# Patient Record
Sex: Female | Born: 1945 | Race: White | Marital: Single | Smoking: Never smoker
Health system: Southern US, Community
[De-identification: ages and names within clinical notes are randomized; demographics above are authoritative.]

## PROBLEM LIST (undated history)

## (undated) DIAGNOSIS — Z808 Family history of malignant neoplasm of other organs or systems: Secondary | ICD-10-CM

## (undated) DIAGNOSIS — Z801 Family history of malignant neoplasm of trachea, bronchus and lung: Secondary | ICD-10-CM

## (undated) DIAGNOSIS — E079 Disorder of thyroid, unspecified: Secondary | ICD-10-CM

## (undated) DIAGNOSIS — C20 Malignant neoplasm of rectum: Secondary | ICD-10-CM

## (undated) DIAGNOSIS — Z800 Family history of malignant neoplasm of digestive organs: Secondary | ICD-10-CM

## (undated) HISTORY — DX: Family history of malignant neoplasm of other organs or systems: Z80.8

## (undated) HISTORY — DX: Family history of malignant neoplasm of digestive organs: Z80.0

## (undated) HISTORY — DX: Disorder of thyroid, unspecified: E07.9

## (undated) HISTORY — PX: TONSILLECTOMY: SUR1361

## (undated) HISTORY — DX: Family history of malignant neoplasm of trachea, bronchus and lung: Z80.1

---

## 2015-09-23 DIAGNOSIS — E039 Hypothyroidism, unspecified: Secondary | ICD-10-CM | POA: Diagnosis not present

## 2015-09-23 DIAGNOSIS — Z79899 Other long term (current) drug therapy: Secondary | ICD-10-CM | POA: Diagnosis not present

## 2015-09-28 DIAGNOSIS — M545 Low back pain: Secondary | ICD-10-CM | POA: Diagnosis not present

## 2015-09-28 DIAGNOSIS — M9902 Segmental and somatic dysfunction of thoracic region: Secondary | ICD-10-CM | POA: Diagnosis not present

## 2015-09-28 DIAGNOSIS — M542 Cervicalgia: Secondary | ICD-10-CM | POA: Diagnosis not present

## 2015-09-28 DIAGNOSIS — M9903 Segmental and somatic dysfunction of lumbar region: Secondary | ICD-10-CM | POA: Diagnosis not present

## 2015-09-28 DIAGNOSIS — M9901 Segmental and somatic dysfunction of cervical region: Secondary | ICD-10-CM | POA: Diagnosis not present

## 2015-09-28 DIAGNOSIS — M546 Pain in thoracic spine: Secondary | ICD-10-CM | POA: Diagnosis not present

## 2015-09-30 DIAGNOSIS — M9902 Segmental and somatic dysfunction of thoracic region: Secondary | ICD-10-CM | POA: Diagnosis not present

## 2015-09-30 DIAGNOSIS — M545 Low back pain: Secondary | ICD-10-CM | POA: Diagnosis not present

## 2015-09-30 DIAGNOSIS — M9903 Segmental and somatic dysfunction of lumbar region: Secondary | ICD-10-CM | POA: Diagnosis not present

## 2015-09-30 DIAGNOSIS — M546 Pain in thoracic spine: Secondary | ICD-10-CM | POA: Diagnosis not present

## 2015-09-30 DIAGNOSIS — E039 Hypothyroidism, unspecified: Secondary | ICD-10-CM | POA: Diagnosis not present

## 2015-09-30 DIAGNOSIS — M542 Cervicalgia: Secondary | ICD-10-CM | POA: Diagnosis not present

## 2015-09-30 DIAGNOSIS — M9901 Segmental and somatic dysfunction of cervical region: Secondary | ICD-10-CM | POA: Diagnosis not present

## 2015-10-05 DIAGNOSIS — M9901 Segmental and somatic dysfunction of cervical region: Secondary | ICD-10-CM | POA: Diagnosis not present

## 2015-10-05 DIAGNOSIS — M542 Cervicalgia: Secondary | ICD-10-CM | POA: Diagnosis not present

## 2015-10-05 DIAGNOSIS — M545 Low back pain: Secondary | ICD-10-CM | POA: Diagnosis not present

## 2015-10-05 DIAGNOSIS — M9903 Segmental and somatic dysfunction of lumbar region: Secondary | ICD-10-CM | POA: Diagnosis not present

## 2015-10-05 DIAGNOSIS — M546 Pain in thoracic spine: Secondary | ICD-10-CM | POA: Diagnosis not present

## 2015-10-05 DIAGNOSIS — M9902 Segmental and somatic dysfunction of thoracic region: Secondary | ICD-10-CM | POA: Diagnosis not present

## 2015-10-07 DIAGNOSIS — M9903 Segmental and somatic dysfunction of lumbar region: Secondary | ICD-10-CM | POA: Diagnosis not present

## 2015-10-07 DIAGNOSIS — M9901 Segmental and somatic dysfunction of cervical region: Secondary | ICD-10-CM | POA: Diagnosis not present

## 2015-10-07 DIAGNOSIS — M546 Pain in thoracic spine: Secondary | ICD-10-CM | POA: Diagnosis not present

## 2015-10-07 DIAGNOSIS — M542 Cervicalgia: Secondary | ICD-10-CM | POA: Diagnosis not present

## 2015-10-07 DIAGNOSIS — M9902 Segmental and somatic dysfunction of thoracic region: Secondary | ICD-10-CM | POA: Diagnosis not present

## 2015-10-07 DIAGNOSIS — M545 Low back pain: Secondary | ICD-10-CM | POA: Diagnosis not present

## 2015-10-12 DIAGNOSIS — M9901 Segmental and somatic dysfunction of cervical region: Secondary | ICD-10-CM | POA: Diagnosis not present

## 2015-10-12 DIAGNOSIS — M9902 Segmental and somatic dysfunction of thoracic region: Secondary | ICD-10-CM | POA: Diagnosis not present

## 2015-10-12 DIAGNOSIS — M545 Low back pain: Secondary | ICD-10-CM | POA: Diagnosis not present

## 2015-10-12 DIAGNOSIS — M546 Pain in thoracic spine: Secondary | ICD-10-CM | POA: Diagnosis not present

## 2015-10-12 DIAGNOSIS — M542 Cervicalgia: Secondary | ICD-10-CM | POA: Diagnosis not present

## 2015-10-12 DIAGNOSIS — M9903 Segmental and somatic dysfunction of lumbar region: Secondary | ICD-10-CM | POA: Diagnosis not present

## 2015-10-14 DIAGNOSIS — M546 Pain in thoracic spine: Secondary | ICD-10-CM | POA: Diagnosis not present

## 2015-10-14 DIAGNOSIS — M9902 Segmental and somatic dysfunction of thoracic region: Secondary | ICD-10-CM | POA: Diagnosis not present

## 2015-10-14 DIAGNOSIS — M542 Cervicalgia: Secondary | ICD-10-CM | POA: Diagnosis not present

## 2015-10-14 DIAGNOSIS — M9903 Segmental and somatic dysfunction of lumbar region: Secondary | ICD-10-CM | POA: Diagnosis not present

## 2015-10-14 DIAGNOSIS — M545 Low back pain: Secondary | ICD-10-CM | POA: Diagnosis not present

## 2015-10-14 DIAGNOSIS — M9901 Segmental and somatic dysfunction of cervical region: Secondary | ICD-10-CM | POA: Diagnosis not present

## 2015-10-19 DIAGNOSIS — M542 Cervicalgia: Secondary | ICD-10-CM | POA: Diagnosis not present

## 2015-10-19 DIAGNOSIS — M546 Pain in thoracic spine: Secondary | ICD-10-CM | POA: Diagnosis not present

## 2015-10-19 DIAGNOSIS — M9901 Segmental and somatic dysfunction of cervical region: Secondary | ICD-10-CM | POA: Diagnosis not present

## 2015-10-19 DIAGNOSIS — M545 Low back pain: Secondary | ICD-10-CM | POA: Diagnosis not present

## 2015-10-19 DIAGNOSIS — M9902 Segmental and somatic dysfunction of thoracic region: Secondary | ICD-10-CM | POA: Diagnosis not present

## 2015-10-19 DIAGNOSIS — M9903 Segmental and somatic dysfunction of lumbar region: Secondary | ICD-10-CM | POA: Diagnosis not present

## 2015-10-21 DIAGNOSIS — M9903 Segmental and somatic dysfunction of lumbar region: Secondary | ICD-10-CM | POA: Diagnosis not present

## 2015-10-21 DIAGNOSIS — M9902 Segmental and somatic dysfunction of thoracic region: Secondary | ICD-10-CM | POA: Diagnosis not present

## 2015-10-21 DIAGNOSIS — M545 Low back pain: Secondary | ICD-10-CM | POA: Diagnosis not present

## 2015-10-21 DIAGNOSIS — M542 Cervicalgia: Secondary | ICD-10-CM | POA: Diagnosis not present

## 2015-10-21 DIAGNOSIS — M9901 Segmental and somatic dysfunction of cervical region: Secondary | ICD-10-CM | POA: Diagnosis not present

## 2015-10-21 DIAGNOSIS — M546 Pain in thoracic spine: Secondary | ICD-10-CM | POA: Diagnosis not present

## 2015-10-25 DIAGNOSIS — M9903 Segmental and somatic dysfunction of lumbar region: Secondary | ICD-10-CM | POA: Diagnosis not present

## 2015-10-25 DIAGNOSIS — M545 Low back pain: Secondary | ICD-10-CM | POA: Diagnosis not present

## 2015-10-25 DIAGNOSIS — M9902 Segmental and somatic dysfunction of thoracic region: Secondary | ICD-10-CM | POA: Diagnosis not present

## 2015-10-25 DIAGNOSIS — M542 Cervicalgia: Secondary | ICD-10-CM | POA: Diagnosis not present

## 2015-10-25 DIAGNOSIS — M546 Pain in thoracic spine: Secondary | ICD-10-CM | POA: Diagnosis not present

## 2015-10-25 DIAGNOSIS — M9901 Segmental and somatic dysfunction of cervical region: Secondary | ICD-10-CM | POA: Diagnosis not present

## 2015-11-02 DIAGNOSIS — M545 Low back pain: Secondary | ICD-10-CM | POA: Diagnosis not present

## 2015-11-02 DIAGNOSIS — M9903 Segmental and somatic dysfunction of lumbar region: Secondary | ICD-10-CM | POA: Diagnosis not present

## 2015-11-02 DIAGNOSIS — M9901 Segmental and somatic dysfunction of cervical region: Secondary | ICD-10-CM | POA: Diagnosis not present

## 2015-11-02 DIAGNOSIS — M546 Pain in thoracic spine: Secondary | ICD-10-CM | POA: Diagnosis not present

## 2015-11-02 DIAGNOSIS — M542 Cervicalgia: Secondary | ICD-10-CM | POA: Diagnosis not present

## 2015-11-02 DIAGNOSIS — M9902 Segmental and somatic dysfunction of thoracic region: Secondary | ICD-10-CM | POA: Diagnosis not present

## 2015-11-04 DIAGNOSIS — M542 Cervicalgia: Secondary | ICD-10-CM | POA: Diagnosis not present

## 2015-11-04 DIAGNOSIS — M546 Pain in thoracic spine: Secondary | ICD-10-CM | POA: Diagnosis not present

## 2015-11-04 DIAGNOSIS — M9901 Segmental and somatic dysfunction of cervical region: Secondary | ICD-10-CM | POA: Diagnosis not present

## 2015-11-04 DIAGNOSIS — M9903 Segmental and somatic dysfunction of lumbar region: Secondary | ICD-10-CM | POA: Diagnosis not present

## 2015-11-04 DIAGNOSIS — M545 Low back pain: Secondary | ICD-10-CM | POA: Diagnosis not present

## 2015-11-04 DIAGNOSIS — M9902 Segmental and somatic dysfunction of thoracic region: Secondary | ICD-10-CM | POA: Diagnosis not present

## 2015-11-09 DIAGNOSIS — M546 Pain in thoracic spine: Secondary | ICD-10-CM | POA: Diagnosis not present

## 2015-11-09 DIAGNOSIS — M9902 Segmental and somatic dysfunction of thoracic region: Secondary | ICD-10-CM | POA: Diagnosis not present

## 2015-11-09 DIAGNOSIS — M542 Cervicalgia: Secondary | ICD-10-CM | POA: Diagnosis not present

## 2015-11-09 DIAGNOSIS — M545 Low back pain: Secondary | ICD-10-CM | POA: Diagnosis not present

## 2015-11-09 DIAGNOSIS — M9901 Segmental and somatic dysfunction of cervical region: Secondary | ICD-10-CM | POA: Diagnosis not present

## 2015-11-09 DIAGNOSIS — M9903 Segmental and somatic dysfunction of lumbar region: Secondary | ICD-10-CM | POA: Diagnosis not present

## 2015-11-16 DIAGNOSIS — M542 Cervicalgia: Secondary | ICD-10-CM | POA: Diagnosis not present

## 2015-11-16 DIAGNOSIS — M9902 Segmental and somatic dysfunction of thoracic region: Secondary | ICD-10-CM | POA: Diagnosis not present

## 2015-11-16 DIAGNOSIS — M546 Pain in thoracic spine: Secondary | ICD-10-CM | POA: Diagnosis not present

## 2015-11-16 DIAGNOSIS — M9903 Segmental and somatic dysfunction of lumbar region: Secondary | ICD-10-CM | POA: Diagnosis not present

## 2015-11-16 DIAGNOSIS — M9901 Segmental and somatic dysfunction of cervical region: Secondary | ICD-10-CM | POA: Diagnosis not present

## 2015-11-16 DIAGNOSIS — M545 Low back pain: Secondary | ICD-10-CM | POA: Diagnosis not present

## 2015-11-23 DIAGNOSIS — M542 Cervicalgia: Secondary | ICD-10-CM | POA: Diagnosis not present

## 2015-11-23 DIAGNOSIS — M9903 Segmental and somatic dysfunction of lumbar region: Secondary | ICD-10-CM | POA: Diagnosis not present

## 2015-11-23 DIAGNOSIS — M9901 Segmental and somatic dysfunction of cervical region: Secondary | ICD-10-CM | POA: Diagnosis not present

## 2015-11-23 DIAGNOSIS — M545 Low back pain: Secondary | ICD-10-CM | POA: Diagnosis not present

## 2015-11-23 DIAGNOSIS — M546 Pain in thoracic spine: Secondary | ICD-10-CM | POA: Diagnosis not present

## 2015-11-23 DIAGNOSIS — M9902 Segmental and somatic dysfunction of thoracic region: Secondary | ICD-10-CM | POA: Diagnosis not present

## 2015-12-02 DIAGNOSIS — M545 Low back pain: Secondary | ICD-10-CM | POA: Diagnosis not present

## 2015-12-02 DIAGNOSIS — M9903 Segmental and somatic dysfunction of lumbar region: Secondary | ICD-10-CM | POA: Diagnosis not present

## 2015-12-02 DIAGNOSIS — M546 Pain in thoracic spine: Secondary | ICD-10-CM | POA: Diagnosis not present

## 2015-12-02 DIAGNOSIS — M9901 Segmental and somatic dysfunction of cervical region: Secondary | ICD-10-CM | POA: Diagnosis not present

## 2015-12-02 DIAGNOSIS — M542 Cervicalgia: Secondary | ICD-10-CM | POA: Diagnosis not present

## 2015-12-02 DIAGNOSIS — M9902 Segmental and somatic dysfunction of thoracic region: Secondary | ICD-10-CM | POA: Diagnosis not present

## 2015-12-09 DIAGNOSIS — M9901 Segmental and somatic dysfunction of cervical region: Secondary | ICD-10-CM | POA: Diagnosis not present

## 2015-12-09 DIAGNOSIS — M542 Cervicalgia: Secondary | ICD-10-CM | POA: Diagnosis not present

## 2015-12-16 DIAGNOSIS — M9902 Segmental and somatic dysfunction of thoracic region: Secondary | ICD-10-CM | POA: Diagnosis not present

## 2015-12-16 DIAGNOSIS — M542 Cervicalgia: Secondary | ICD-10-CM | POA: Diagnosis not present

## 2015-12-16 DIAGNOSIS — M9901 Segmental and somatic dysfunction of cervical region: Secondary | ICD-10-CM | POA: Diagnosis not present

## 2015-12-16 DIAGNOSIS — M546 Pain in thoracic spine: Secondary | ICD-10-CM | POA: Diagnosis not present

## 2015-12-30 DIAGNOSIS — M546 Pain in thoracic spine: Secondary | ICD-10-CM | POA: Diagnosis not present

## 2015-12-30 DIAGNOSIS — M542 Cervicalgia: Secondary | ICD-10-CM | POA: Diagnosis not present

## 2015-12-30 DIAGNOSIS — M9901 Segmental and somatic dysfunction of cervical region: Secondary | ICD-10-CM | POA: Diagnosis not present

## 2015-12-30 DIAGNOSIS — M9902 Segmental and somatic dysfunction of thoracic region: Secondary | ICD-10-CM | POA: Diagnosis not present

## 2016-01-04 DIAGNOSIS — E039 Hypothyroidism, unspecified: Secondary | ICD-10-CM | POA: Diagnosis not present

## 2016-01-04 DIAGNOSIS — L309 Dermatitis, unspecified: Secondary | ICD-10-CM | POA: Diagnosis not present

## 2016-01-04 DIAGNOSIS — Z23 Encounter for immunization: Secondary | ICD-10-CM | POA: Diagnosis not present

## 2016-01-04 DIAGNOSIS — E559 Vitamin D deficiency, unspecified: Secondary | ICD-10-CM | POA: Diagnosis not present

## 2016-01-10 DIAGNOSIS — M546 Pain in thoracic spine: Secondary | ICD-10-CM | POA: Diagnosis not present

## 2016-01-10 DIAGNOSIS — M9902 Segmental and somatic dysfunction of thoracic region: Secondary | ICD-10-CM | POA: Diagnosis not present

## 2016-01-10 DIAGNOSIS — M9901 Segmental and somatic dysfunction of cervical region: Secondary | ICD-10-CM | POA: Diagnosis not present

## 2016-01-10 DIAGNOSIS — M542 Cervicalgia: Secondary | ICD-10-CM | POA: Diagnosis not present

## 2016-01-25 DIAGNOSIS — M542 Cervicalgia: Secondary | ICD-10-CM | POA: Diagnosis not present

## 2016-01-25 DIAGNOSIS — M546 Pain in thoracic spine: Secondary | ICD-10-CM | POA: Diagnosis not present

## 2016-01-25 DIAGNOSIS — M9901 Segmental and somatic dysfunction of cervical region: Secondary | ICD-10-CM | POA: Diagnosis not present

## 2016-01-25 DIAGNOSIS — M9902 Segmental and somatic dysfunction of thoracic region: Secondary | ICD-10-CM | POA: Diagnosis not present

## 2016-02-08 DIAGNOSIS — M9901 Segmental and somatic dysfunction of cervical region: Secondary | ICD-10-CM | POA: Diagnosis not present

## 2016-02-08 DIAGNOSIS — M542 Cervicalgia: Secondary | ICD-10-CM | POA: Diagnosis not present

## 2016-02-08 DIAGNOSIS — M9902 Segmental and somatic dysfunction of thoracic region: Secondary | ICD-10-CM | POA: Diagnosis not present

## 2016-02-08 DIAGNOSIS — M546 Pain in thoracic spine: Secondary | ICD-10-CM | POA: Diagnosis not present

## 2016-02-24 DIAGNOSIS — M9901 Segmental and somatic dysfunction of cervical region: Secondary | ICD-10-CM | POA: Diagnosis not present

## 2016-02-24 DIAGNOSIS — M542 Cervicalgia: Secondary | ICD-10-CM | POA: Diagnosis not present

## 2016-02-24 DIAGNOSIS — M546 Pain in thoracic spine: Secondary | ICD-10-CM | POA: Diagnosis not present

## 2016-02-24 DIAGNOSIS — M9902 Segmental and somatic dysfunction of thoracic region: Secondary | ICD-10-CM | POA: Diagnosis not present

## 2016-06-27 DIAGNOSIS — E890 Postprocedural hypothyroidism: Secondary | ICD-10-CM | POA: Diagnosis not present

## 2016-06-27 DIAGNOSIS — Z0000 Encounter for general adult medical examination without abnormal findings: Secondary | ICD-10-CM | POA: Diagnosis not present

## 2016-06-28 DIAGNOSIS — M9901 Segmental and somatic dysfunction of cervical region: Secondary | ICD-10-CM | POA: Diagnosis not present

## 2016-06-28 DIAGNOSIS — M542 Cervicalgia: Secondary | ICD-10-CM | POA: Diagnosis not present

## 2016-06-28 DIAGNOSIS — M546 Pain in thoracic spine: Secondary | ICD-10-CM | POA: Diagnosis not present

## 2016-06-28 DIAGNOSIS — M9902 Segmental and somatic dysfunction of thoracic region: Secondary | ICD-10-CM | POA: Diagnosis not present

## 2016-11-20 DIAGNOSIS — Z23 Encounter for immunization: Secondary | ICD-10-CM | POA: Diagnosis not present

## 2016-11-20 DIAGNOSIS — E890 Postprocedural hypothyroidism: Secondary | ICD-10-CM | POA: Diagnosis not present

## 2016-11-30 DIAGNOSIS — Z0100 Encounter for examination of eyes and vision without abnormal findings: Secondary | ICD-10-CM | POA: Diagnosis not present

## 2016-11-30 DIAGNOSIS — H5213 Myopia, bilateral: Secondary | ICD-10-CM | POA: Diagnosis not present

## 2018-08-08 DIAGNOSIS — R5383 Other fatigue: Secondary | ICD-10-CM | POA: Diagnosis not present

## 2018-08-08 DIAGNOSIS — E559 Vitamin D deficiency, unspecified: Secondary | ICD-10-CM | POA: Diagnosis not present

## 2018-08-08 DIAGNOSIS — E039 Hypothyroidism, unspecified: Secondary | ICD-10-CM | POA: Diagnosis not present

## 2018-08-08 DIAGNOSIS — K529 Noninfective gastroenteritis and colitis, unspecified: Secondary | ICD-10-CM | POA: Diagnosis not present

## 2018-08-12 DIAGNOSIS — L821 Other seborrheic keratosis: Secondary | ICD-10-CM | POA: Diagnosis not present

## 2018-08-14 DIAGNOSIS — R197 Diarrhea, unspecified: Secondary | ICD-10-CM | POA: Diagnosis not present

## 2018-09-03 DIAGNOSIS — E039 Hypothyroidism, unspecified: Secondary | ICD-10-CM | POA: Diagnosis not present

## 2018-09-03 DIAGNOSIS — K529 Noninfective gastroenteritis and colitis, unspecified: Secondary | ICD-10-CM | POA: Diagnosis not present

## 2018-09-03 DIAGNOSIS — L821 Other seborrheic keratosis: Secondary | ICD-10-CM | POA: Diagnosis not present

## 2018-09-03 DIAGNOSIS — E559 Vitamin D deficiency, unspecified: Secondary | ICD-10-CM | POA: Diagnosis not present

## 2018-11-15 DIAGNOSIS — E559 Vitamin D deficiency, unspecified: Secondary | ICD-10-CM | POA: Diagnosis not present

## 2018-11-15 DIAGNOSIS — I10 Essential (primary) hypertension: Secondary | ICD-10-CM | POA: Diagnosis not present

## 2018-11-15 DIAGNOSIS — K529 Noninfective gastroenteritis and colitis, unspecified: Secondary | ICD-10-CM | POA: Diagnosis not present

## 2018-11-15 DIAGNOSIS — H269 Unspecified cataract: Secondary | ICD-10-CM | POA: Diagnosis not present

## 2018-11-15 DIAGNOSIS — E039 Hypothyroidism, unspecified: Secondary | ICD-10-CM | POA: Diagnosis not present

## 2018-11-15 DIAGNOSIS — K625 Hemorrhage of anus and rectum: Secondary | ICD-10-CM | POA: Diagnosis not present

## 2019-02-04 DIAGNOSIS — R634 Abnormal weight loss: Secondary | ICD-10-CM | POA: Diagnosis not present

## 2019-02-04 DIAGNOSIS — R109 Unspecified abdominal pain: Secondary | ICD-10-CM | POA: Diagnosis not present

## 2019-02-04 DIAGNOSIS — E039 Hypothyroidism, unspecified: Secondary | ICD-10-CM | POA: Diagnosis not present

## 2019-02-04 DIAGNOSIS — R7301 Impaired fasting glucose: Secondary | ICD-10-CM | POA: Diagnosis not present

## 2019-02-04 DIAGNOSIS — R1030 Lower abdominal pain, unspecified: Secondary | ICD-10-CM | POA: Diagnosis not present

## 2019-02-04 DIAGNOSIS — E785 Hyperlipidemia, unspecified: Secondary | ICD-10-CM | POA: Diagnosis not present

## 2019-02-06 ENCOUNTER — Other Ambulatory Visit: Payer: Self-pay | Admitting: Family Medicine

## 2019-02-06 DIAGNOSIS — R109 Unspecified abdominal pain: Secondary | ICD-10-CM

## 2019-02-07 ENCOUNTER — Ambulatory Visit
Admission: RE | Admit: 2019-02-07 | Discharge: 2019-02-07 | Disposition: A | Discharge status: Home / Self Care | Payer: PPO | Source: Ambulatory Visit | Attending: Family Medicine | Admitting: Family Medicine

## 2019-02-07 DIAGNOSIS — R109 Unspecified abdominal pain: Secondary | ICD-10-CM

## 2019-02-07 DIAGNOSIS — K6389 Other specified diseases of intestine: Secondary | ICD-10-CM | POA: Diagnosis not present

## 2019-02-07 MED ORDER — IOHEXOL 300 MG/ML  SOLN
100.0000 mL | Freq: Once | INTRAMUSCULAR | Status: AC | PRN
  Administered 2019-02-07: 100 mL via INTRAVENOUS

## 2019-02-10 DIAGNOSIS — R195 Other fecal abnormalities: Secondary | ICD-10-CM | POA: Diagnosis not present

## 2019-02-10 DIAGNOSIS — R634 Abnormal weight loss: Secondary | ICD-10-CM | POA: Diagnosis not present

## 2019-02-10 DIAGNOSIS — D509 Iron deficiency anemia, unspecified: Secondary | ICD-10-CM | POA: Diagnosis not present

## 2019-02-10 DIAGNOSIS — R1030 Lower abdominal pain, unspecified: Secondary | ICD-10-CM | POA: Diagnosis not present

## 2019-02-10 DIAGNOSIS — R778 Other specified abnormalities of plasma proteins: Secondary | ICD-10-CM | POA: Diagnosis not present

## 2019-02-10 DIAGNOSIS — K660 Peritoneal adhesions (postprocedural) (postinfection): Secondary | ICD-10-CM | POA: Diagnosis not present

## 2019-02-10 DIAGNOSIS — R197 Diarrhea, unspecified: Secondary | ICD-10-CM | POA: Diagnosis not present

## 2019-02-10 DIAGNOSIS — R109 Unspecified abdominal pain: Secondary | ICD-10-CM | POA: Diagnosis not present

## 2019-02-10 DIAGNOSIS — R933 Abnormal findings on diagnostic imaging of other parts of digestive tract: Secondary | ICD-10-CM | POA: Diagnosis not present

## 2019-02-11 DIAGNOSIS — C187 Malignant neoplasm of sigmoid colon: Secondary | ICD-10-CM | POA: Diagnosis not present

## 2019-02-11 DIAGNOSIS — R195 Other fecal abnormalities: Secondary | ICD-10-CM | POA: Diagnosis not present

## 2019-02-11 DIAGNOSIS — R933 Abnormal findings on diagnostic imaging of other parts of digestive tract: Secondary | ICD-10-CM | POA: Diagnosis not present

## 2019-02-11 DIAGNOSIS — R197 Diarrhea, unspecified: Secondary | ICD-10-CM | POA: Diagnosis not present

## 2019-02-11 DIAGNOSIS — K922 Gastrointestinal hemorrhage, unspecified: Secondary | ICD-10-CM | POA: Diagnosis not present

## 2019-02-11 DIAGNOSIS — C19 Malignant neoplasm of rectosigmoid junction: Secondary | ICD-10-CM | POA: Diagnosis not present

## 2019-02-14 ENCOUNTER — Other Ambulatory Visit: Payer: Self-pay | Admitting: Surgery

## 2019-02-14 DIAGNOSIS — C189 Malignant neoplasm of colon, unspecified: Secondary | ICD-10-CM

## 2019-02-17 ENCOUNTER — Ambulatory Visit
Admission: RE | Admit: 2019-02-17 | Discharge: 2019-02-17 | Disposition: A | Discharge status: Home / Self Care | Payer: PPO | Source: Ambulatory Visit | Attending: Surgery | Admitting: Surgery

## 2019-02-17 ENCOUNTER — Other Ambulatory Visit: Payer: Self-pay

## 2019-02-17 DIAGNOSIS — C189 Malignant neoplasm of colon, unspecified: Secondary | ICD-10-CM | POA: Diagnosis not present

## 2019-02-17 MED ORDER — IOPAMIDOL (ISOVUE-300) INJECTION 61%
75.0000 mL | Freq: Once | INTRAVENOUS | Status: AC | PRN
  Administered 2019-02-17: 75 mL via INTRAVENOUS

## 2019-02-18 DIAGNOSIS — C20 Malignant neoplasm of rectum: Secondary | ICD-10-CM | POA: Diagnosis not present

## 2019-02-20 ENCOUNTER — Inpatient Hospital Stay (HOSPITAL_COMMUNITY)
Admission: EM | Admit: 2019-02-20 | Discharge: 2019-03-05 | DRG: 329 | Disposition: A | Discharge status: Home Health | Payer: PPO | Attending: Surgery | Admitting: Surgery

## 2019-02-20 ENCOUNTER — Encounter (HOSPITAL_COMMUNITY): Admission: EM | Disposition: A | Discharge status: Home Health | Payer: Self-pay | Source: Home / Self Care

## 2019-02-20 ENCOUNTER — Encounter (HOSPITAL_COMMUNITY): Payer: Self-pay | Admitting: *Deleted

## 2019-02-20 ENCOUNTER — Other Ambulatory Visit: Payer: Self-pay | Admitting: Surgery

## 2019-02-20 ENCOUNTER — Emergency Department (HOSPITAL_COMMUNITY): Payer: PPO

## 2019-02-20 ENCOUNTER — Emergency Department (HOSPITAL_COMMUNITY): Payer: PPO | Admitting: Registered Nurse

## 2019-02-20 ENCOUNTER — Other Ambulatory Visit: Payer: Self-pay

## 2019-02-20 ENCOUNTER — Telehealth: Payer: Self-pay | Admitting: Hematology

## 2019-02-20 DIAGNOSIS — E039 Hypothyroidism, unspecified: Secondary | ICD-10-CM | POA: Diagnosis not present

## 2019-02-20 DIAGNOSIS — R52 Pain, unspecified: Secondary | ICD-10-CM | POA: Diagnosis not present

## 2019-02-20 DIAGNOSIS — C20 Malignant neoplasm of rectum: Principal | ICD-10-CM | POA: Diagnosis present

## 2019-02-20 DIAGNOSIS — Z6820 Body mass index (BMI) 20.0-20.9, adult: Secondary | ICD-10-CM

## 2019-02-20 DIAGNOSIS — Z800 Family history of malignant neoplasm of digestive organs: Secondary | ICD-10-CM | POA: Diagnosis not present

## 2019-02-20 DIAGNOSIS — I959 Hypotension, unspecified: Secondary | ICD-10-CM | POA: Diagnosis not present

## 2019-02-20 DIAGNOSIS — K659 Peritonitis, unspecified: Secondary | ICD-10-CM | POA: Diagnosis not present

## 2019-02-20 DIAGNOSIS — E43 Unspecified severe protein-calorie malnutrition: Secondary | ICD-10-CM | POA: Diagnosis not present

## 2019-02-20 DIAGNOSIS — E860 Dehydration: Secondary | ICD-10-CM | POA: Diagnosis present

## 2019-02-20 DIAGNOSIS — R1310 Dysphagia, unspecified: Secondary | ICD-10-CM | POA: Diagnosis present

## 2019-02-20 DIAGNOSIS — E871 Hypo-osmolality and hyponatremia: Secondary | ICD-10-CM | POA: Diagnosis not present

## 2019-02-20 DIAGNOSIS — Z433 Encounter for attention to colostomy: Secondary | ICD-10-CM | POA: Diagnosis not present

## 2019-02-20 DIAGNOSIS — K631 Perforation of intestine (nontraumatic): Secondary | ICD-10-CM | POA: Diagnosis not present

## 2019-02-20 DIAGNOSIS — C188 Malignant neoplasm of overlapping sites of colon: Secondary | ICD-10-CM | POA: Diagnosis not present

## 2019-02-20 DIAGNOSIS — K567 Ileus, unspecified: Secondary | ICD-10-CM | POA: Diagnosis not present

## 2019-02-20 DIAGNOSIS — R198 Other specified symptoms and signs involving the digestive system and abdomen: Secondary | ICD-10-CM | POA: Diagnosis present

## 2019-02-20 DIAGNOSIS — K668 Other specified disorders of peritoneum: Secondary | ICD-10-CM | POA: Diagnosis not present

## 2019-02-20 DIAGNOSIS — R1084 Generalized abdominal pain: Secondary | ICD-10-CM | POA: Diagnosis not present

## 2019-02-20 DIAGNOSIS — R000 Tachycardia, unspecified: Secondary | ICD-10-CM | POA: Diagnosis not present

## 2019-02-20 HISTORY — PX: LAPAROTOMY: SHX154

## 2019-02-20 HISTORY — PX: COLECTOMY WITH COLOSTOMY CREATION/HARTMANN PROCEDURE: SHX6598

## 2019-02-20 LAB — CBC WITH DIFFERENTIAL/PLATELET
Abs Immature Granulocytes: 0.16 10*3/uL — ABNORMAL HIGH (ref 0.00–0.07)
Basophils Absolute: 0.1 10*3/uL (ref 0.0–0.1)
Basophils Relative: 1 %
EOS PCT: 17 %
Eosinophils Absolute: 2.1 10*3/uL — ABNORMAL HIGH (ref 0.0–0.5)
HCT: 35.5 % — ABNORMAL LOW (ref 36.0–46.0)
Hemoglobin: 10.7 g/dL — ABNORMAL LOW (ref 12.0–15.0)
Immature Granulocytes: 1 %
Lymphocytes Relative: 3 %
Lymphs Abs: 0.4 10*3/uL — ABNORMAL LOW (ref 0.7–4.0)
MCH: 24.1 pg — ABNORMAL LOW (ref 26.0–34.0)
MCHC: 30.1 g/dL (ref 30.0–36.0)
MCV: 80 fL (ref 80.0–100.0)
Monocytes Absolute: 0.5 10*3/uL (ref 0.1–1.0)
Monocytes Relative: 4 %
Neutro Abs: 9.1 10*3/uL — ABNORMAL HIGH (ref 1.7–7.7)
Neutrophils Relative %: 74 %
Platelets: 422 10*3/uL — ABNORMAL HIGH (ref 150–400)
RBC: 4.44 MIL/uL (ref 3.87–5.11)
RDW: 14.7 % (ref 11.5–15.5)
WBC: 12.4 10*3/uL — ABNORMAL HIGH (ref 4.0–10.5)
nRBC: 0 % (ref 0.0–0.2)

## 2019-02-20 LAB — COMPREHENSIVE METABOLIC PANEL
ALT: 13 U/L (ref 0–44)
AST: 21 U/L (ref 15–41)
Albumin: 2.6 g/dL — ABNORMAL LOW (ref 3.5–5.0)
Alkaline Phosphatase: 84 U/L (ref 38–126)
Anion gap: 9 (ref 5–15)
BUN: 13 mg/dL (ref 8–23)
CO2: 24 mmol/L (ref 22–32)
Calcium: 7.9 mg/dL — ABNORMAL LOW (ref 8.9–10.3)
Chloride: 101 mmol/L (ref 98–111)
Creatinine, Ser: 0.64 mg/dL (ref 0.44–1.00)
GFR calc Af Amer: >60 mL/min (ref >60)
GFR calc non Af Amer: >60 mL/min (ref >60)
Glucose, Bld: 101 mg/dL — ABNORMAL HIGH (ref 70–99)
Potassium: 3.5 mmol/L (ref 3.5–5.1)
Sodium: 134 mmol/L — ABNORMAL LOW (ref 135–145)
Total Bilirubin: 1.4 mg/dL — ABNORMAL HIGH (ref 0.3–1.2)
Total Protein: 6.1 g/dL — ABNORMAL LOW (ref 6.5–8.1)

## 2019-02-20 LAB — TYPE AND SCREEN
ABO/RH(D): A POS
ANTIBODY SCREEN: NEGATIVE

## 2019-02-20 LAB — LACTIC ACID, PLASMA: Lactic Acid, Venous: 1.5 mmol/L (ref 0.5–1.9)

## 2019-02-20 LAB — LIPASE, BLOOD: Lipase: 24 U/L (ref 11–51)

## 2019-02-20 SURGERY — LAPAROTOMY, EXPLORATORY
Anesthesia: General | Site: Abdomen

## 2019-02-20 MED ORDER — SODIUM CHLORIDE 0.9 % IV SOLN
INTRAVENOUS | Status: DC | PRN
  Administered 2019-02-20: 25 ug/min via INTRAVENOUS

## 2019-02-20 MED ORDER — BUPIVACAINE-EPINEPHRINE (PF) 0.25% -1:200000 IJ SOLN
INTRAMUSCULAR | Status: AC
  Filled 2019-02-20: qty 30

## 2019-02-20 MED ORDER — ALBUMIN HUMAN 5 % IV SOLN
INTRAVENOUS | Status: DC | PRN
  Administered 2019-02-20: 23:00:00 via INTRAVENOUS

## 2019-02-20 MED ORDER — DEXAMETHASONE SODIUM PHOSPHATE 10 MG/ML IJ SOLN
INTRAMUSCULAR | Status: DC | PRN
  Administered 2019-02-20: 5 mg via INTRAVENOUS

## 2019-02-20 MED ORDER — FENTANYL CITRATE (PF) 250 MCG/5ML IJ SOLN
INTRAMUSCULAR | Status: AC
  Filled 2019-02-20: qty 5

## 2019-02-20 MED ORDER — SODIUM CHLORIDE (PF) 0.9 % IJ SOLN
INTRAMUSCULAR | Status: AC
  Filled 2019-02-20: qty 50

## 2019-02-20 MED ORDER — FENTANYL CITRATE (PF) 100 MCG/2ML IJ SOLN
50.0000 ug | Freq: Once | INTRAMUSCULAR | Status: AC
  Administered 2019-02-20: 50 ug via INTRAVENOUS
  Filled 2019-02-20: qty 2

## 2019-02-20 MED ORDER — IOPAMIDOL (ISOVUE-300) INJECTION 61%
INTRAVENOUS | Status: AC
  Filled 2019-02-20: qty 100

## 2019-02-20 MED ORDER — DEXAMETHASONE SODIUM PHOSPHATE 10 MG/ML IJ SOLN
INTRAMUSCULAR | Status: AC
  Filled 2019-02-20: qty 1

## 2019-02-20 MED ORDER — LACTATED RINGERS IV SOLN
INTRAVENOUS | Status: DC | PRN
  Administered 2019-02-20: 21:00:00 via INTRAVENOUS

## 2019-02-20 MED ORDER — SODIUM CHLORIDE 0.9% IV SOLUTION: Freq: Once | INTRAVENOUS | Status: DC

## 2019-02-20 MED ORDER — ACETAMINOPHEN 10 MG/ML IV SOLN
INTRAVENOUS | Status: AC
  Filled 2019-02-20: qty 100

## 2019-02-20 MED ORDER — SUCCINYLCHOLINE CHLORIDE 200 MG/10ML IV SOSY
PREFILLED_SYRINGE | INTRAVENOUS | Status: DC | PRN
  Administered 2019-02-20: 80 mg via INTRAVENOUS

## 2019-02-20 MED ORDER — FENTANYL CITRATE (PF) 100 MCG/2ML IJ SOLN: 25.0000 ug | INTRAMUSCULAR | Status: DC | PRN

## 2019-02-20 MED ORDER — MIDAZOLAM HCL 2 MG/2ML IJ SOLN
INTRAMUSCULAR | Status: AC
  Filled 2019-02-20: qty 2

## 2019-02-20 MED ORDER — ONDANSETRON HCL 4 MG/2ML IJ SOLN
INTRAMUSCULAR | Status: DC | PRN
  Administered 2019-02-20: 4 mg via INTRAVENOUS

## 2019-02-20 MED ORDER — LACTATED RINGERS IV SOLN
INTRAVENOUS | Status: DC | PRN
  Administered 2019-02-20 (×4): via INTRAVENOUS

## 2019-02-20 MED ORDER — KETAMINE HCL 10 MG/ML IJ SOLN
INTRAMUSCULAR | Status: DC | PRN
  Administered 2019-02-20: 10 mg via INTRAVENOUS
  Administered 2019-02-20: 20 mg via INTRAVENOUS

## 2019-02-20 MED ORDER — ARTIFICIAL TEARS OPHTHALMIC OINT
TOPICAL_OINTMENT | OPHTHALMIC | Status: AC
  Filled 2019-02-20: qty 3.5

## 2019-02-20 MED ORDER — ROCURONIUM BROMIDE 10 MG/ML (PF) SYRINGE
PREFILLED_SYRINGE | INTRAVENOUS | Status: DC | PRN
  Administered 2019-02-20: 10 mg via INTRAVENOUS
  Administered 2019-02-20: 50 mg via INTRAVENOUS

## 2019-02-20 MED ORDER — PHENYLEPHRINE HCL 10 MG/ML IJ SOLN
INTRAMUSCULAR | Status: DC | PRN
  Administered 2019-02-20: 80 ug via INTRAVENOUS

## 2019-02-20 MED ORDER — ONDANSETRON HCL 4 MG/2ML IJ SOLN
INTRAMUSCULAR | Status: AC
  Filled 2019-02-20: qty 2

## 2019-02-20 MED ORDER — LIDOCAINE 2% (20 MG/ML) 5 ML SYRINGE
INTRAMUSCULAR | Status: DC | PRN
  Administered 2019-02-20: 1 mg/kg/h via INTRAVENOUS

## 2019-02-20 MED ORDER — ONDANSETRON HCL 4 MG/2ML IJ SOLN
4.0000 mg | INTRAMUSCULAR | Status: AC
  Administered 2019-02-20: 4 mg via INTRAVENOUS
  Filled 2019-02-20: qty 2

## 2019-02-20 MED ORDER — PIPERACILLIN-TAZOBACTAM 3.375 G IVPB 30 MIN
3.3750 g | Freq: Once | INTRAVENOUS | Status: AC
  Administered 2019-02-20: 3.375 g via INTRAVENOUS
  Filled 2019-02-20: qty 50

## 2019-02-20 MED ORDER — FENTANYL CITRATE (PF) 100 MCG/2ML IJ SOLN
50.0000 ug | Freq: Once | INTRAMUSCULAR | Status: DC
  Filled 2019-02-20: qty 2

## 2019-02-20 MED ORDER — ROCURONIUM BROMIDE 10 MG/ML (PF) SYRINGE
PREFILLED_SYRINGE | INTRAVENOUS | Status: AC
  Filled 2019-02-20: qty 10

## 2019-02-20 MED ORDER — PIPERACILLIN-TAZOBACTAM IN DEX 2-0.25 GM/50ML IV SOLN: 2.2500 g | Freq: Four times a day (QID) | INTRAVENOUS | Status: DC

## 2019-02-20 MED ORDER — SODIUM CHLORIDE 0.9 % IV BOLUS
1000.0000 mL | Freq: Once | INTRAVENOUS | Status: AC
  Administered 2019-02-20: 1000 mL via INTRAVENOUS

## 2019-02-20 MED ORDER — MIDAZOLAM HCL 5 MG/5ML IJ SOLN
INTRAMUSCULAR | Status: DC | PRN
  Administered 2019-02-20: 1 mg via INTRAVENOUS

## 2019-02-20 MED ORDER — LIDOCAINE 2% (20 MG/ML) 5 ML SYRINGE
INTRAMUSCULAR | Status: AC
  Filled 2019-02-20: qty 5

## 2019-02-20 MED ORDER — FENTANYL CITRATE (PF) 100 MCG/2ML IJ SOLN
INTRAMUSCULAR | Status: DC | PRN
  Administered 2019-02-20 (×6): 50 ug via INTRAVENOUS
  Administered 2019-02-20 – 2019-02-21 (×2): 100 ug via INTRAVENOUS

## 2019-02-20 MED ORDER — PROPOFOL 10 MG/ML IV BOLUS
INTRAVENOUS | Status: AC
  Filled 2019-02-20: qty 20

## 2019-02-20 MED ORDER — IOPAMIDOL (ISOVUE-300) INJECTION 61%
100.0000 mL | Freq: Once | INTRAVENOUS | Status: AC | PRN
  Administered 2019-02-20: 80 mL via INTRAVENOUS

## 2019-02-20 MED ORDER — LIDOCAINE HCL 2 % IJ SOLN
INTRAMUSCULAR | Status: AC
  Filled 2019-02-20: qty 20

## 2019-02-20 MED ORDER — PHENYLEPHRINE HCL 10 MG/ML IJ SOLN
INTRAMUSCULAR | Status: AC
  Filled 2019-02-20: qty 1

## 2019-02-20 MED ORDER — KETAMINE HCL 10 MG/ML IJ SOLN
INTRAMUSCULAR | Status: AC
  Filled 2019-02-20: qty 1

## 2019-02-20 MED ORDER — ACETAMINOPHEN 10 MG/ML IV SOLN
INTRAVENOUS | Status: DC | PRN
  Administered 2019-02-20: 1000 mg via INTRAVENOUS

## 2019-02-20 MED ORDER — PROPOFOL 10 MG/ML IV BOLUS
INTRAVENOUS | Status: DC | PRN
  Administered 2019-02-20: 80 mg via INTRAVENOUS
  Administered 2019-02-20: 50 mg via INTRAVENOUS

## 2019-02-20 MED ORDER — ONDANSETRON HCL 4 MG/2ML IJ SOLN: 4.0000 mg | Freq: Once | INTRAMUSCULAR | Status: DC | PRN

## 2019-02-20 MED ORDER — 0.9 % SODIUM CHLORIDE (POUR BTL) OPTIME
TOPICAL | Status: DC | PRN
  Administered 2019-02-20: 3000 mL

## 2019-02-20 MED ORDER — SUGAMMADEX SODIUM 200 MG/2ML IV SOLN
INTRAVENOUS | Status: DC | PRN
  Administered 2019-02-20: 100 mg via INTRAVENOUS

## 2019-02-20 MED ORDER — SUCCINYLCHOLINE CHLORIDE 200 MG/10ML IV SOSY
PREFILLED_SYRINGE | INTRAVENOUS | Status: AC
  Filled 2019-02-20: qty 10

## 2019-02-20 MED ORDER — LIDOCAINE 2% (20 MG/ML) 5 ML SYRINGE
INTRAMUSCULAR | Status: DC | PRN
  Administered 2019-02-20: 60 mg via INTRAVENOUS

## 2019-02-20 MED ORDER — BUPIVACAINE-EPINEPHRINE (PF) 0.25% -1:200000 IJ SOLN
INTRAMUSCULAR | Status: DC | PRN
  Administered 2019-02-20: 30 mL

## 2019-02-20 SURGICAL SUPPLY — 50 items
BLADE EXTENDED COATED 6.5IN (ELECTRODE) ×3 IMPLANT
BNDG GAUZE ELAST 4 BULKY (GAUZE/BANDAGES/DRESSINGS) ×3 IMPLANT
CHLORAPREP W/TINT 26ML (MISCELLANEOUS) ×3 IMPLANT
COVER MAYO STAND STRL (DRAPES) ×3 IMPLANT
COVER WAND RF STERILE (DRAPES) IMPLANT
DRAIN CHANNEL 19F RND (DRAIN) ×3 IMPLANT
DRAPE LAPAROSCOPIC ABDOMINAL (DRAPES) ×3 IMPLANT
DRAPE SHEET LG 3/4 BI-LAMINATE (DRAPES) IMPLANT
DRAPE UTILITY XL STRL (DRAPES) ×3 IMPLANT
DRAPE WARM FLUID 44X44 (DRAPE) ×3 IMPLANT
DRSG PAD ABDOMINAL 8X10 ST (GAUZE/BANDAGES/DRESSINGS) ×3 IMPLANT
ELECT REM PT RETURN 15FT ADLT (MISCELLANEOUS) ×3 IMPLANT
EVACUATOR SILICONE 100CC (DRAIN) ×3 IMPLANT
GAUZE SPONGE 4X4 12PLY STRL (GAUZE/BANDAGES/DRESSINGS) ×3 IMPLANT
GLOVE ECLIPSE 8.0 STRL XLNG CF (GLOVE) ×27 IMPLANT
GLOVE INDICATOR 8.0 STRL GRN (GLOVE) ×3 IMPLANT
GOWN STRL REUS W/TWL XL LVL3 (GOWN DISPOSABLE) ×9 IMPLANT
HANDLE SUCTION POOLE (INSTRUMENTS) ×1 IMPLANT
KIT BASIN OR (CUSTOM PROCEDURE TRAY) ×3 IMPLANT
KIT TURNOVER KIT A (KITS) ×3 IMPLANT
LEGGING LITHOTOMY PAIR STRL (DRAPES) ×3 IMPLANT
LIGASURE IMPACT 36 18CM CVD LR (INSTRUMENTS) ×3 IMPLANT
PACK GENERAL/GYN (CUSTOM PROCEDURE TRAY) ×3 IMPLANT
PAD POSITIONING PINK XL (MISCELLANEOUS) ×3 IMPLANT
SPONGE LAP 18X18 RF (DISPOSABLE) IMPLANT
STAPLER CUT CVD 40MM GREEN (STAPLE) ×3 IMPLANT
STAPLER PROXIMATE 75MM BLUE (STAPLE) ×3 IMPLANT
STAPLER VISISTAT 35W (STAPLE) IMPLANT
SUCTION POOLE HANDLE (INSTRUMENTS) ×3
SUT ETHILON 2 0 PS N (SUTURE) ×3 IMPLANT
SUT ETHILON 3 0 PS 1 (SUTURE) IMPLANT
SUT NOVA 1 T20/GS 25DT (SUTURE) IMPLANT
SUT PDS AB 1 CTX 36 (SUTURE) IMPLANT
SUT PDS AB 1 TP1 96 (SUTURE) ×6 IMPLANT
SUT SILK 2 0 (SUTURE)
SUT SILK 2 0 SH CR/8 (SUTURE) IMPLANT
SUT SILK 2 0SH CR/8 30 (SUTURE) ×3 IMPLANT
SUT SILK 2-0 18XBRD TIE 12 (SUTURE) IMPLANT
SUT SILK 3 0 (SUTURE)
SUT SILK 3 0 SH CR/8 (SUTURE) IMPLANT
SUT SILK 3-0 18XBRD TIE 12 (SUTURE) IMPLANT
SUT VIC AB 0 CT1 27 (SUTURE) ×2
SUT VIC AB 0 CT1 27XBRD ANTBC (SUTURE) ×1 IMPLANT
SUT VIC AB 2-0 SH 18 (SUTURE) IMPLANT
SUT VIC AB 3-0 SH 18 (SUTURE) IMPLANT
SUT VIC AB 3-0 SH 8-18 (SUTURE) ×6 IMPLANT
TAPE CLOTH SURG 6X10 WHT LF (GAUZE/BANDAGES/DRESSINGS) ×3 IMPLANT
TOWEL OR 17X26 10 PK STRL BLUE (TOWEL DISPOSABLE) ×3 IMPLANT
TOWEL OR NON WOVEN STRL DISP B (DISPOSABLE) IMPLANT
TRAY FOLEY MTR SLVR 14FR STAT (SET/KITS/TRAYS/PACK) ×3 IMPLANT

## 2019-02-20 NOTE — ED Notes (Signed)
Bed: MB86 Expected date:  Expected time:  Means of arrival:  Comments: EMS 73 yo rectal pain/mass

## 2019-02-20 NOTE — ED Notes (Signed)
Patient return from CT

## 2019-02-20 NOTE — ED Notes (Signed)
Placed pt on Purewick. Will collect urine sample when pt voids.

## 2019-02-20 NOTE — H&P (Signed)
CC: Called by Margarita Mail, PA-C for near obstructing rectal cancer with perforation  HPI: Marie Levy is an 73 y.o. female with hx of hypothyroidism whom presented to the ED today with complaints of abdominal pain - worsened over last 24hrs. She was recently referred to our office as a referral by Dr. Benson Norway for newly diagnosed rectal cancer - near obstructing. Over the past few months, she has been following with her primary care physician who has been treating her for GI related issues including possible "colitis" and SIBO. Her symptoms persisted and she was ultimately referred for evaluation of GI. Dr. Benson Norway did a flexible sigmoidoscopy on her on 02/11/2019. He found a fungating near obstructing mass in the rectum that he was unable to traverse with the sigmoidoscope. This was circumferential. This was biopsied. On his measurement he believes masses 10 cm from anal verge. The adult endoscope was not able to traverse the lesion. Biopsies returned invasive adenocarcinoma.  CEA 2.5 CT Chest- no evidence of metastatic dz CT A/P thickening of the gastric antrum; rectal mass. Liver cysts but no evidence of metastatic disease in the abdomen or pelvis.  MRI pelvis pending  She reports having had a colonoscopy approximately 20 years ago and was told this was normal.  Over the last 24hrs her pain has changed and worsened. She felt weak. She therefore presented to the ED. Her pain is sharp and diffuse. She denies n/v. She has continued to pass gas and have liquid stool.   PMH: Hypothyoidism (takes thyroid supplements similar to Armour thyroid); denies any other known medical problems  PSH: Tonsillectomy; denies any other operations or abdominal surgeries.  FHx: She reports her mother had colon cancer and died at age of 11 for metastatic disease to her liver  Social: Denies use of tobacco/EtOH/drugs. 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  ROS: A comprehensive 10 system review of systems was completed with the patient and pertinent findings as noted above.   History reviewed. No pertinent past medical history.  Past Surgical History:  Procedure Laterality Date   TONSILLECTOMY      No family history on file.  Social:  reports that she has never smoked. She has never used smokeless tobacco. She reports previous alcohol use. She reports that she does not use drugs.  Allergies: No Known Allergies  Medications: I have reviewed the patient's current medications.  Results for orders placed or performed during the hospital encounter of 02/20/19 (from the past 48 hour(s))  CBC with Differential     Status: Abnormal   Collection Time: 02/20/19  6:20 PM  Result Value Ref Range   WBC 12.4 (H) 4.0 - 10.5 K/uL   RBC 4.44 3.87 - 5.11 MIL/uL   Hemoglobin 10.7 (L) 12.0 - 15.0 g/dL   HCT 35.5 (L) 36.0 - 46.0 %   MCV 80.0 80.0 - 100.0 fL   MCH 24.1 (L) 26.0 - 34.0 pg   MCHC 30.1 30.0 - 36.0 g/dL   RDW 14.7 11.5 - 15.5 %   Platelets 422 (H) 150 - 400 K/uL   nRBC 0.0 0.0 - 0.2 %   Neutrophils Relative % 74 %   Neutro Abs 9.1 (H) 1.7 - 7.7 K/uL   Lymphocytes Relative 3 %   Lymphs Abs 0.4 (L) 0.7 - 4.0 K/uL   Monocytes Relative 4 %   Monocytes Absolute 0.5 0.1 - 1.0 K/uL   Eosinophils Relative 17 %  Eosinophils Absolute 2.1 (H) 0.0 - 0.5 K/uL   Basophils Relative 1 %   Basophils Absolute 0.1 0.0 - 0.1 K/uL   WBC Morphology MILD LEFT SHIFT (1-5% METAS, OCC MYELO, OCC BANDS)    Immature Granulocytes 1 %   Abs Immature Granulocytes 0.16 (H) 0.00 - 0.07 K/uL    Comment: Performed at [facility], [place] [address], [place], [place] [address]  Comprehensive metabolic panel     Status: Abnormal   Collection Time: 02/20/19  6:20 PM  Result Value Ref Range   Sodium 134 (L) 135 - 145 mmol/L   Potassium 3.5 3.5 - 5.1 mmol/L   Chloride 101 98 - 111 mmol/L   CO2 24 22 - 32  mmol/L   Glucose, Bld 101 (H) 70 - 99 mg/dL   BUN 13 8 - 23 mg/dL   Creatinine, Ser 0.64 0.44 - 1.00 mg/dL   Calcium 7.9 (L) 8.9 - 10.3 mg/dL   Total Protein 6.1 (L) 6.5 - 8.1 g/dL   Albumin 2.6 (L) 3.5 - 5.0 g/dL   AST 21 15 - 41 U/L   ALT 13 0 - 44 U/L   Alkaline Phosphatase 84 38 - 126 U/L   Total Bilirubin 1.4 (H) 0.3 - 1.2 mg/dL   GFR calc non Af Amer >60 >60 mL/min   GFR calc Af Amer >60 >60 mL/min   Anion gap 9 5 - 15    Comment: Performed at [facility], [place] [address], [place], [place] [address]  Lipase, blood     Status: None   Collection Time: 02/20/19  6:20 PM  Result Value Ref Range   Lipase 24 11 - 51 U/L    Comment: Performed at [facility], [place] [address], [place], [place] [address]  Lactic acid, plasma     Status: None   Collection Time: 02/20/19  6:48 PM  Result Value Ref Range   Lactic Acid, Venous 1.5 0.5 - 1.9 mmol/L    Comment: Performed at [facility], [place] [address] [place], [place] [address]    Ct Abdomen Pelvis W Contrast  Result Date: 02/20/2019 CLINICAL DATA:  73 year old female with increasing abdominal pain. By report, mass was found on recent sigmoidoscopy. EXAM: CT ABDOMEN AND PELVIS WITH CONTRAST TECHNIQUE: Multidetector CT imaging of the abdomen and pelvis was performed using the standard protocol following bolus administration of intravenous contrast. CONTRAST:  84m ISOVUE-300 IOPAMIDOL (ISOVUE-300) INJECTION 61% COMPARISON:  CT Abdomen and Pelvis 02/07/2019. FINDINGS: Lower chest: Pneumoperitoneum is now visible under the diaphragm on series 4, image 42. dependent atelectasis. No pericardial or pleural effusion. Hepatobiliary: Small volume of free air around all margins of the liver. Round low-density areas in the liver dome appear stable and are probably benign cysts. Negative gallbladder aside from small volume of adjacent free fluid. No bile duct enlargement. Pancreas: Negative. Spleen:  Negative aside from trace free fluid. Adrenals/Urinary Tract: Normal adrenal glands. Bilateral renal enhancement is symmetric. Early bilateral renal pyramid contrast excretion. Decompressed urinary bladder. Stomach/Bowel: The severe distal large bowel stenosis in the area of masslike soft tissue thickening is redemonstrated on series 2, image 71. Just anterior to that area located in the deep pelvis to the right of midline there is a 24 x 48 by 30 millimeter extraluminal appearing air and fluid collection. See series 2 image 68 and coronal image 62. Generalized inflammatory stranding in the small and large bowel mesentery throughout the abdomen. Intermittent small volume free  fluid. Fluid-filled but mostly nondilated small bowel loops. Retained stool in the large bowel upstream of the tumor related stricture. The right colon is decompressed. Decompressed stomach. Vascular/Lymphatic: Aortoiliac calcified atherosclerosis. Major arterial structures are patent. Portal venous system is patent. Reproductive: Negative. Other: Small volume of free fluid in the anterior pelvis on series 2, image 71 with simple fluid density. Small volume of pneumoperitoneum under the lower abdominal wall at the level of the pelvic inlet on image 62. Musculoskeletal: No acute osseous abnormality identified. IMPRESSION: 1. Pneumoperitoneum indicating a perforated loop of bowel. There appears to be a 4 cm extraluminal collection of gas and fluid central in the pelvis anterior to the sigmoid colon (series 2, image 68). This is in the region of distal large bowel tumor as described on 02/07/2019. Critical Value/emergent results were called by telephone at the time of interpretation on 02/20/2019 at 1933 hours to PA-C [name] , who verbally acknowledged these results. 2. Small volume of free fluid and free air scattered in the bilateral abdomen. Generalized mesenteric inflammation. 3. No metastatic disease identified. Electronically Signed    By: Genevie Ann M.D.   On: 02/20/2019 19:43    ROS - all of the below systems have been reviewed with the patient and positives are indicated with bold text General: chills, fever or night sweats Eyes: blurry vision or double vision ENT: epistaxis or sore throat Allergy/Immunology: itchy/watery eyes or nasal congestion Hematologic/Lymphatic: bleeding problems, blood clots or swollen lymph nodes Endocrine: temperature intolerance or unexpected weight changes Breast: new or changing breast lumps or nipple discharge Resp: cough, shortness of breath, or wheezing CV: chest pain or dyspnea on exertion GI: as per HPI GU: dysuria, trouble voiding, or hematuria MSK: joint pain or joint stiffness Neuro: TIA or stroke symptoms Derm: pruritus and skin lesion changes Psych: anxiety and depression  PE Blood pressure 105/72, pulse (!) 110, temperature 98.5 F (36.9 C), temperature source Oral, resp. rate (!) 25, height 5' 6" (1.676 m), weight 56.2 kg, SpO2 97 %. Constitutional: Appears uncomfortable; conversant; no deformities Eyes: Moist conjunctiva; no lid lag; anicteric; PERRL Neck: Trachea midline; no thyromegaly Lungs: Normal respiratory effort; no tactile fremitus CV: Tachycardic rate, regular rhythm; no palpable thrills; no pitting edema GI: Abd diffusely ttp; +rebound; no palpable hepatosplenomegaly MSK: Normal extremities; no clubbing/cyanosis Psychiatric: Appropriate affect; alert and oriented x3 Lymphatic: No palpable cervical or axillary lymphadenopathy  Results for orders placed or performed during the hospital encounter of 02/20/19 (from the past 48 hour(s))  CBC with Differential     Status: Abnormal   Collection Time: 02/20/19  6:20 PM  Result Value Ref Range   WBC 12.4 (H) 4.0 - 10.5 K/uL   RBC 4.44 3.87 - 5.11 MIL/uL   Hemoglobin 10.7 (L) 12.0 - 15.0 g/dL   HCT 35.5 (L) 36.0 - 46.0 %   MCV 80.0 80.0 - 100.0 fL   MCH 24.1 (L) 26.0 - 34.0 pg   MCHC 30.1 30.0 - 36.0 g/dL   RDW  14.7 11.5 - 15.5 %   Platelets 422 (H) 150 - 400 K/uL   nRBC 0.0 0.0 - 0.2 %   Neutrophils Relative % 74 %   Neutro Abs 9.1 (H) 1.7 - 7.7 K/uL   Lymphocytes Relative 3 %   Lymphs Abs 0.4 (L) 0.7 - 4.0 K/uL   Monocytes Relative 4 %   Monocytes Absolute 0.5 0.1 - 1.0 K/uL   Eosinophils Relative 17 %   Eosinophils Absolute 2.1 (H) 0.0 -  0.5 K/uL   Basophils Relative 1 %   Basophils Absolute 0.1 0.0 - 0.1 K/uL   WBC Morphology MILD LEFT SHIFT (1-5% METAS, OCC MYELO, OCC BANDS)    Immature Granulocytes 1 %   Abs Immature Granulocytes 0.16 (H) 0.00 - 0.07 K/uL    Comment: Performed at [facility], [place] [address] [place], [place] [address]  Comprehensive metabolic panel     Status: Abnormal   Collection Time: 02/20/19  6:20 PM  Result Value Ref Range   Sodium 134 (L) 135 - 145 mmol/L   Potassium 3.5 3.5 - 5.1 mmol/L   Chloride 101 98 - 111 mmol/L   CO2 24 22 - 32 mmol/L   Glucose, Bld 101 (H) 70 - 99 mg/dL   BUN 13 8 - 23 mg/dL   Creatinine, Ser 0.64 0.44 - 1.00 mg/dL   Calcium 7.9 (L) 8.9 - 10.3 mg/dL   Total Protein 6.1 (L) 6.5 - 8.1 g/dL   Albumin 2.6 (L) 3.5 - 5.0 g/dL   AST 21 15 - 41 U/L   ALT 13 0 - 44 U/L   Alkaline Phosphatase 84 38 - 126 U/L   Total Bilirubin 1.4 (H) 0.3 - 1.2 mg/dL   GFR calc non Af Amer >60 >60 mL/min   GFR calc Af Amer >60 >60 mL/min   Anion gap 9 5 - 15    Comment: Performed at [facility], [place] [address], [place], [place] [address]  Lipase, blood     Status: None   Collection Time: 02/20/19  6:20 PM  Result Value Ref Range   Lipase 24 11 - 51 U/L    Comment: Performed at [facility], [place] [address], [place], [place] [address]  Lactic acid, plasma     Status: None   Collection Time: 02/20/19  6:48 PM  Result Value Ref Range   Lactic Acid, Venous 1.5 0.5 - 1.9 mmol/L    Comment: Performed at [facility], [place] [address] [place], [place] [address]    Ct Abdomen  Pelvis W Contrast  Result Date: 02/20/2019 CLINICAL DATA:  73 year old female with increasing abdominal pain. By report, mass was found on recent sigmoidoscopy. EXAM: CT ABDOMEN AND PELVIS WITH CONTRAST TECHNIQUE: Multidetector CT imaging of the abdomen and pelvis was performed using the standard protocol following bolus administration of intravenous contrast. CONTRAST:  25m ISOVUE-300 IOPAMIDOL (ISOVUE-300) INJECTION 61% COMPARISON:  CT Abdomen and Pelvis 02/07/2019. FINDINGS: Lower chest: Pneumoperitoneum is now visible under the diaphragm on series 4, image 42. dependent atelectasis. No pericardial or pleural effusion. Hepatobiliary: Small volume of free air around all margins of the liver. Round low-density areas in the liver dome appear stable and are probably benign cysts. Negative gallbladder aside from small volume of adjacent free fluid. No bile duct enlargement. Pancreas: Negative. Spleen: Negative aside from trace free fluid. Adrenals/Urinary Tract: Normal adrenal glands. Bilateral renal enhancement is symmetric. Early bilateral renal pyramid contrast excretion. Decompressed urinary bladder. Stomach/Bowel: The severe distal large bowel stenosis in the area of masslike soft tissue thickening is redemonstrated on series 2, image 71. Just anterior to that area located in the deep pelvis to the right of midline there is a 24 x 48 by 30 millimeter extraluminal appearing air and fluid collection. See series 2 image 68 and coronal image 62. Generalized inflammatory stranding in the small and large bowel mesentery throughout the abdomen. Intermittent small volume free fluid. Fluid-filled but mostly nondilated small bowel  loops. Retained stool in the large bowel upstream of the tumor related stricture. The right colon is decompressed. Decompressed stomach. Vascular/Lymphatic: Aortoiliac calcified atherosclerosis. Major arterial structures are patent. Portal venous system is patent. Reproductive: Negative.  Other: Small volume of free fluid in the anterior pelvis on series 2, image 71 with simple fluid density. Small volume of pneumoperitoneum under the lower abdominal wall at the level of the pelvic inlet on image 62. Musculoskeletal: No acute osseous abnormality identified. IMPRESSION: 1. Pneumoperitoneum indicating a perforated loop of bowel. There appears to be a 4 cm extraluminal collection of gas and fluid central in the pelvis anterior to the sigmoid colon (series 2, image 68). This is in the region of distal large bowel tumor as described on 02/07/2019. Critical Value/emergent results were called by telephone at the time of interpretation on 02/20/2019 at 1933 hours to PA-C [name] , who verbally acknowledged these results. 2. Small volume of free fluid and free air scattered in the bilateral abdomen. Generalized mesenteric inflammation. 3. No metastatic disease identified. Electronically Signed   By: Genevie Ann M.D.   On: 02/20/2019 19:43   A/P: SWAYZE KOZUCH is an 73 y.o. female with perforated viscus in setting of near obstructing rectal cancer - most likely has perforation at the tumor based on CT  -I met with the patient and her son [name] at bedside. We discussed all of the above. We discussed at this point, with perforation which is partially uncontained given her exam and CT findings, proceeding to OR for exploration to address the perforation. We discussed that without surgery her condition will worsen and this would almost certainly be life ending. -The anatomy and physiology of the GI tract was discussed at length with the patient. The pathophysiology of perforated viscus and cancer was discussed at length as well -We discussed exploratory laparotomy, possible Hartmann's procedure with end colostomy; possible bowel resection; possible diverting colostomy alone if not resectable primary. We also discussed possible ileostomy. We discussed the ostomy may be permanent.  -The planned  procedure, material risks (including, but not limited to, pain, bleeding, infection, scarring, need for blood transfusion, damage to surrounding structures- blood vessels/nerves/viscus/organs, damage to ureter, urine leak, leak from anastomosis, need for additional procedures, need for stoma which may be permanent, worsening of pre-existing medical conditions, DVT/PE, hernia, recurrence/persistence of her cancer, pneumonia, heart attack, stroke, death) benefits and alternatives to surgery were discussed at length. I noted a good probability that the procedure would help improve their symptoms  - 50-90%. The patient's questions and her son's were answered to their satisfaction, they voiced understanding and elected to proceed with surgery. Additionally, we discussed typical postoperative expectations and the recovery process.  Sharon Mt. [name], M.D. [facility]

## 2019-02-20 NOTE — ED Triage Notes (Signed)
Pt bib EMS and coming from home.  Pt recently had  Sigmoidoscopy and a mass was found.  Pt was told to come to the ED if she had pain.  Pt a/o x 4. Pt also reports abd pain.  EMS reports her lower abd felt hard and tenderness upon palpation.   EMS VS: BP: 92/50 (after 237ml of NaCl), HR: 105, RR: 16

## 2019-02-20 NOTE — Anesthesia Procedure Notes (Signed)
Arterial Line Insertion Start/End3/19/2020 9:18 PM, 02/20/2019 9:21 PM Performed by: Catalina Gravel, MD, anesthesiologist  Patient location: Pre-op. Preanesthetic checklist: patient identified, IV checked, site marked, risks and benefits discussed, surgical consent, monitors and equipment checked, pre-op evaluation, timeout performed and anesthesia consent Lidocaine 1% used for infiltration Left, radial was placed Catheter size: 20 Fr Hand hygiene performed  and maximum sterile barriers used   Attempts: 1 Procedure performed without using ultrasound guided technique. Following insertion, dressing applied and Biopatch. Post procedure assessment: normal and unchanged  Patient tolerated the procedure well with no immediate complications.

## 2019-02-20 NOTE — ED Notes (Signed)
Patient transported to CT 

## 2019-02-20 NOTE — ED Provider Notes (Signed)
[facility] DEPT Provider Note   CSN: [id number] Arrival date & time: 02/20/19  1700    History   Chief Complaint No chief complaint on file.   HPI BETHENE HANKINSON is a 73 y.o. female who presents emergency department chief complaint of abdominal pain.  The patient states that 1 week ago she was diagnosed with a rectal mass.  She states she had a sigmoidoscopy with Dr. Almyra Free and states that he was unable to advance the scope past the mass.  She states that since that time she has had progressively worsening general abdominal pain which she describes as constant, aching and sometimes has associated sharp cramps throughout her abdomen.  She states that Dr. Benson Norway tried to admit her last week but she declined admission and states "I really wish I had taken him up on that offer."  Patient states that she has had severe abdominal pain over the past 48 hours.  Over the past week she has had very poor oral intake and states that she has mostly been drinking water and bone broth.  She does not have an appetite.  She has had intermittent nausea.  She feels extremely weak and dehydrated.  She denies fevers or chills.  She has been able to have bowel movements.  She states that her double is mostly liquid and she is also taking MiraLAX once daily.  She was able to make a small bowel movement today.  He denies melena or hematochezia.  She denies urinary symptoms.     HPI  History reviewed. No pertinent past medical history.  There are no active problems to display for this patient.   Past Surgical History:  Procedure Laterality Date  . TONSILLECTOMY       OB History   No obstetric history on file.      Home Medications    Prior to Admission medications   Not on File    Family History No family history on file.  Social History Social History   Tobacco Use  . Smoking status: Never Smoker  . Smokeless tobacco: Never Used  Substance Use Topics  . Alcohol  use: Not Currently  . Drug use: Never     Allergies   Patient has no known allergies.   Review of Systems Review of Systems  Ten systems reviewed and are negative for acute change, except as noted in the HPI.   Physical Exam Updated Vital Signs BP 91/62 (BP Location: Right Arm)   Pulse (!) 108   Temp 98.5 F (36.9 C) (Oral)   Resp (!) 21   Ht 5\' 6"  (1.676 m)   Wt 56.2 kg   SpO2 95%   BMI 20.01 kg/m   Physical Exam Vitals signs and nursing note reviewed.  Constitutional:      General: She is not in acute distress.    Appearance: She is well-developed and underweight. She is ill-appearing. She is not diaphoretic.  HENT:     Head: Normocephalic and atraumatic.     Mouth/Throat:     Mouth: Mucous membranes are dry.  Eyes:     General: No scleral icterus.    Conjunctiva/sclera: Conjunctivae normal.  Neck:     Musculoskeletal: Normal range of motion.  Cardiovascular:     Rate and Rhythm: Normal rate and regular rhythm.     Heart sounds: Normal heart sounds. No murmur. No friction rub. No gallop.   Pulmonary:     Effort: Pulmonary effort is normal. No  respiratory distress.     Breath sounds: Normal breath sounds.  Abdominal:     General: Bowel sounds are normal. There is no distension.     Palpations: Abdomen is soft. There is no mass.     Tenderness: There is generalized abdominal tenderness. There is no guarding.  Skin:    General: Skin is warm and dry.  Neurological:     Mental Status: She is alert and oriented to person, place, and time.  Psychiatric:        Behavior: Behavior normal.      ED Treatments / Results  Labs (all labs ordered are listed, but only abnormal results are displayed) Labs Reviewed - No data to display  EKG None  Radiology No results found.  Procedures .Critical Care Performed by: Margarita Mail, PA-C Authorized by: Margarita Mail, PA-C   Critical care provider statement:    Critical care time (minutes):  45   Critical  care was necessary to treat or prevent imminent or life-threatening deterioration of the following conditions: Acute bowel perforation, surgical abdomen.   Critical care was time spent personally by me on the following activities:  Discussions with consultants, evaluation of patient's response to treatment, examination of patient, ordering and performing treatments and interventions, ordering and review of laboratory studies, ordering and review of radiographic studies, pulse oximetry, re-evaluation of patient's condition, obtaining history from patient or surrogate and review of old charts   (including critical care time)  Medications Ordered in ED Medications - No data to display   Initial Impression / Assessment and Plan / ED Course  I have reviewed the triage vital signs and the nursing notes.  Pertinent labs & imaging results that were available during my care of the patient were reviewed by me and considered in my medical decision making (see chart for details).        CC: Abdominal pain VS: BP 102/65   Pulse (!) 104   Temp 98.5 F (36.9 C) (Oral)   Resp (!) 23   Ht 5\' 6"  (1.676 m)   Wt 56.2 kg   SpO2 100%   BMI 20.01 kg/m  YS:AYTKZSW is gathered by the patient and review of EMR and her son who is at bedside. DDX: The causes for generalized abdominal pain include but are not limited to gastritis, gastroenteritis, IBS, pancreatitis, peritonitis, intestinal ischemia, constipation, UTI, intestinal obstruction, perforated viscus, eg, peptic ulcer, appendix, gallbladder, diverticulitis, physical or sexual abuse, abdominal abscess,  ruptured spleen, AAA, diabetic ketoacidosis, hypercalcemia, uremia, parasitic or other infection, eg: tapeworms, flukes, Giargia, Cryto, Yersinia, adrenal insufficiency,lead poisoning, iron toxicity, polyarteritis nodosa, Henoch-Schnlein purpura, porphyria, eg, acute intermittent porphyria, familial Mediterranean fever. Labs: I reviewed the labs which show  leukocytosis with left shift., normocytic anemia, elevated platelet count, mild eosinophilia and lymphopenia.  Patient does not have an elevated lactic acid I do not think she is septic.  Lipase is normal.  She has mild hyponatremia.  Calcium and albumin levels are also mildly low.  Urine has not resulted. Imaging: I personally reviewed the images (CT abdomen and pelvis with contrast.) which show(s) Pneumoperitoneum and perforated loop of bowel.  I was contacted emergently by Dr. Nevada Crane about these results and we discussed findings of the CT scan. EKG: n/a MDM: Patient with acute bowel and per perforation and pneumoperitoneum.  I discussed the findings with Dr. [name] from surgery who will admit the patient for emergent surgery.  Patient given IV Zosyn.  Patient disposition: Admission Patient condition: Pt stable  in ED with no significant deterioration in condition.      Final Clinical Impressions(s) / ED Diagnoses   Final diagnoses:  Bowel perforation [facility])    ED Discharge Orders    None       Margarita Mail, PA-C 02/20/19 2349    Quintella Reichert, MD 02/22/19 1520

## 2019-02-20 NOTE — Op Note (Addendum)
02/20/2019  11:49 PM  PATIENT:  Marie Levy  73 y.o. female  Patient Care Team: [name], MD as PCP - General (Family Medicine)  PRE-OPERATIVE DIAGNOSIS: Perforated rectal cancer  POST-OPERATIVE DIAGNOSIS:  Same  PROCEDURE:   1. Exploratory laparotomy 2. Low anterior resection 3. End colostomy  SURGEON:  Nadeen Landau, MD  ASSISTANT: Coralie Keens, MD  ANESTHESIA:   general  COUNTS:  Sponge, needle and instrument counts were reported correct x2 at the conclusion of the operation.  EBL: 150 mL  DRAINS: 19 Fr round blake drain left draining pelvis  SPECIMEN: Rectosigmoid colon; open end distal  COMPLICATIONS: None apparent  FINDINGS: Purulent peritoneal fluid upon entry.  Mid rectal cancer with perforation on the right anterior lateral wall involving over half the circumference.  There was a large associated abscess cavity containing pus.  The distal ileum was adherent to this abscess cavity.  The mesentery along the distal ileum had significant fibrinous exudate coating it and a small rent within it.  The ileum at this level appeared clearly viable without any evidence of perforation.  The cecum was not significantly dilated and appeared normal.  The appendix appeared normal.  The ascending, transverse, descending colon appeared normal.  The liver surface felt smooth.  The uterus and both adnexa appeared normal.  Given these findings, the decision was made to proceed with low anterior resection as the perforation was quite large.  All tissues around this perforation were thickened with woody-like edema.  It was quite difficult to distinguish tumor from inflamed tissue.  With just manipulation of the rectum, the remaining tumor fractured - this was therefore completed sharply. The stump was then oversewn and drained. An end colostomy was fashioned using well perfused proximal sigmoid colon.  DISPOSITION: PACU in satisfactory condition  INDICATION: Marie Levy is an 73 y.o. female with hx of hypothyroidism who was undergoing work-up for a near obstructing locally advanced mid rectal cancer.  She just had a colonoscopy done 02/11/2019 with Dr. Benson Norway which demonstrated this mass.  He was unable to traverse it.  She was seen in follow-up in underwent staging evaluation.  She was still passing gas and having bowel movements. She presented to the emergency room earlier today with a rather acute onset of worsening abdominal pain and on work-up was found to have a likely perforated rectal cancer with an associated abscess cavity, pneumoperitoneum, and peritonitis.  Options were discussed and she opted to pursue surgery.  Please refer to notes dictated elsewhere for details regarding this discussion  DESCRIPTION: The patient was identified in preop holding and taken to the OR where she was placed on the operating room table. SCDs were placed. General endotracheal anesthesia was induced without difficulty. A foley catheter was placed under sterile conditions. She was then positioned in lithotomy using Allen stirrups.  Pressure points were then padded. She was then prepped and draped in the usual sterile fashion. A surgical timeout was performed indicating the correct patient, procedure, positioning and need for preoperative antibiotics.   A low midline incision was created with a scalpel.  Subcutaneous tissue was then divided using electrocautery.  The midline fascia was identified and incised.  The peritoneum was grasped between clamps and entered sharply.  Upon entry, purulent material was exuding from the incision.  This was evacuated.  A Balfour retractor was placed.  Small bowel was swept out of the pelvis and a loop of distal ileum was identified and adherent to the abscess  cavity which was present in her pelvis.  There was a rent in the mesentery associated with the segment of bowel but the distal ileum itself appeared viable and without injury.  The sigmoid colon  was mobilized off of the intersigmoid fossa.  Laterally the left ureter was identified and swept away from the field of dissection.  Working of the pelvis, along the right anterior lateral wall of the rectum at the peritoneal reflection there was a clear perforation.  At least half the circumference of the rectum was involved with this perforation.  Given these findings, the decision was made to proceed with a low anterior resection.  I went below and completed another digital rectal exam as well as a vaginal exam to confirm anatomy.  There was significant inflammatory rind around the level of the perforation making distinguishing between what is tumor and inflamed tissue quite challenging.  The space posterior to the rectum appeared to be the least involved area.  The sigmoid mesentery was again elevated anteriorly and the IMA pedicle was identified.  The left ureter was confirmed to be down and well away from our field of dissection.  This was then ligated using the LigaSure.  Hemostasis was verified.  The space tween the fascia propria the rectum and the presacral fascia was developed in the avascular TME plane.  At the level of the tumor this became less distinct related to the perforation.  Posteriorly, dissection was carried below the tumor.  With just manipulation of the specimen, fracturing was occurring.  At a level it appeared to be below the tumor, the remaining wall of the rectum was divided.  Anteriorly there was not any clear tumor remaining behind. There was significant burden of inflamed/thickened tissue however. The rectal stump was oversewn with interrupted 3-0 silk suture.  There is no visible lumen after oversewing this.  The pelvis was reinspected and hemostasis was noted to be achieved.  The location of the planned colostomy was identified on a pink and well-perfused segment of proximal sigmoid colon.  The colon at this level was divided using a GIA blue load stapler.  The specimen was passed  off.   The stomach was palpated and felt smooth.  The NG tube was confirmed to be in appropriate position.  The small bowel was run from the ligament of Treitz to the ileocecal valve, twice.  No other concerning areas were identified.  The segment of distal ileum again appeared viable without any evidence of injury to the ileum itself.  The colon was reinspected and normal in appearance.  No other masses were palpated in the lumen of the colon.  The abdomen and pelvis were copiously irrigated with warm saline until the effluent ran clear.  A stab incision was created in the right lower quadrant and a 19 French round Blake drain was placed draining the rectal stump.  Attention was turned to creating the colostomy.  The fascia was grasped with Kocher clamps and the location determined to be just inferolateral to the umbilicus given her habitus.  A skin disc was incised and subcutaneous tissue removed.  The fascia was incised and the rectus muscle spread.  The anterior sheath was incised.  The colostomy was then passed through the stoma site.  The pelvis was reinspected and hemostasis verified.  The colostomy was reinspected and orientation confirmed such that there is no twisting.  Sponge, needle, and instrument counts were reported correct x2.  Attention was turned to closing the abdomen.  The  midline fascia was approximated using 2 running #1 looped PDS sutures.  Local anesthetic was infiltrated in the midline wound.  Given the contamination in the case, the decision was made to leave the skin open.  The wound was protected.  Attention was turned to maturing the colostomy.  The staple line was removed with electrocautery.  The colostomy was then matured in a partial Brooke manner using 3-0 Vicryl sutures.  The colostomy was pink and well perfused.  There was no tension on it.  This was gently digitized and noted to be widely patent.  A colostomy appliance was cut and applied.  A moist Kerlix was placed over  the midline subcutaneous tissue, covered by 4 x 4 gauze and an ABD pad.

## 2019-02-20 NOTE — Anesthesia Preprocedure Evaluation (Addendum)
Anesthesia Evaluation  Patient identified by MRN, date of birth, ID band Patient awake    Reviewed: Allergy & Precautions, NPO status , Patient's Chart, lab work & pertinent test results  Airway Mallampati: II  TM Distance: <3 FB Neck ROM: Full    Dental  (+) Dental Advisory Given, Missing   Pulmonary neg pulmonary ROS,    Pulmonary exam normal breath sounds clear to auscultation       Cardiovascular negative cardio ROS Normal cardiovascular exam Rhythm:Regular Rate:Normal     Neuro/Psych negative neurological ROS     GI/Hepatic Neg liver ROS, free air, perforated rectal cancer   Endo/Other  Hypothyroidism   Renal/GU negative Renal ROS     Musculoskeletal negative musculoskeletal ROS (+)   Abdominal   Peds  Hematology  (+) Blood dyscrasia, anemia ,   Anesthesia Other Findings Day of surgery medications reviewed with the patient.  Reproductive/Obstetrics                            Anesthesia Physical Anesthesia Plan  ASA: V and emergent  Anesthesia Plan: General   Post-op Pain Management:    Induction: Intravenous, Cricoid pressure planned and Rapid sequence  PONV Risk Score and Plan: 4 or greater and Dexamethasone, Ondansetron and Treatment may vary due to age or medical condition  Airway Management Planned: Oral ETT  Additional Equipment: Arterial line, CVP and Ultrasound Guidance Line Placement  Intra-op Plan:   Post-operative Plan: Possible Post-op intubation/ventilation  Informed Consent: I have reviewed the patients History and Physical, chart, labs and discussed the procedure including the risks, benefits and alternatives for the proposed anesthesia with the patient or authorized representative who has indicated his/her understanding and acceptance.     Dental advisory given and Only emergency history available  Plan Discussed with: CRNA  Anesthesia Plan Comments:          Anesthesia Quick Evaluation

## 2019-02-20 NOTE — Telephone Encounter (Signed)
A new patient appt has been scheduled for the pt to see Dr. Burr Medico on 3/25 at  10am. Pt has been cld and made aware of the appt date and time.

## 2019-02-21 ENCOUNTER — Encounter (HOSPITAL_COMMUNITY): Payer: Self-pay | Admitting: Surgery

## 2019-02-21 LAB — POCT I-STAT 7, (LYTES, BLD GAS, ICA,H+H)
Acid-base deficit: 3 mmol/L — ABNORMAL HIGH (ref 0.0–2.0)
Acid-base deficit: 5 mmol/L — ABNORMAL HIGH (ref 0.0–2.0)
Bicarbonate: 19.6 mmol/L — ABNORMAL LOW (ref 20.0–28.0)
Bicarbonate: 22.1 mmol/L (ref 20.0–28.0)
Calcium, Ion: 1.08 mmol/L — ABNORMAL LOW (ref 1.15–1.40)
Calcium, Ion: 1.09 mmol/L — ABNORMAL LOW (ref 1.15–1.40)
HCT: 24 % — ABNORMAL LOW (ref 36.0–46.0)
HCT: 25 % — ABNORMAL LOW (ref 36.0–46.0)
Hemoglobin: 8.2 g/dL — ABNORMAL LOW (ref 12.0–15.0)
Hemoglobin: 8.5 g/dL — ABNORMAL LOW (ref 12.0–15.0)
O2 Saturation: 100 %
O2 Saturation: 100 %
PH ART: 7.344 — AB (ref 7.350–7.450)
Patient temperature: 37
Potassium: 3.4 mmol/L — ABNORMAL LOW (ref 3.5–5.1)
Potassium: 3.7 mmol/L (ref 3.5–5.1)
SODIUM: 136 mmol/L (ref 135–145)
Sodium: 134 mmol/L — ABNORMAL LOW (ref 135–145)
TCO2: 21 mmol/L — AB (ref 22–32)
TCO2: 23 mmol/L (ref 22–32)
pCO2 arterial: 33.4 mmHg (ref 32.0–48.0)
pCO2 arterial: 40.6 mmHg (ref 32.0–48.0)
pH, Arterial: 7.376 (ref 7.350–7.450)
pO2, Arterial: 247 mmHg — ABNORMAL HIGH (ref 83.0–108.0)
pO2, Arterial: 468 mmHg — ABNORMAL HIGH (ref 83.0–108.0)

## 2019-02-21 LAB — CBC
HCT: 30.3 % — ABNORMAL LOW (ref 36.0–46.0)
Hemoglobin: 9.2 g/dL — ABNORMAL LOW (ref 12.0–15.0)
MCH: 24.2 pg — ABNORMAL LOW (ref 26.0–34.0)
MCHC: 30.4 g/dL (ref 30.0–36.0)
MCV: 79.7 fL — ABNORMAL LOW (ref 80.0–100.0)
Platelets: 402 10*3/uL — ABNORMAL HIGH (ref 150–400)
RBC: 3.8 MIL/uL — ABNORMAL LOW (ref 3.87–5.11)
RDW: 14.9 % (ref 11.5–15.5)
WBC: 19.2 10*3/uL — ABNORMAL HIGH (ref 4.0–10.5)
nRBC: 0 % (ref 0.0–0.2)

## 2019-02-21 LAB — POCT I-STAT EG7
Acid-base deficit: 4 mmol/L — ABNORMAL HIGH (ref 0.0–2.0)
Bicarbonate: 21 mmol/L (ref 20.0–28.0)
CALCIUM ION: 1.13 mmol/L — AB (ref 1.15–1.40)
HCT: 29 % — ABNORMAL LOW (ref 36.0–46.0)
Hemoglobin: 9.9 g/dL — ABNORMAL LOW (ref 12.0–15.0)
O2 Saturation: 92 %
Potassium: 3.9 mmol/L (ref 3.5–5.1)
Sodium: 135 mmol/L (ref 135–145)
TCO2: 22 mmol/L (ref 22–32)
pCO2, Ven: 36.6 mmHg — ABNORMAL LOW (ref 44.0–60.0)
pH, Ven: 7.367 (ref 7.250–7.430)
pO2, Ven: 66 mmHg — ABNORMAL HIGH (ref 32.0–45.0)

## 2019-02-21 LAB — URINALYSIS, ROUTINE W REFLEX MICROSCOPIC
Bilirubin Urine: NEGATIVE
Glucose, UA: NEGATIVE mg/dL
KETONES UR: 5 mg/dL — AB
LEUKOCYTE UA: NEGATIVE
Nitrite: NEGATIVE
Protein, ur: NEGATIVE mg/dL
Specific Gravity, Urine: 1.005 (ref 1.005–1.030)
pH: 6 (ref 5.0–8.0)

## 2019-02-21 LAB — BASIC METABOLIC PANEL
Anion gap: 7 (ref 5–15)
BUN: 10 mg/dL (ref 8–23)
CO2: 22 mmol/L (ref 22–32)
CREATININE: 0.36 mg/dL — AB (ref 0.44–1.00)
Calcium: 7.8 mg/dL — ABNORMAL LOW (ref 8.9–10.3)
Chloride: 108 mmol/L (ref 98–111)
GFR calc Af Amer: >60 mL/min (ref >60)
GFR calc non Af Amer: >60 mL/min (ref >60)
GLUCOSE: 120 mg/dL — AB (ref 70–99)
Potassium: 3.9 mmol/L (ref 3.5–5.1)
Sodium: 137 mmol/L (ref 135–145)

## 2019-02-21 LAB — ABO/RH: ABO/RH(D): A POS

## 2019-02-21 LAB — HEMOGLOBIN AND HEMATOCRIT, BLOOD
HCT: 30.1 % — ABNORMAL LOW (ref 36.0–46.0)
Hemoglobin: 8.7 g/dL — ABNORMAL LOW (ref 12.0–15.0)

## 2019-02-21 LAB — MRSA PCR SCREENING: MRSA by PCR: NEGATIVE

## 2019-02-21 MED ORDER — SODIUM CHLORIDE 0.9 % IV BOLUS
500.0000 mL | Freq: Once | INTRAVENOUS | Status: AC
  Administered 2019-02-21: 500 mL via INTRAVENOUS

## 2019-02-21 MED ORDER — ONDANSETRON HCL 4 MG/2ML IJ SOLN
4.0000 mg | Freq: Four times a day (QID) | INTRAMUSCULAR | Status: DC | PRN
  Administered 2019-02-25 – 2019-03-04 (×6): 4 mg via INTRAVENOUS
  Filled 2019-02-21 (×7): qty 2

## 2019-02-21 MED ORDER — OXYCODONE HCL 5 MG PO TABS
5.0000 mg | ORAL_TABLET | Freq: Four times a day (QID) | ORAL | Status: DC | PRN
  Administered 2019-02-21 – 2019-02-22 (×2): 5 mg via ORAL
  Filled 2019-02-21 (×3): qty 1

## 2019-02-21 MED ORDER — SODIUM CHLORIDE 0.9 % IV BOLUS
1000.0000 mL | Freq: Once | INTRAVENOUS | Status: AC
  Administered 2019-02-21: 1000 mL via INTRAVENOUS

## 2019-02-21 MED ORDER — DIPHENHYDRAMINE HCL 12.5 MG/5ML PO ELIX: 12.5000 mg | ORAL_SOLUTION | Freq: Four times a day (QID) | ORAL | Status: DC | PRN

## 2019-02-21 MED ORDER — HYDROMORPHONE HCL 1 MG/ML IJ SOLN
INTRAMUSCULAR | Status: AC
  Filled 2019-02-21: qty 1

## 2019-02-21 MED ORDER — HEPARIN SODIUM (PORCINE) 5000 UNIT/ML IJ SOLN
5000.0000 [IU] | Freq: Three times a day (TID) | INTRAMUSCULAR | Status: DC
  Administered 2019-02-21 – 2019-03-05 (×37): 5000 [IU] via SUBCUTANEOUS
  Filled 2019-02-21 (×37): qty 1

## 2019-02-21 MED ORDER — METOPROLOL TARTRATE 5 MG/5ML IV SOLN: 5.0000 mg | Freq: Four times a day (QID) | INTRAVENOUS | Status: DC | PRN

## 2019-02-21 MED ORDER — LACTATED RINGERS IV SOLN
INTRAVENOUS | Status: DC
  Administered 2019-02-20: via INTRAVENOUS

## 2019-02-21 MED ORDER — IBUPROFEN 200 MG PO TABS
600.0000 mg | ORAL_TABLET | Freq: Four times a day (QID) | ORAL | Status: DC | PRN
  Administered 2019-02-21 – 2019-02-22 (×2): 600 mg via ORAL
  Filled 2019-02-21: qty 3

## 2019-02-21 MED ORDER — DIPHENHYDRAMINE HCL 50 MG/ML IJ SOLN: 12.5000 mg | Freq: Four times a day (QID) | INTRAMUSCULAR | Status: DC | PRN

## 2019-02-21 MED ORDER — SIMETHICONE 80 MG PO CHEW
40.0000 mg | CHEWABLE_TABLET | Freq: Four times a day (QID) | ORAL | Status: DC | PRN
  Administered 2019-03-04: 40 mg via ORAL
  Filled 2019-02-21: qty 1

## 2019-02-21 MED ORDER — THYROID 60 MG PO TABS
90.0000 mg | ORAL_TABLET | Freq: Every day | ORAL | Status: DC
  Administered 2019-02-21 – 2019-02-28 (×6): 90 mg via ORAL
  Filled 2019-02-21 (×10): qty 1

## 2019-02-21 MED ORDER — POTASSIUM CHLORIDE 2 MEQ/ML IV SOLN
INTRAVENOUS | Status: DC
  Filled 2019-02-21: qty 1000

## 2019-02-21 MED ORDER — HYDROMORPHONE HCL 1 MG/ML IJ SOLN
0.5000 mg | INTRAMUSCULAR | Status: DC | PRN
  Administered 2019-02-21 – 2019-02-23 (×13): 0.5 mg via INTRAVENOUS
  Filled 2019-02-21 (×12): qty 1

## 2019-02-21 MED ORDER — METHOCARBAMOL 1000 MG/10ML IJ SOLN
500.0000 mg | Freq: Three times a day (TID) | INTRAVENOUS | Status: DC
  Administered 2019-02-21 – 2019-02-27 (×17): 500 mg via INTRAVENOUS
  Filled 2019-02-21 (×6): qty 500
  Filled 2019-02-21: qty 5
  Filled 2019-02-21 (×3): qty 500
  Filled 2019-02-21 (×4): qty 5
  Filled 2019-02-21 (×8): qty 500

## 2019-02-21 MED ORDER — IBUPROFEN 200 MG PO TABS
ORAL_TABLET | ORAL | Status: AC
  Filled 2019-02-21: qty 3

## 2019-02-21 MED ORDER — ACETAMINOPHEN 500 MG PO TABS
1000.0000 mg | ORAL_TABLET | Freq: Four times a day (QID) | ORAL | Status: DC
  Filled 2019-02-21 (×2): qty 2

## 2019-02-21 MED ORDER — KCL IN DEXTROSE-NACL 20-5-0.45 MEQ/L-%-% IV SOLN
INTRAVENOUS | Status: DC
  Administered 2019-02-21 – 2019-02-26 (×10): via INTRAVENOUS
  Filled 2019-02-21 (×15): qty 1000

## 2019-02-21 MED ORDER — ONDANSETRON 4 MG PO TBDP
4.0000 mg | ORAL_TABLET | Freq: Four times a day (QID) | ORAL | Status: DC | PRN
  Administered 2019-02-25: 4 mg via ORAL
  Filled 2019-02-21 (×2): qty 1

## 2019-02-21 NOTE — Anesthesia Procedure Notes (Signed)
Procedure Name: Intubation Date/Time: 02/20/2019 9:15 PM Performed by: Lissa Morales, CRNA Pre-anesthesia Checklist: Patient identified, Emergency Drugs available, Suction available and Patient being monitored Patient Re-evaluated:Patient Re-evaluated prior to induction Oxygen Delivery Method: Circle system utilized Preoxygenation: Pre-oxygenation with 100% oxygen Induction Type: IV induction Ventilation: Mask ventilation without difficulty Laryngoscope Size: Glidescope and 4 Grade View: Grade III Tube type: Oral Tube size: 7.0 mm Number of attempts: 1 Airway Equipment and Method: Stylet,  Oral airway and Video-laryngoscopy Placement Confirmation: ETT inserted through vocal cords under direct vision,  positive ETCO2 and breath sounds checked- equal and bilateral Secured at: 22 cm Tube secured with: Tape Dental Injury: Teeth and Oropharynx as per pre-operative assessment  Difficulty Due To: Difficulty was anticipated and Difficult Airway- due to anterior larynx

## 2019-02-21 NOTE — Consult Note (Signed)
[place] Nurse ostomy follow up Patient receiving care in [place].  No visitors present. Stoma type/location:  Colostomy, LUQ  Stomal assessment/size: red, moist viewed through ostomy appliance.   Peristomal assessment: Deferred Treatment options for stomal/peristomal skin: Can use a barrier ring if needed. Output:  None, no feces, no gas Ostomy pouching: 2pc. Flat, placed postoperatively. Education provided: Clinical research associate left at bedside.  Patient too sleepy to teach at this time. Enrolled patient in [place] Start Discharge program: No Patient out from emergency surgery in the late night/early morning hours.  No need to remove pouch and initiating teaching at this time is not appropriate. [place] team will follow.  Due to the proximity of the stoma/pouching system to the costal margin, I am recommending a flexible, one piece, flat, cut to fit.  Val Riles, RN, MSN, CWOCN, CNS-BC, pager [phone]

## 2019-02-21 NOTE — Transfer of Care (Signed)
Immediate Anesthesia Transfer of Care Note  Patient: Marie Levy  Procedure(s) Performed: EXPLORATORY LAPAROTOMY (N/A Abdomen) LOWER ANTERIOR RESECTION WITH COLOSTOMY (N/A Abdomen)  Patient Location: PACU  Anesthesia Type:General  Level of Consciousness: awake, alert , oriented and patient cooperative  Airway & Oxygen Therapy: Patient Spontanous Breathing and Patient connected to face mask oxygen  Post-op Assessment: Report given to RN, Post -op Vital signs reviewed and stable and Patient moving all extremities X 4  Post vital signs: stable  Last Vitals:  Vitals Value Taken Time  BP 114/75 02/21/2019 12:00 AM  Temp    Pulse 95 02/21/2019 12:00 AM  Resp 14 02/21/2019 12:00 AM  SpO2 100 % 02/21/2019 12:00 AM  Vitals shown include unvalidated device data.  Last Pain:  Vitals:   02/20/19 1910  TempSrc:   PainSc: 8          Complications: No apparent anesthesia complications

## 2019-02-21 NOTE — Progress Notes (Signed)
Patient ID: Marie Levy, female   DOB: 09/17/1946, 73 y.o.   MRN: [id number]    1 Day Post-Op  Subjective: Patient pretty tired from surgery overnight.  Having quite a bit of pain, but overall ok.  BP has been low.  A-line in the 80s, cuff in the 90s.  Patient states she is normally higher than this, but not high as in HTN.  Objective: Vital signs in last 24 hours: Temp:  [97.6 F (36.4 C)-98.5 F (36.9 C)] 97.6 F (36.4 C) (03/20 0800) Pulse Rate:  [87-110] 94 (03/20 0800) Resp:  [10-25] 12 (03/20 0800) BP: (86-114)/(57-79) 94/58 (03/20 0800) SpO2:  [90 %-100 %] 95 % (03/20 0800) Arterial Line BP: (75-114)/(49-66) 75/50 (03/20 0800) Weight:  [56.2 kg] 56.2 kg (03/19 1724)    Intake/Output from previous day: 03/19 0701 - 03/20 0700 In: 4615 [I.V.:4305; NG/GT:60; IV Piggyback:250] Out: 1285 [Urine:805; Emesis/NG output:200; Drains:80; Blood:200] Intake/Output this shift: No intake/output data recorded.  PE: Heart: reagular Lungs: CTAB Abd: soft, appropriately tender, NGT with no output, midline wound is clean and packed.  JP drain with serous output.  Colostomy with no output.  Stoma is pink and viable. GU: foley in place with yellow urine.  About 50cc currently, unclear when it was last emptied.   Ext: aline in place in L wrist  Lab Results:  Recent Labs    02/20/19 1820  02/20/19 2334 02/21/19 0520  WBC 12.4*  --   --  19.2*  HGB 10.7*   < > 8.5* 9.2*  HCT 35.5*   < > 25.0* 30.3*  PLT 422*  --   --  402*   < > = values in this interval not displayed.   BMET Recent Labs    02/20/19 1820  02/20/19 2334 02/21/19 0520  NA 134*   < > 134* 137  K 3.5   < > 3.7 3.9  CL 101  --   --  108  CO2 24  --   --  22  GLUCOSE 101*  --   --  120*  BUN 13  --   --  10  CREATININE 0.64  --   --  0.36*  CALCIUM 7.9*  --   --  7.8*   < > = values in this interval not displayed.   PT/INR No results for input(s): LABPROT, INR in the last 72 hours. CMP     Component  Value Date/Time   NA 137 02/21/2019 0520   K 3.9 02/21/2019 0520   CL 108 02/21/2019 0520   CO2 22 02/21/2019 0520   GLUCOSE 120 (H) 02/21/2019 0520   BUN 10 02/21/2019 0520   CREATININE 0.36 (L) 02/21/2019 0520   CALCIUM 7.8 (L) 02/21/2019 0520   PROT 6.1 (L) 02/20/2019 1820   ALBUMIN 2.6 (L) 02/20/2019 1820   AST 21 02/20/2019 1820   ALT 13 02/20/2019 1820   ALKPHOS 84 02/20/2019 1820   BILITOT 1.4 (H) 02/20/2019 1820   GFRNONAA >60 02/21/2019 0520   GFRAA >60 02/21/2019 0520   Lipase     Component Value Date/Time   LIPASE 24 02/20/2019 1820       Studies/Results: Ct Abdomen Pelvis W Contrast  Result Date: 02/20/2019 CLINICAL DATA:  73 year old female with increasing abdominal pain. By report, mass was found on recent sigmoidoscopy. EXAM: CT ABDOMEN AND PELVIS WITH CONTRAST TECHNIQUE: Multidetector CT imaging of the abdomen and pelvis was performed using the standard protocol following bolus administration of intravenous contrast.  CONTRAST:  74mL ISOVUE-300 IOPAMIDOL (ISOVUE-300) INJECTION 61% COMPARISON:  CT Abdomen and Pelvis 02/07/2019. FINDINGS: Lower chest: Pneumoperitoneum is now visible under the diaphragm on series 4, image 42. dependent atelectasis. No pericardial or pleural effusion. Hepatobiliary: Small volume of free air around all margins of the liver. Round low-density areas in the liver dome appear stable and are probably benign cysts. Negative gallbladder aside from small volume of adjacent free fluid. No bile duct enlargement. Pancreas: Negative. Spleen: Negative aside from trace free fluid. Adrenals/Urinary Tract: Normal adrenal glands. Bilateral renal enhancement is symmetric. Early bilateral renal pyramid contrast excretion. Decompressed urinary bladder. Stomach/Bowel: The severe distal large bowel stenosis in the area of masslike soft tissue thickening is redemonstrated on series 2, image 71. Just anterior to that area located in the deep pelvis to the right of  midline there is a 24 x 48 by 30 millimeter extraluminal appearing air and fluid collection. See series 2 image 68 and coronal image 62. Generalized inflammatory stranding in the small and large bowel mesentery throughout the abdomen. Intermittent small volume free fluid. Fluid-filled but mostly nondilated small bowel loops. Retained stool in the large bowel upstream of the tumor related stricture. The right colon is decompressed. Decompressed stomach. Vascular/Lymphatic: Aortoiliac calcified atherosclerosis. Major arterial structures are patent. Portal venous system is patent. Reproductive: Negative. Other: Small volume of free fluid in the anterior pelvis on series 2, image 71 with simple fluid density. Small volume of pneumoperitoneum under the lower abdominal wall at the level of the pelvic inlet on image 62. Musculoskeletal: No acute osseous abnormality identified. IMPRESSION: 1. Pneumoperitoneum indicating a perforated loop of bowel. There appears to be a 4 cm extraluminal collection of gas and fluid central in the pelvis anterior to the sigmoid colon (series 2, image 68). This is in the region of distal large bowel tumor as described on 02/07/2019. Critical Value/emergent results were called by telephone at the time of interpretation on 02/20/2019 at 1933 hours to PA-C [name] , who verbally acknowledged these results. 2. Small volume of free fluid and free air scattered in the bilateral abdomen. Generalized mesenteric inflammation. 3. No metastatic disease identified. Electronically Signed   By: Genevie Ann M.D.   On: 02/20/2019 19:43    Anti-infectives: Anti-infectives (From admission, onward)   Start     Dose/Rate Route Frequency Ordered Stop   02/20/19 1945  piperacillin-tazobactam (ZOSYN) IVPB 2.25 g  Status:  Discontinued     2.25 g 100 mL/hr over 30 Minutes Intravenous Every 6 hours 02/20/19 1935 02/20/19 1941   02/20/19 1945  piperacillin-tazobactam (ZOSYN) IVPB 3.375 g     3.375 g 100  mL/hr over 30 Minutes Intravenous  Once 02/20/19 1942 02/20/19 2042       Assessment/Plan POD 1, s/p LAR with new end colostomy for perforated rectal cancer -give another 500cc bolus given persistent hypotension, increase IVFs to 125cc/hr. -UOP in foley seemed minimal, but suspect this has been emptied recently as 800cc documented in the chart.  Cont foley catheter today as she is just out of surgery over night -BID WD dressing changes -WOC consult for new colostomy -routine JP drain care -mobilize and pulm toilet -PT eval and treatment as patient lives by herself on the 3rd floor of an apartment building. -labs in am -multi-modal pain control, tylenol, ibuprofen, robaxin, oxy, dilaudid prn -may have meds and clamp NGT.  Cont NGT, but she has nothing out of this currently.  May not need it terribly long.  -keep in  ICU/SDU for now to monitor BP and her for today at least.  FEN - NPO/IVFs/NGT VTE - heparin ID - zosyn 3/19 -->   LOS: 1 day    Henreitta Cea , [facility] 02/21/2019, 9:06 AM Pager: [phone]

## 2019-02-21 NOTE — Progress Notes (Signed)
Ccs PA and MD had been notified multiple time for pt. BP being low at times 80's/40's over the course of the day pt. Has received 2.5 L of fluid and BP continues to be low. MD on night shift made aware and night RN made aware.

## 2019-02-21 NOTE — Evaluation (Signed)
Physical Therapy Evaluation Patient Details Name: Marie Levy MRN: [id number] DOB: 1946-05-02 Today's Date: 02/21/2019   History of Present Illness  73 y.o. female with hx of hypothyroidism whom presented to the ED 02/20/19 with complaints of abdominal pain,  recently referred to our office as a referral by Dr. Benson Norway for newly diagnosed rectal cancer - near obstructing; s/p expl lap, lower anterior resection and end colostomy on 02/21/19  Clinical Impression  Pt admitted with above diagnosis. Pt currently with functional limitations due to the deficits listed below (see PT Problem List).  Pt cooperative and motivated; VSS except BP soft at 102/57, mostly asymptomatic. Will continue to follow in acute setting; will recommend HHPT at this time, pt is quite active and independent at her baseline, has a supportive son in the area and it may be much safer for her to look at d/c home rather than a facility at this point;  She has 3 flights of stairs but was managing them without difficulty prior to admission, will work towards this goal.  Pt will benefit from skilled PT to increase their independence and safety with mobility to allow discharge to the venue listed below.       Follow Up Recommendations Home health PT;Supervision - Intermittent    Equipment Recommendations  Other (comment)(TBD)    Recommendations for Other Services       Precautions / Restrictions Precautions Precautions: Fall Precaution Comments: multiple lines, colostomy, JP drain on R  Restrictions Weight Bearing Restrictions: No      Mobility  Bed Mobility Overal bed mobility: Needs Assistance Bed Mobility: Sidelying to Sit;Sit to Sidelying;Rolling Rolling: Min assist Sidelying to sit: Min assist     Sit to sidelying: Min assist General bed mobility comments: assist with LEs on and off bed, cues for log roll  Transfers     Transfers: Sit to/from Stand Sit to Stand: Min assist         General transfer  comment: assist to rise and stabilize, lateral steps along EOB with HHA  Ambulation/Gait             General Gait Details: deferred d/t decr BP  Stairs            Wheelchair Mobility    Modified Rankin (Stroke Patients Only)       Balance Overall balance assessment: Needs assistance   Sitting balance-Leahy Scale: Fair       Standing balance-Leahy Scale: Poor Standing balance comment: reliant on UE and external support                             Pertinent Vitals/Pain Pain Assessment: 0-10 Pain Score: 0-No pain Pain Intervention(s): Monitored during session;Premedicated before session    [place] expects to be discharged to:: Private residence Living Arrangements: Alone Available Help at Discharge: Family Type of Home: (condo ) Home Access: Stairs to enter   Technical brewer of Steps: 3 flights of stairs Home Layout: One level Home Equipment: None      Prior Function Level of Independence: Independent         Comments: does yoga, very active and social; still works part-time, has a Engineer, manufacturing        Extremity/Trunk Assessment   Upper Extremity Assessment Upper Extremity Assessment: Defer to OT evaluation    Lower Extremity Assessment Lower Extremity Assessment: Generalized weakness  Communication   Communication: No difficulties  Cognition Arousal/Alertness: Awake/alert Behavior During Therapy: WFL for tasks assessed/performed Overall Cognitive Status: Within Functional Limits for tasks assessed                                        General Comments      Exercises     Assessment/Plan    PT Assessment Patient needs continued PT services  PT Problem List Decreased activity tolerance;Decreased strength;Decreased mobility;Decreased balance;Decreased knowledge of use of DME;Decreased range of motion       PT Treatment Interventions DME  instruction;Gait training;Functional mobility training;Therapeutic activities;Patient/family education;Stair training;Therapeutic exercise    PT Goals (Current goals can be found in the Care Plan section)  Acute Rehab PT Goals Patient Stated Goal: get better PT Goal Formulation: With patient Time For Goal Achievement: 02/28/19 Potential to Achieve Goals: Good    Frequency Min 3X/week   Barriers to discharge        Co-evaluation               AM-PAC PT "6 Clicks" Mobility  Outcome Measure Help needed turning from your back to your side while in a flat bed without using bedrails?: A Little Help needed moving from lying on your back to sitting on the side of a flat bed without using bedrails?: A Little Help needed moving to and from a bed to a chair (including a wheelchair)?: A Lot Help needed standing up from a chair using your arms (e.g., wheelchair or bedside chair)?: A Lot Help needed to walk in hospital room?: A Lot Help needed climbing 3-5 steps with a railing? : A Lot 6 Click Score: 14    End of Session   Activity Tolerance: Patient tolerated treatment well Patient left: with call bell/phone within reach;in bed;with bed alarm set Nurse Communication: Mobility status PT Visit Diagnosis: Unsteadiness on feet (R26.81);Difficulty in walking, not elsewhere classified (R26.2)    Time: 2409-7353 PT Time Calculation (min) (ACUTE ONLY): 24 min   Charges:   PT Evaluation $PT Eval Low Complexity: 1 Low PT Treatments $Therapeutic Activity: 8-22 mins        Kenyon Ana, PT  Pager: [phone] Acute Rehab Dept [facility]): [phone]   02/21/2019   [facility] 02/21/2019, 3:03 PM

## 2019-02-21 NOTE — Anesthesia Postprocedure Evaluation (Signed)
Anesthesia Post Note  Patient: GURTHA PICKER  Procedure(s) Performed: EXPLORATORY LAPAROTOMY (N/A Abdomen) LOWER ANTERIOR RESECTION WITH COLOSTOMY (N/A Abdomen)     Patient location during evaluation: PACU Anesthesia Type: General Level of consciousness: awake and alert Pain management: pain level controlled Vital Signs Assessment: post-procedure vital signs reviewed and stable Respiratory status: spontaneous breathing, nonlabored ventilation, respiratory function stable and patient connected to nasal cannula oxygen Cardiovascular status: blood pressure returned to baseline and stable Postop Assessment: no apparent nausea or vomiting Anesthetic complications: no    Last Vitals:  Vitals:   02/21/19 0648 02/21/19 0650  BP:    Pulse: 93 94  Resp: 19 15  Temp:    SpO2: 94% 95%    Last Pain:  Vitals:   02/21/19 0636  TempSrc:   PainSc: 6                  Catalina Gravel

## 2019-02-22 LAB — BASIC METABOLIC PANEL
Anion gap: 4 — ABNORMAL LOW (ref 5–15)
BUN: 10 mg/dL (ref 8–23)
CO2: 23 mmol/L (ref 22–32)
Calcium: 7.9 mg/dL — ABNORMAL LOW (ref 8.9–10.3)
Chloride: 107 mmol/L (ref 98–111)
Creatinine, Ser: 0.52 mg/dL (ref 0.44–1.00)
GFR calc non Af Amer: >60 mL/min (ref >60)
Glucose, Bld: 165 mg/dL — ABNORMAL HIGH (ref 70–99)
Potassium: 4.4 mmol/L (ref 3.5–5.1)
Sodium: 134 mmol/L — ABNORMAL LOW (ref 135–145)

## 2019-02-22 LAB — CBC
HCT: 30.6 % — ABNORMAL LOW (ref 36.0–46.0)
HEMOGLOBIN: 8.8 g/dL — AB (ref 12.0–15.0)
MCH: 23.8 pg — ABNORMAL LOW (ref 26.0–34.0)
MCHC: 28.8 g/dL — ABNORMAL LOW (ref 30.0–36.0)
MCV: 82.9 fL (ref 80.0–100.0)
Platelets: 401 10*3/uL — ABNORMAL HIGH (ref 150–400)
RBC: 3.69 MIL/uL — AB (ref 3.87–5.11)
RDW: 15.3 % (ref 11.5–15.5)
WBC: 28.1 10*3/uL — ABNORMAL HIGH (ref 4.0–10.5)
nRBC: 0 % (ref 0.0–0.2)

## 2019-02-22 MED ORDER — PHENOL 1.4 % MT LIQD
1.0000 | OROMUCOSAL | Status: DC | PRN
  Filled 2019-02-22: qty 177

## 2019-02-22 NOTE — Progress Notes (Signed)
  Progress Note: General Surgery Service   Assessment/Plan: Active Problems:   Perforated viscus POD 1, s/p LAR with new end colostomy for perforated rectal cancer  Cont foley catheter today for volume concern -BID WD dressing changes -WOC consult for new colostomy -routine JP drain care -mobilize and pulm toilet -PT eval and treatment as patient lives by herself on the 3rd floor of an apartment building. -labs in am -multi-modal pain control, tylenol, ibuprofen, robaxin, oxy, dilaudid prn -may have meds and clamp NGT.  Cont NGT -keep in ICU/SDU for now to monitor BP and her for today at least.  Hypotension- received multiple fluid boluses yesterday, BP 110/70 at bedside this morning -continue a line -continue to monitor -creatinine 0.36 -> 0.52  FEN - NPO/IVFs/NGT VTE - heparin ID - zosyn 3/19 -->    LOS: 2 days  Chief Complaint/Subjective: Some pain in abdomen, no nausea  Objective: Vital signs in last 24 hours: Temp:  [97.5 F (36.4 C)-98.2 F (36.8 C)] 98.2 F (36.8 C) (03/21 0400) Pulse Rate:  [83-99] 87 (03/21 0700) Resp:  [5-23] 8 (03/21 0700) BP: (73-102)/(46-67) 73/46 (03/20 1900) SpO2:  [89 %-98 %] 95 % (03/21 0700) Arterial Line BP: (74-125)/(45-84) 85/60 (03/21 0700)    Intake/Output from previous day: 03/20 0701 - 03/21 0700 In: 3701.3 [I.V.:2506.2; NG/GT:560; IV Piggyback:635.2] Out: 1575 [Urine:850; Emesis/NG output:700; Drains:25] Intake/Output this shift: No intake/output data recorded.  Lungs: nonlabored breathing, room air  Cardiovascular: RRR  Abd: soft, ATTP, open incision with clean base  Extremities: no edema  Neuro: AOx4  Lab Results: CBC  Recent Labs    02/21/19 0520 02/21/19 1848 02/22/19 0302  WBC 19.2*  --  28.1*  HGB 9.2* 8.7* 8.8*  HCT 30.3* 30.1* 30.6*  PLT 402*  --  401*   BMET Recent Labs    02/21/19 0520 02/22/19 0302  NA 137 134*  K 3.9 4.4  CL 108 107  CO2 22 23  GLUCOSE 120* 165*  BUN 10 10   CREATININE 0.36* 0.52  CALCIUM 7.8* 7.9*   PT/INR No results for input(s): LABPROT, INR in the last 72 hours. ABG Recent Labs    02/20/19 2232 02/20/19 2334  PHART 7.376 7.344*  HCO3 19.6* 22.1    Studies/Results:  Anti-infectives: Anti-infectives (From admission, onward)   Start     Dose/Rate Route Frequency Ordered Stop   02/20/19 1945  piperacillin-tazobactam (ZOSYN) IVPB 2.25 g  Status:  Discontinued     2.25 g 100 mL/hr over 30 Minutes Intravenous Every 6 hours 02/20/19 1935 02/20/19 1941   02/20/19 1945  piperacillin-tazobactam (ZOSYN) IVPB 3.375 g     3.375 g 100 mL/hr over 30 Minutes Intravenous  Once 02/20/19 1942 02/20/19 2042      Medications: Scheduled Meds: . acetaminophen  1,000 mg Oral Q6H  . heparin  5,000 Units Subcutaneous Q8H  . thyroid  90 mg Oral Daily   Continuous Infusions: . dextrose 5 % and 0.45 % NaCl with KCl 20 mEq/L 125 mL/hr at 02/22/19 0700  . methocarbamol (ROBAXIN) IV Stopped (02/22/19 0531)   PRN Meds:.diphenhydrAMINE **OR** diphenhydrAMINE, HYDROmorphone (DILAUDID) injection, ibuprofen, metoprolol tartrate, ondansetron **OR** ondansetron (ZOFRAN) IV, oxyCODONE, simethicone  Mickeal Skinner, MD [facility]

## 2019-02-22 NOTE — Progress Notes (Addendum)
Physical Therapy Treatment Patient Details Name: Marie Levy MRN: [id number] DOB: 10/29/1946 Today's Date: 02/22/2019    History of Present Illness 73 y.o. female with hx of hypothyroidism whom presented to the ED 02/20/19 with complaints of abdominal pain,  recently referred to our office as a referral by Dr. Benson Norway for newly diagnosed rectal cancer - near obstructing; s/p expl lap, lower anterior resection and end colostomy on 02/21/19    PT Comments    The patient is very  Motivated to mobilize. Distance  Limited due to lines and monitor. Continue PT  For mobility. Recommend OT consult.   Follow Up Recommendations  Home health PT;Supervision - Intermittent     Equipment Recommendations  Other (comment)    Recommendations for Other Services   OT    Precautions / Restrictions Precautions Precautions: Fall Precaution Comments: multiple lines, colostomy, JP drain on R , NG suction-ok to clamp BP soft    Mobility  Bed Mobility   Bed Mobility: Rolling;Sidelying to Sit;Sit to Sidelying Rolling: Min assist Sidelying to sit: Min assist     Sit to sidelying: Min assist General bed mobility comments: assist with LEs on and off bed, cues for log roll, gentle support of 1 UE when sitting uopright from side, could not reach the rail comfortably  Transfers Overall transfer level: Needs assistance Equipment used: Rolling walker (2 wheeled) Transfers: Sit to/from Stand Sit to Stand: Min assist         General transfer comment: assist to rise and stabilize,   Ambulation/Gait Ambulation/Gait assistance: Min assist;+2 safety/equipment Gait Distance (Feet): 20 Feet Assistive device: Rolling walker (2 wheeled)   Gait velocity: decr   General Gait Details: ambulated from bed due to being tethered to the monitor( A LINE) so walked forward, then to foot of bed and back to head of bed. At times able to stand and step withput UE support   Stairs             Wheelchair  Mobility    Modified Rankin (Stroke Patients Only)       Balance Overall balance assessment: Needs assistance Sitting-balance support: No upper extremity supported;Feet supported Sitting balance-Leahy Scale: Fair     Standing balance support: Bilateral upper extremity supported Standing balance-Leahy Scale: Fair                              Cognition Arousal/Alertness: Awake/alert                                            Exercises      General Comments        Pertinent Vitals/Pain Pain Score: 3  Pain Location: right  side when  oving Pain Intervention(s): Monitored during session    Home Living                      Prior Function            PT Goals (current goals can now be found in the care plan section) Progress towards PT goals: Progressing toward goals    Frequency    Min 3X/week      PT Plan Current plan remains appropriate    Co-evaluation              AM-PAC PT "6 Clicks" Mobility  Outcome Measure  Help needed turning from your back to your side while in a flat bed without using bedrails?: A Little Help needed moving from lying on your back to sitting on the side of a flat bed without using bedrails?: A Little Help needed moving to and from a bed to a chair (including a wheelchair)?: A Lot Help needed standing up from a chair using your arms (e.g., wheelchair or bedside chair)?: A Lot Help needed to walk in hospital room?: A Lot Help needed climbing 3-5 steps with a railing? : A Lot 6 Click Score: 14    End of Session   Activity Tolerance: Patient tolerated treatment well Patient left: in bed;with call bell/phone within reach Nurse Communication: Mobility status PT Visit Diagnosis: Unsteadiness on feet (R26.81);Difficulty in walking, not elsewhere classified (R26.2)     Time: 5188-4166 PT Time Calculation (min) (ACUTE ONLY): 31 min  Charges:  $Gait Training: 23-37 mins                      [place] Pager [phone] Office [phone]    Claretha Cooper 02/22/2019, 1:10 PM

## 2019-02-23 LAB — CBC
HCT: 29.3 % — ABNORMAL LOW (ref 36.0–46.0)
Hemoglobin: 8.5 g/dL — ABNORMAL LOW (ref 12.0–15.0)
MCH: 23.5 pg — ABNORMAL LOW (ref 26.0–34.0)
MCHC: 29 g/dL — ABNORMAL LOW (ref 30.0–36.0)
MCV: 80.9 fL (ref 80.0–100.0)
Platelets: 395 10*3/uL (ref 150–400)
RBC: 3.62 MIL/uL — AB (ref 3.87–5.11)
RDW: 15.2 % (ref 11.5–15.5)
WBC: 28.2 10*3/uL — ABNORMAL HIGH (ref 4.0–10.5)
nRBC: 0 % (ref 0.0–0.2)

## 2019-02-23 LAB — BASIC METABOLIC PANEL
Anion gap: 6 (ref 5–15)
BUN: 7 mg/dL — ABNORMAL LOW (ref 8–23)
CO2: 24 mmol/L (ref 22–32)
Calcium: 7.7 mg/dL — ABNORMAL LOW (ref 8.9–10.3)
Chloride: 105 mmol/L (ref 98–111)
Creatinine, Ser: 0.48 mg/dL (ref 0.44–1.00)
GFR calc Af Amer: >60 mL/min (ref >60)
Glucose, Bld: 124 mg/dL — ABNORMAL HIGH (ref 70–99)
Potassium: 3.9 mmol/L (ref 3.5–5.1)
Sodium: 135 mmol/L (ref 135–145)

## 2019-02-23 MED ORDER — CHLORHEXIDINE GLUCONATE CLOTH 2 % EX PADS
6.0000 | MEDICATED_PAD | Freq: Every day | CUTANEOUS | Status: DC
  Administered 2019-02-23: 6 via TOPICAL

## 2019-02-23 MED ORDER — HYDROMORPHONE HCL 1 MG/ML IJ SOLN
1.0000 mg | INTRAMUSCULAR | Status: DC | PRN
  Administered 2019-02-23 – 2019-02-28 (×6): 1 mg via INTRAVENOUS
  Filled 2019-02-23 (×7): qty 1

## 2019-02-23 MED ORDER — CHLORHEXIDINE GLUCONATE 0.12 % MT SOLN
15.0000 mL | Freq: Two times a day (BID) | OROMUCOSAL | Status: DC
  Administered 2019-02-23 – 2019-03-05 (×20): 15 mL via OROMUCOSAL
  Filled 2019-02-23 (×20): qty 15

## 2019-02-23 MED ORDER — ORAL CARE MOUTH RINSE
15.0000 mL | Freq: Two times a day (BID) | OROMUCOSAL | Status: DC
  Administered 2019-02-23 – 2019-03-05 (×12): 15 mL via OROMUCOSAL

## 2019-02-23 NOTE — Progress Notes (Signed)
PT Cancellation Note  Patient Details Name: CHASSITY LUDKE MRN: [id number] DOB: 07-Sep-1946   Cancelled Treatment:    Reason Eval/Treat Not Completed: Attempted PT tx session-pt declined to participate on today. Will check back another day.    Weston Anna, PT Acute Rehabilitation Services Pager: [phone] Office: [phone]

## 2019-02-23 NOTE — Progress Notes (Signed)
  Progress Note: General Surgery Service   Assessment/Plan: Active Problems:   Perforated viscus  POD 3, s/p LAR with new end colostomy for perforated rectal cancer Cont foley catheter today for volume concern -BID WD dressing changes -WOC consult for new colostomy -routine JP drain care -mobilize and pulm toilet -PT eval and treatment as patient lives by herself on the 3rd floor of an apartment building. -labs in am -multi-modal pain control, tylenol, ibuprofen, robaxin, oxy, dilaudid prn -may have meds and clamp NGT. Clamp NG today, ok with ice chips -keep in ICU/SDU for now to monitor BP and her for today at least.  Hypotension- improved -continue a line -continue to monitor -creatinine 0.52-->0.48  FEN -NPO/IVFs/NGT VTE -heparin ID -zosyn 3/19 -->   LOS: 3 days  Chief Complaint/Subjective: Pain controlled, no nausea or vomiting  Objective: Vital signs in last 24 hours: Temp:  [97.5 F (36.4 C)-98.6 F (37 C)] 98.2 F (36.8 C) (03/22 0755) Pulse Rate:  [74-100] 74 (03/22 0435) Resp:  [6-19] 14 (03/22 0435) BP: (95-133)/(57-81) 106/67 (03/22 0400) SpO2:  [94 %-98 %] 98 % (03/22 0435) Arterial Line BP: (101-145)/(54-77) 111/56 (03/22 0435) Last BM Date: 02/20/19  Intake/Output from previous day: 03/21 0701 - 03/22 0700 In: 2670.2 [I.V.:2571.8; IV Piggyback:98.4] Out: 2868.5 [Urine:2100; Emesis/NG output:750; Drains:18.5] Intake/Output this shift: No intake/output data recorded.  Lungs: nonlabored  Cardiovascular: RRR  Abd: soft, ATTP, incision open with clean base, ostomy pink with no gas or liquids  Extremities: no edema  Neuro: AOx4  Lab Results: CBC  Recent Labs    02/22/19 0302 02/23/19 0339  WBC 28.1* 28.2*  HGB 8.8* 8.5*  HCT 30.6* 29.3*  PLT 401* 395   BMET Recent Labs    02/22/19 0302 02/23/19 0339  NA 134* 135  K 4.4 3.9  CL 107 105  CO2 23 24  GLUCOSE 165* 124*  BUN 10 7*  CREATININE 0.52 0.48  CALCIUM 7.9* 7.7*   PT/INR No results for input(s): LABPROT, INR in the last 72 hours. ABG Recent Labs    02/20/19 2232 02/20/19 2334  PHART 7.376 7.344*  HCO3 19.6* 22.1    Studies/Results:  Anti-infectives: Anti-infectives (From admission, onward)   Start     Dose/Rate Route Frequency Ordered Stop   02/20/19 1945  piperacillin-tazobactam (ZOSYN) IVPB 2.25 g  Status:  Discontinued     2.25 g 100 mL/hr over 30 Minutes Intravenous Every 6 hours 02/20/19 1935 02/20/19 1941   02/20/19 1945  piperacillin-tazobactam (ZOSYN) IVPB 3.375 g     3.375 g 100 mL/hr over 30 Minutes Intravenous  Once 02/20/19 1942 02/20/19 2042      Medications: Scheduled Meds: . acetaminophen  1,000 mg Oral Q6H  . chlorhexidine  15 mL Mouth Rinse BID  . Chlorhexidine Gluconate Cloth  6 each Topical Daily  . heparin  5,000 Units Subcutaneous Q8H  . mouth rinse  15 mL Mouth Rinse q12n4p  . thyroid  90 mg Oral Daily   Continuous Infusions: . dextrose 5 % and 0.45 % NaCl with KCl 20 mEq/L 125 mL/hr at 02/23/19 0237  . methocarbamol (ROBAXIN) IV Stopped (02/23/19 0644)   PRN Meds:.diphenhydrAMINE **OR** diphenhydrAMINE, HYDROmorphone (DILAUDID) injection, ibuprofen, metoprolol tartrate, ondansetron **OR** ondansetron (ZOFRAN) IV, oxyCODONE, phenol, simethicone  Mickeal Skinner, MD [facility]

## 2019-02-24 DIAGNOSIS — C7951 Secondary malignant neoplasm of bone: Secondary | ICD-10-CM | POA: Insufficient documentation

## 2019-02-24 DIAGNOSIS — C20 Malignant neoplasm of rectum: Secondary | ICD-10-CM | POA: Insufficient documentation

## 2019-02-24 LAB — COMPREHENSIVE METABOLIC PANEL
ALT: 12 U/L (ref 0–44)
ANION GAP: 7 (ref 5–15)
AST: 14 U/L — ABNORMAL LOW (ref 15–41)
Albumin: 1.7 g/dL — ABNORMAL LOW (ref 3.5–5.0)
Alkaline Phosphatase: 71 U/L (ref 38–126)
BUN: <5 mg/dL — ABNORMAL LOW (ref 8–23)
CO2: 26 mmol/L (ref 22–32)
Calcium: 7.7 mg/dL — ABNORMAL LOW (ref 8.9–10.3)
Chloride: 96 mmol/L — ABNORMAL LOW (ref 98–111)
Creatinine, Ser: 0.54 mg/dL (ref 0.44–1.00)
GFR calc Af Amer: >60 mL/min (ref >60)
GFR calc non Af Amer: >60 mL/min (ref >60)
Glucose, Bld: 123 mg/dL — ABNORMAL HIGH (ref 70–99)
POTASSIUM: 4.1 mmol/L (ref 3.5–5.1)
SODIUM: 129 mmol/L — AB (ref 135–145)
Total Bilirubin: 0.5 mg/dL (ref 0.3–1.2)
Total Protein: 4.9 g/dL — ABNORMAL LOW (ref 6.5–8.1)

## 2019-02-24 LAB — CBC
HCT: 32.6 % — ABNORMAL LOW (ref 36.0–46.0)
Hemoglobin: 9.7 g/dL — ABNORMAL LOW (ref 12.0–15.0)
MCH: 23.4 pg — ABNORMAL LOW (ref 26.0–34.0)
MCHC: 29.8 g/dL — AB (ref 30.0–36.0)
MCV: 78.7 fL — ABNORMAL LOW (ref 80.0–100.0)
Platelets: 464 10*3/uL — ABNORMAL HIGH (ref 150–400)
RBC: 4.14 MIL/uL (ref 3.87–5.11)
RDW: 15.5 % (ref 11.5–15.5)
WBC: 23.5 10*3/uL — ABNORMAL HIGH (ref 4.0–10.5)
nRBC: 0 % (ref 0.0–0.2)

## 2019-02-24 MED ORDER — OXYCODONE HCL 5 MG/5ML PO SOLN
5.0000 mg | ORAL | Status: DC | PRN
  Administered 2019-02-24 – 2019-02-25 (×3): 5 mg via ORAL
  Filled 2019-02-24 (×3): qty 5

## 2019-02-24 MED ORDER — IBUPROFEN 100 MG/5ML PO SUSP
600.0000 mg | Freq: Four times a day (QID) | ORAL | Status: DC | PRN
  Administered 2019-02-24: 600 mg via ORAL
  Filled 2019-02-24 (×2): qty 30

## 2019-02-24 MED ORDER — ACETAMINOPHEN 160 MG/5ML PO SOLN
650.0000 mg | Freq: Four times a day (QID) | ORAL | Status: DC | PRN
  Filled 2019-02-24: qty 20.3

## 2019-02-24 NOTE — Progress Notes (Signed)
[facility] Progress Note  4 Days Post-Op  Subjective: CC: abdominal pain Patient complaining of abdominal pain, had difficulty swallowing pill for PO pain control overnight and taking only IV pain control now. Did not get up yesterday but thinks she may be passing some flatus via stoma. Tolerating NGT clamp and sips/ice chips.  Febrile overnight Tmax 100.3, patient denies cough or generalized body aches.   Objective: Vital signs in last 24 hours: Temp:  [98.2 F (36.8 C)-100.3 F (37.9 C)] 100.1 F (37.8 C) (03/23 0428) Pulse Rate:  [74-104] 98 (03/23 0600) Resp:  [10-27] 11 (03/23 0600) BP: (108-137)/(60-73) 108/66 (03/23 0600) SpO2:  [92 %-97 %] 96 % (03/23 0600) Arterial Line BP: (119-127)/(59-70) 127/67 (03/22 1126) Last BM Date: 02/20/19  Intake/Output from previous day: 03/22 0701 - 03/23 0700 In: 2873.1 [I.V.:2673.1; IV Piggyback:200] Out: 5462 [Urine:3750; Drains:24] Intake/Output this shift: No intake/output data recorded.  PE: Gen:  Alert, NAD, pleasant Card:  Regular rate and rhythm Pulm:  Normal effort, clear to auscultation bilaterally Abd: Soft, generalized ttp, non-distended, BS hypoactive, midline incision with some slough in base, stoma pink, drain with SS looking fluid  Skin: warm and dry, no rashes  Psych: A&Ox3   Lab Results:  Recent Labs    02/22/19 0302 02/23/19 0339  WBC 28.1* 28.2*  HGB 8.8* 8.5*  HCT 30.6* 29.3*  PLT 401* 395   BMET Recent Labs    02/22/19 0302 02/23/19 0339  NA 134* 135  K 4.4 3.9  CL 107 105  CO2 23 24  GLUCOSE 165* 124*  BUN 10 7*  CREATININE 0.52 0.48  CALCIUM 7.9* 7.7*   PT/INR No results for input(s): LABPROT, INR in the last 72 hours. CMP     Component Value Date/Time   NA 135 02/23/2019 0339   K 3.9 02/23/2019 0339   CL 105 02/23/2019 0339   CO2 24 02/23/2019 0339   GLUCOSE 124 (H) 02/23/2019 0339   BUN 7 (L) 02/23/2019 0339   CREATININE 0.48 02/23/2019 0339   CALCIUM 7.7 (L)  02/23/2019 0339   PROT 6.1 (L) 02/20/2019 1820   ALBUMIN 2.6 (L) 02/20/2019 1820   AST 21 02/20/2019 1820   ALT 13 02/20/2019 1820   ALKPHOS 84 02/20/2019 1820   BILITOT 1.4 (H) 02/20/2019 1820   GFRNONAA >60 02/23/2019 0339   GFRAA >60 02/23/2019 0339   Lipase     Component Value Date/Time   LIPASE 24 02/20/2019 1820       Studies/Results: No results found.  Anti-infectives: Anti-infectives (From admission, onward)   Start     Dose/Rate Route Frequency Ordered Stop   02/20/19 1945  piperacillin-tazobactam (ZOSYN) IVPB 2.25 g  Status:  Discontinued     2.25 g 100 mL/hr over 30 Minutes Intravenous Every 6 hours 02/20/19 1935 02/20/19 1941   02/20/19 1945  piperacillin-tazobactam (ZOSYN) IVPB 3.375 g     3.375 g 100 mL/hr over 30 Minutes Intravenous  Once 02/20/19 1942 02/20/19 2042       Assessment/Plan Perforated viscus  POD 4, s/p LAR with new end colostomy for perforated rectal cancer - d/c foley -TID WD dressing changes -WOC following -routine JP drain care -mobilize!!! and pulm toilet -PT eval and treatment as patient lives by herself on the 3rd floor of an apartment building. -repeat labs in am - WBC remains elevated at 28.2 and patient with low-grade fever overnight -changed PO pain medications to solution -d/c NGT and start CLD  Hypotension- improved  FEN -  d/c NGT and start CLD; add colace BID for bowel regimen  VTE - SQheparin ID -zosyn 3/19 >>  Ok to transfer to med-surg floor  LOS: 4 days    Brigid Re , [facility] Surgery 02/24/2019, 7:54 AM Pager: [phone] Consults: [phone]

## 2019-02-24 NOTE — Consult Note (Signed)
[place] Nurse ostomy follow up Stoma type/location: LUQ colostomy Stomal assessment/size: 1 3/8" edematous Peristomal assessment: 1 piece flat pouch is in place.  Patient states no leaks, nurse placed this pouch over the weekend, she states.  No output.   Treatment options for stomal/peristomal skin: Agrees to pouch change in the AM.  She is groggy today.  She states she lives alone with her cat and will be caring for herself. Informed that I will be teaching her independent self care and we will begin in the AM.  She agrees.  Output None Ostomy pouching: 1pc. flat Education provided: Discussed twice weekly pouch changes,  Showering and activity.  Life with an ostomy overview. Emptying when 1/3 full once function starts.  Enrolled patient in [place] Start Discharge program: /No [facility] team will follow.  Domenic Moras MSN, RN, FNP-BC CWON Wound, Ostomy, Continence Nurse Pager [phone]

## 2019-02-24 NOTE — Progress Notes (Signed)
Pt refused tylenol stating Tylenol is not going to work and it has never worked for her pain.

## 2019-02-24 NOTE — Care Management Important Message (Signed)
Important Message  Patient Details  Name: Marie Levy MRN: [id number] Date of Birth: 04-28-1946   Medicare Important Message Given:  Yes    Kerin Salen 02/24/2019, 11:26 AMImportant Message  Patient Details  Name: Marie Levy MRN: [id number] Date of Birth: 11-23-1946   Medicare Important Message Given:  Yes    Kerin Salen 02/24/2019, 11:26 AM

## 2019-02-24 NOTE — Progress Notes (Signed)
Physical Therapy Treatment Patient Details Name: Marie Levy MRN: [id number] DOB: Jun 28, 1946 Today's Date: 02/24/2019    History of Present Illness 73 y.o. female with hx of hypothyroidism whom presented to the ED 02/20/19 with complaints of abdominal pain,  recently referred to our office as a referral by Dr. Benson Norway for newly diagnosed rectal cancer - near obstructing; s/p expl lap, lower anterior resection and end colostomy on 02/21/19    PT Comments    Assisted from recliner to amb to bathroom.  General transfer comment: assist to rise and stabilize,   Also assisted on off toilet.  Assisted with amb a greater distance in hallway.  General Gait Details: tolerated an increased distance.  Slow but steady gait.  Assisted back to bed per pt request to rest.  General bed mobility comments: assist with B LE up onto bed due to ABD pain.  Increased time to position to comfort. Pt lives home alone with her cat and stated she has to go up three flights of stairs.    Follow Up Recommendations  Home health PT;Supervision - Intermittent     Equipment Recommendations       Recommendations for Other Services       Precautions / Restrictions Precautions Precautions: Fall Precaution Comments: recent ABD surgery, colostomy, R LQ drain Restrictions Weight Bearing Restrictions: No    Mobility  Bed Mobility Overal bed mobility: Needs Assistance Bed Mobility: Sit to Supine           General bed mobility comments: assist with B LE up onto bed due to ABD pain.  Increased time to position to comfort.  Transfers Overall transfer level: Needs assistance Equipment used: Rolling walker (2 wheeled) Transfers: Sit to/from Stand Sit to Stand: Min assist         General transfer comment: assist to rise and stabilize,   Also assisted on off toilet.    Ambulation/Gait Ambulation/Gait assistance: Min guard;Min assist Gait Distance (Feet): 75 Feet Assistive device: Rolling walker (2  wheeled) Gait Pattern/deviations: Step-through pattern;Decreased stride length;Trunk flexed     General Gait Details: tolerated an increased distance.  Slow but steady gait.     Stairs             Wheelchair Mobility    Modified Rankin (Stroke Patients Only)       Balance                                            Cognition Arousal/Alertness: Awake/alert Behavior During Therapy: WFL for tasks assessed/performed Overall Cognitive Status: Within Functional Limits for tasks assessed                                        Exercises      General Comments        Pertinent Vitals/Pain Pain Assessment: 0-10 Pain Score: 8  Pain Location: right  side with activity Pain Descriptors / Indicators: Discomfort;Cramping;Tender Pain Intervention(s): Monitored during session;Repositioned;Patient requesting pain meds-RN notified    Home Living                      Prior Function            PT Goals (current goals can now be found in the care plan section)  Progress towards PT goals: Progressing toward goals    Frequency    Min 3X/week      PT Plan Current plan remains appropriate    Co-evaluation              AM-PAC PT "6 Clicks" Mobility   Outcome Measure  Help needed turning from your back to your side while in a flat bed without using bedrails?: A Little Help needed moving from lying on your back to sitting on the side of a flat bed without using bedrails?: A Little Help needed moving to and from a bed to a chair (including a wheelchair)?: A Little Help needed standing up from a chair using your arms (e.g., wheelchair or bedside chair)?: A Little Help needed to walk in hospital room?: A Little Help needed climbing 3-5 steps with a railing? : A Little 6 Click Score: 18    End of Session Equipment Utilized During Treatment: Gait belt Activity Tolerance: Patient tolerated treatment well Patient left: in  bed;with call bell/phone within reach Nurse Communication: Mobility status;Patient requests pain meds PT Visit Diagnosis: Unsteadiness on feet (R26.81);Difficulty in walking, not elsewhere classified (R26.2)     Time: 1505-6979 PT Time Calculation (min) (ACUTE ONLY): 29 min  Charges:  $Gait Training: 8-22 mins $Therapeutic Activity: 8-22 mins                     Rica Koyanagi  PTA Acute  Rehabilitation Services Pager      [phone] Office      [phone]

## 2019-02-25 ENCOUNTER — Inpatient Hospital Stay (HOSPITAL_COMMUNITY): Payer: PPO

## 2019-02-25 ENCOUNTER — Telehealth: Payer: Self-pay | Admitting: Hematology

## 2019-02-25 LAB — CBC
HCT: 37.5 % (ref 36.0–46.0)
Hemoglobin: 11.2 g/dL — ABNORMAL LOW (ref 12.0–15.0)
MCH: 23.6 pg — ABNORMAL LOW (ref 26.0–34.0)
MCHC: 29.9 g/dL — ABNORMAL LOW (ref 30.0–36.0)
MCV: 79.1 fL — ABNORMAL LOW (ref 80.0–100.0)
Platelets: 512 10*3/uL — ABNORMAL HIGH (ref 150–400)
RBC: 4.74 MIL/uL (ref 3.87–5.11)
RDW: 15.6 % — AB (ref 11.5–15.5)
WBC: 29.5 10*3/uL — ABNORMAL HIGH (ref 4.0–10.5)
nRBC: 0 % (ref 0.0–0.2)

## 2019-02-25 LAB — BASIC METABOLIC PANEL
Anion gap: 10 (ref 5–15)
BUN: 7 mg/dL — AB (ref 8–23)
CO2: 23 mmol/L (ref 22–32)
Calcium: 8 mg/dL — ABNORMAL LOW (ref 8.9–10.3)
Chloride: 96 mmol/L — ABNORMAL LOW (ref 98–111)
Creatinine, Ser: 0.55 mg/dL (ref 0.44–1.00)
GFR calc Af Amer: >60 mL/min (ref >60)
GFR calc non Af Amer: >60 mL/min (ref >60)
Glucose, Bld: 157 mg/dL — ABNORMAL HIGH (ref 70–99)
Potassium: 4.2 mmol/L (ref 3.5–5.1)
Sodium: 129 mmol/L — ABNORMAL LOW (ref 135–145)

## 2019-02-25 MED ORDER — SODIUM CHLORIDE (PF) 0.9 % IJ SOLN
INTRAMUSCULAR | Status: AC
  Filled 2019-02-25: qty 50

## 2019-02-25 MED ORDER — SODIUM CHLORIDE 1 G PO TABS
2.0000 g | ORAL_TABLET | Freq: Once | ORAL | Status: AC
  Administered 2019-02-25: 2 g via ORAL
  Filled 2019-02-25: qty 2

## 2019-02-25 MED ORDER — IBUPROFEN 200 MG PO TABS: 600.0000 mg | ORAL_TABLET | Freq: Four times a day (QID) | ORAL | Status: DC | PRN

## 2019-02-25 MED ORDER — IOHEXOL 300 MG/ML  SOLN
100.0000 mL | Freq: Once | INTRAMUSCULAR | Status: AC | PRN
  Administered 2019-02-25: 100 mL via INTRAVENOUS

## 2019-02-25 MED ORDER — DAKINS (1/4 STRENGTH) 0.125 % EX SOLN
Freq: Three times a day (TID) | CUTANEOUS | Status: DC
  Administered 2019-02-25 – 2019-02-27 (×8)
  Filled 2019-02-25: qty 473

## 2019-02-25 MED ORDER — IOHEXOL 300 MG/ML  SOLN
15.0000 mL | Freq: Once | INTRAMUSCULAR | Status: DC | PRN
  Administered 2019-02-25: 30 mL via INTRAVENOUS
  Filled 2019-02-25: qty 20

## 2019-02-25 MED ORDER — OXYCODONE HCL 5 MG PO TABS
5.0000 mg | ORAL_TABLET | ORAL | Status: DC | PRN
  Administered 2019-02-25 (×2): 5 mg via ORAL
  Administered 2019-02-26 (×2): 10 mg via ORAL
  Administered 2019-02-27 – 2019-03-05 (×11): 5 mg via ORAL
  Filled 2019-02-25: qty 1
  Filled 2019-02-25: qty 2
  Filled 2019-02-25 (×12): qty 1
  Filled 2019-02-25: qty 2

## 2019-02-25 MED ORDER — ACETAMINOPHEN 325 MG PO TABS
650.0000 mg | ORAL_TABLET | Freq: Four times a day (QID) | ORAL | Status: DC | PRN
  Administered 2019-03-02: 650 mg via ORAL
  Filled 2019-02-25: qty 2

## 2019-02-25 NOTE — Telephone Encounter (Signed)
Marie Levy new patient appt has been rescheduled for her to see Dr. Burr Medico to 4/9 at 2:30pm. Pt is currently in the hospital.

## 2019-02-25 NOTE — Plan of Care (Signed)
Plan of care reviewed and discussed with the patient. 

## 2019-02-25 NOTE — Progress Notes (Signed)
PT Cancellation Note  Patient Details Name: Marie Levy MRN: [id number] DOB: [birth year]   Cancelled Treatment:     pt present with increased nausea today.  This afternoon having an ABD CT.  Will attempt to see tomorrow if possible.   Rica Koyanagi  PTA Acute  Rehabilitation Services Pager      [phone] Office      [phone]

## 2019-02-25 NOTE — Progress Notes (Signed)
[facility] Progress Note  5 Days Post-Op  Subjective: CC: nausea Patient complaining of more nausea today. No gas/stool yet. Ambulated yesterday. No more fevers since yesterday AM. Denies urinary or respiratory symptoms.   Objective: Vital signs in last 24 hours: Temp:  [98.2 F (36.8 C)-99.1 F (37.3 C)] 98.3 F (36.8 C) (03/24 0519) Pulse Rate:  [87-100] 91 (03/24 0519) Resp:  [8-18] 18 (03/24 0519) BP: (99-108)/(67-76) 99/68 (03/24 0519) SpO2:  [92 %-98 %] 95 % (03/24 0519) Last BM Date: 02/20/19  Intake/Output from previous day: 03/23 0701 - 03/24 0700 In: 2968.9 [P.O.:540; I.V.:2328.9; IV Piggyback:100] Out: 2190 [Urine:2050; Drains:40; Stool:100] Intake/Output this shift: No intake/output data recorded.  PE: Gen:  Alert, NAD, pleasant Card:  Regular rate and rhythm Pulm:  Normal effort Abd: Soft, generalized ttp, non-distended, BS hypoactive, midline incision with some slough in base with tan drainage today, stoma pink with SS fluid in ostomy bag, drain with SS looking fluid  Skin: warm and dry, no rashes  Psych: A&Ox3   Lab Results:  Recent Labs    02/24/19 0830 02/25/19 0459  WBC 23.5* 29.5*  HGB 9.7* 11.2*  HCT 32.6* 37.5  PLT 464* 512*   BMET Recent Labs    02/24/19 0830 02/25/19 0459  NA 129* 129*  K 4.1 4.2  CL 96* 96*  CO2 26 23  GLUCOSE 123* 157*  BUN <5* 7*  CREATININE 0.54 0.55  CALCIUM 7.7* 8.0*   PT/INR No results for input(s): LABPROT, INR in the last 72 hours. CMP     Component Value Date/Time   NA 129 (L) 02/25/2019 0459   K 4.2 02/25/2019 0459   CL 96 (L) 02/25/2019 0459   CO2 23 02/25/2019 0459   GLUCOSE 157 (H) 02/25/2019 0459   BUN 7 (L) 02/25/2019 0459   CREATININE 0.55 02/25/2019 0459   CALCIUM 8.0 (L) 02/25/2019 0459   PROT 4.9 (L) 02/24/2019 0830   ALBUMIN 1.7 (L) 02/24/2019 0830   AST 14 (L) 02/24/2019 0830   ALT 12 02/24/2019 0830   ALKPHOS 71 02/24/2019 0830   BILITOT 0.5 02/24/2019 0830   GFRNONAA >60 02/25/2019 0459   GFRAA >60 02/25/2019 0459   Lipase     Component Value Date/Time   LIPASE 24 02/20/2019 1820       Studies/Results: No results found.  Anti-infectives: Anti-infectives (From admission, onward)   Start     Dose/Rate Route Frequency Ordered Stop   02/20/19 1945  piperacillin-tazobactam (ZOSYN) IVPB 2.25 g  Status:  Discontinued     2.25 g 100 mL/hr over 30 Minutes Intravenous Every 6 hours 02/20/19 1935 02/20/19 1941   02/20/19 1945  piperacillin-tazobactam (ZOSYN) IVPB 3.375 g     3.375 g 100 mL/hr over 30 Minutes Intravenous  Once 02/20/19 1942 02/20/19 2042       Assessment/Plan Perforated viscus  POD5, s/p LAR with new end colostomy for perforated rectal cancer -TID WD dressing changes - Dakins x3 days -WOC following -routine JP drain care -mobilize!!! and pulm toilet -PT recommending HH PT -repeat labs in am - WBC up to 29, CT abd/pelvis today   Hypotension-improved  FEN -sips and ice chips VTE - SQheparin ID -zosyn 3/19 >>  LOS: 5 days    Brigid Re , [facility] Surgery 02/25/2019, 8:22 AM Pager: [phone]

## 2019-02-25 NOTE — Consult Note (Signed)
[place] Nurse ostomy follow up Stoma type/location: LUQ colostomy Stomal assessment/size: 1 3/8" edematous pink and moist.  No stool in pouch today.  Peristomal assessment: Intact.  Skin fold proximal to stoma using 1piece flat pouch with barrier ring for maximum flexibility.  Treatment options for stomal/peristomal skin: barrier ring and flexible pouch.  Output none Ostomy pouching: 1pc.flat with barrier ring.  Education provided:  Pouch change performed.  Patient is nauseated but observes and asks questions.  Discussed twice weekly pouch changes and emptying when 1/3 full.  SHowering and activity level are discussed.  She is minimally participative today  Enrolled patient in [place] Discharge program: No WOC team will follow.  Domenic Moras MSN, RN, FNP-BC CWON Wound, Ostomy, Continence Nurse Pager [phone]

## 2019-02-26 ENCOUNTER — Telehealth: Payer: Self-pay | Admitting: Hematology

## 2019-02-26 ENCOUNTER — Ambulatory Visit: Payer: PPO | Admitting: Hematology

## 2019-02-26 LAB — CBC
HCT: 32.1 % — ABNORMAL LOW (ref 36.0–46.0)
HEMOGLOBIN: 9.7 g/dL — AB (ref 12.0–15.0)
MCH: 23.8 pg — ABNORMAL LOW (ref 26.0–34.0)
MCHC: 30.2 g/dL (ref 30.0–36.0)
MCV: 78.9 fL — ABNORMAL LOW (ref 80.0–100.0)
Platelets: 481 10*3/uL — ABNORMAL HIGH (ref 150–400)
RBC: 4.07 MIL/uL (ref 3.87–5.11)
RDW: 15.6 % — ABNORMAL HIGH (ref 11.5–15.5)
WBC: 22.3 10*3/uL — ABNORMAL HIGH (ref 4.0–10.5)
nRBC: 0 % (ref 0.0–0.2)

## 2019-02-26 LAB — BASIC METABOLIC PANEL
Anion gap: 7 (ref 5–15)
BUN: 7 mg/dL — ABNORMAL LOW (ref 8–23)
CO2: 25 mmol/L (ref 22–32)
Calcium: 7.8 mg/dL — ABNORMAL LOW (ref 8.9–10.3)
Chloride: 95 mmol/L — ABNORMAL LOW (ref 98–111)
Creatinine, Ser: 0.59 mg/dL (ref 0.44–1.00)
GFR calc Af Amer: >60 mL/min (ref >60)
GFR calc non Af Amer: >60 mL/min (ref >60)
Glucose, Bld: 126 mg/dL — ABNORMAL HIGH (ref 70–99)
Potassium: 4.8 mmol/L (ref 3.5–5.1)
SODIUM: 127 mmol/L — AB (ref 135–145)

## 2019-02-26 MED ORDER — IBUPROFEN 200 MG PO TABS: 600.0000 mg | ORAL_TABLET | Freq: Four times a day (QID) | ORAL | Status: DC | PRN

## 2019-02-26 MED ORDER — KCL IN DEXTROSE-NACL 20-5-0.9 MEQ/L-%-% IV SOLN
INTRAVENOUS | Status: DC
  Administered 2019-02-26 (×2): via INTRAVENOUS
  Filled 2019-02-26 (×3): qty 1000

## 2019-02-26 MED ORDER — PIPERACILLIN-TAZOBACTAM 3.375 G IVPB
3.3750 g | Freq: Three times a day (TID) | INTRAVENOUS | Status: DC
  Administered 2019-02-26 – 2019-03-05 (×22): 3.375 g via INTRAVENOUS
  Filled 2019-02-26 (×22): qty 50

## 2019-02-26 MED ORDER — KETOROLAC TROMETHAMINE 30 MG/ML IJ SOLN
30.0000 mg | Freq: Three times a day (TID) | INTRAMUSCULAR | Status: AC
  Administered 2019-02-26 – 2019-02-28 (×9): 30 mg via INTRAVENOUS
  Filled 2019-02-26 (×9): qty 1

## 2019-02-26 MED ORDER — SODIUM CHLORIDE 0.9 % IV SOLN
INTRAVENOUS | Status: DC | PRN
  Administered 2019-02-26: 250 mL via INTRAVENOUS
  Administered 2019-03-04: 475 mL via INTRAVENOUS

## 2019-02-26 NOTE — Progress Notes (Signed)
Physical Therapy Treatment Patient Details Name: Marie Levy MRN: [id number] DOB: January 31, 1946 Today's Date: 02/26/2019    History of Present Illness 73 y.o. female with hx of hypothyroidism whom presented to the ED 02/20/19 with complaints of abdominal pain,  recently referred to our office as a referral by Dr. Benson Norway for newly diagnosed rectal cancer - near obstructing; s/p expl lap, lower anterior resection and end colostomy on 02/21/19    PT Comments    Pt progressing with her mobility.  "Touch and go" nausea today.     Follow Up Recommendations  Home health PT;Supervision - Intermittent     Equipment Recommendations       Recommendations for Other Services       Precautions / Restrictions      Mobility  Bed Mobility Overal bed mobility: Needs Assistance Bed Mobility: Supine to Sit     Supine to sit: Mod assist     General bed mobility comments: assist with upper from supine to EOB with increased effort  Transfers Overall transfer level: Needs assistance Equipment used: Rolling walker (2 wheeled) Transfers: Sit to/from Stand Sit to Stand: Min assist         General transfer comment: assist to rise and stabilize,   Also assisted on off toilet.    Ambulation/Gait Ambulation/Gait assistance: Min guard;Min assist Gait Distance (Feet): 110 Feet Assistive device: Rolling walker (2 wheeled) Gait Pattern/deviations: Step-through pattern;Decreased stride length;Trunk flexed     General Gait Details: tolerated an increased distance however decreased speed and shorter steps steps.  Touch and go nausea.     Stairs             Wheelchair Mobility    Modified Rankin (Stroke Patients Only)       Balance                                            Cognition Arousal/Alertness: Awake/alert Behavior During Therapy: WFL for tasks assessed/performed Overall Cognitive Status: Within Functional Limits for tasks assessed                                         Exercises      General Comments        Pertinent Vitals/Pain Pain Assessment: Faces Pain Location: right  side with activity Pain Descriptors / Indicators: Discomfort;Cramping;Tender Pain Intervention(s): Monitored during session;Repositioned    Home Living                      Prior Function            PT Goals (current goals can now be found in the care plan section) Progress towards PT goals: Progressing toward goals    Frequency    Min 3X/week      PT Plan Current plan remains appropriate    Co-evaluation              AM-PAC PT "6 Clicks" Mobility   Outcome Measure  Help needed turning from your back to your side while in a flat bed without using bedrails?: A Little Help needed moving from lying on your back to sitting on the side of a flat bed without using bedrails?: A Little Help needed moving to and from a bed to a  chair (including a wheelchair)?: A Little Help needed standing up from a chair using your arms (e.g., wheelchair or bedside chair)?: A Little Help needed to walk in hospital room?: A Little Help needed climbing 3-5 steps with a railing? : A Little 6 Click Score: 18    End of Session Equipment Utilized During Treatment: Gait belt Activity Tolerance: Patient tolerated treatment well Patient left: in chair Nurse Communication: Mobility status;Patient requests pain meds PT Visit Diagnosis: Unsteadiness on feet (R26.81);Difficulty in walking, not elsewhere classified (R26.2)     Time: 4944-9675 PT Time Calculation (min) (ACUTE ONLY): 31 min  Charges:  $Gait Training: 8-22 mins $Therapeutic Activity: 8-22 mins                     Rica Koyanagi  PTA Acute  Rehabilitation Services Pager      [phone] Office      [phone]

## 2019-02-26 NOTE — Progress Notes (Addendum)
6 Days Post-Op    CC: Abdominal pain with recent sigmoidoscopy/mass found  Subjective: Patient sitting up in bed.  Having fair amount of pain.  Midline incision is necrotic at the base.  Near the top of the incision.  The remainder of the wound is pink and looks good.  She is getting wet-to-dry with Dakin's 3 times daily.  There is no stool in the ostomy.  But there is some gas this morning.  Objective: Vital signs in last 24 hours: Temp:  [98.8 F (37.1 C)-99 F (37.2 C)] 98.8 F (37.1 C) (03/25 0324) Pulse Rate:  [85-105] 85 (03/25 0324) Resp:  [16-18] 18 (03/25 0324) BP: (106-127)/(75-76) 127/75 (03/25 0324) SpO2:  [92 %-96 %] 92 % (03/25 0324) Last BM Date: 02/20/19 450 p.o. 3076 IV 450 urine 103 drain Nothing recorded from the colostomy Afebrile vital signs are stable Labs show sodium 127, potassium of 4.8, creatinine 0.59, calcium 7.8, WBC down to 22.3 H/H9 0.7/32.1, platelets 481,000. CT yesterday showed postop changes for low anterior resection with left lower quadrant colostomy, postop ileus no evidence of abscess or bowel obstruction, increased diffuse body wall and mesenteric edema and small bilateral pleural effusions consistent with third spacing. Intake/Output from previous day: 03/24 0701 - 03/25 0700 In: 3627 [P.O.:450; I.V.:3076.2; IV Piggyback:100.8] Out: 1353 [Urine:1250; Drains:103] Intake/Output this shift: Total I/O In: 0  Out: 200 [Urine:200]  General appearance: alert, cooperative and no distress Resp: clear to auscultation bilaterally GI: Soft, fairly tender, midline incision has some dark necrotic tissue at the base to the top of the incision.  The sidewalls look good.  The ostomy is pink and edematous.  There is gas in the ostomy bag this a.m.  Lab Results:  Recent Labs    02/25/19 0459 02/26/19 0321  WBC 29.5* 22.3*  HGB 11.2* 9.7*  HCT 37.5 32.1*  PLT 512* 481*    BMET Recent Labs    02/25/19 0459 02/26/19 0321  NA 129* 127*  K  4.2 4.8  CL 96* 95*  CO2 23 25  GLUCOSE 157* 126*  BUN 7* 7*  CREATININE 0.55 0.59  CALCIUM 8.0* 7.8*   PT/INR No results for input(s): LABPROT, INR in the last 72 hours.  Recent Labs  Lab 02/20/19 1820 02/24/19 0830  AST 21 14*  ALT 13 12  ALKPHOS 84 71  BILITOT 1.4* 0.5  PROT 6.1* 4.9*  ALBUMIN 2.6* 1.7*     Lipase     Component Value Date/Time   LIPASE 24 02/20/2019 1820  Diagnosis Colon, segmental resection for tumor, recto-sigmoid colon - INVASIVE ADENOCARCINOMA, MODERATELY DIFFERENTIATED, SPANNING 4 CM. - TUMOR INVOLVES PERICOLONIC SOFT TISSUE. - DIVERTICULITIS WITH ABSCESS. - SEROSITIS. - DISTAL RESECTION MARGIN IS POSITIVE FOR CARCINOMA. - FIFTEEN OF FIFTEEN LYMPH NODES NEGATIVE FOR CARCINOMA (0/15).   Medications: . chlorhexidine  15 mL Mouth Rinse BID  . heparin  5,000 Units Subcutaneous Q8H  . mouth rinse  15 mL Mouth Rinse q12n4p  . sodium hypochlorite   Irrigation TID  . thyroid  90 mg Oral Daily   . dextrose 5 % and 0.45 % NaCl with KCl 20 mEq/L 125 mL/hr at 02/26/19 0902  . methocarbamol (ROBAXIN) IV 500 mg (02/26/19 0602)   Anti-infectives (From admission, onward)   Start     Dose/Rate Route Frequency Ordered Stop   02/20/19 1945  piperacillin-tazobactam (ZOSYN) IVPB 2.25 g  Status:  Discontinued     2.25 g 100 mL/hr over 30 Minutes Intravenous Every 6 hours  02/20/19 1935 02/20/19 1941   02/20/19 1945  piperacillin-tazobactam (ZOSYN) IVPB 3.375 g     3.375 g 100 mL/hr over 30 Minutes Intravenous  Once 02/20/19 1942 02/20/19 2042     . dextrose 5 % and 0.45 % NaCl with KCl 20 mEq/L 125 mL/hr at 02/26/19 0902  . methocarbamol (ROBAXIN) IV 500 mg (02/26/19 0602)    Assessment/Plan Perforated viscus Hyponatremia - decrease fluids, change to D5NS+20KCL Leukocytosis - restart Zosyn  POD5, s/p LAR with new end colostomy for perforated rectal adenocarcinoma  -TID WD dressing changes - Dakins x3 days -WOCfollowing -routine JP drain  care -mobilize!!!and pulm toilet -PT recommending HH PT  - add Toradol to pain control medicines  Hypotension-improved  FEN -sips and ice chips VTE -SQheparin ID -zosyn 3/19 x 1; restart 3/25   Plan: We will change her IV fluids to normal saline for her hyponatremia.  Restart Zosyn.  Add Toradol for pain.  Continue to work on mobilizing.  Recheck labs in a.m. including pre-albumin.  LOS: 6 days    Keats Kingry 02/26/2019 [phone]

## 2019-02-26 NOTE — Telephone Encounter (Signed)
Received a call from the pt and her caregiver to reschedule appt to 4/23 at 145pm with Dr. Burr Medico.

## 2019-02-27 ENCOUNTER — Ambulatory Visit: Payer: PPO | Admitting: Radiation Oncology

## 2019-02-27 ENCOUNTER — Ambulatory Visit: Payer: PPO

## 2019-02-27 LAB — CBC
HCT: 30.1 % — ABNORMAL LOW (ref 36.0–46.0)
Hemoglobin: 8.9 g/dL — ABNORMAL LOW (ref 12.0–15.0)
MCH: 23.7 pg — ABNORMAL LOW (ref 26.0–34.0)
MCHC: 29.6 g/dL — ABNORMAL LOW (ref 30.0–36.0)
MCV: 80.1 fL (ref 80.0–100.0)
NRBC: 0 % (ref 0.0–0.2)
PLATELETS: 491 10*3/uL — AB (ref 150–400)
RBC: 3.76 MIL/uL — ABNORMAL LOW (ref 3.87–5.11)
RDW: 15.7 % — ABNORMAL HIGH (ref 11.5–15.5)
WBC: 23.9 10*3/uL — ABNORMAL HIGH (ref 4.0–10.5)

## 2019-02-27 LAB — BASIC METABOLIC PANEL
Anion gap: 6 (ref 5–15)
BUN: 7 mg/dL — ABNORMAL LOW (ref 8–23)
CALCIUM: 7.8 mg/dL — AB (ref 8.9–10.3)
CO2: 24 mmol/L (ref 22–32)
Chloride: 99 mmol/L (ref 98–111)
Creatinine, Ser: 0.73 mg/dL (ref 0.44–1.00)
GFR calc Af Amer: >60 mL/min (ref >60)
Glucose, Bld: 110 mg/dL — ABNORMAL HIGH (ref 70–99)
Potassium: 5.1 mmol/L (ref 3.5–5.1)
Sodium: 129 mmol/L — ABNORMAL LOW (ref 135–145)

## 2019-02-27 LAB — PREALBUMIN: Prealbumin: <5 mg/dL — ABNORMAL LOW (ref 18–38)

## 2019-02-27 MED ORDER — UNJURY CHICKEN SOUP POWDER
8.0000 [oz_av] | Freq: Three times a day (TID) | ORAL | Status: DC
  Administered 2019-02-27 – 2019-03-01 (×3): 8 [oz_av] via ORAL

## 2019-02-27 MED ORDER — PRO-STAT SUGAR FREE PO LIQD
30.0000 mL | Freq: Two times a day (BID) | ORAL | Status: DC
  Administered 2019-02-27: 30 mL via ORAL
  Filled 2019-02-27: qty 30

## 2019-02-27 MED ORDER — ENSURE SURGERY PO LIQD
237.0000 mL | Freq: Two times a day (BID) | ORAL | Status: DC
  Administered 2019-02-27 – 2019-03-04 (×6): 237 mL via ORAL
  Filled 2019-02-27 (×14): qty 237

## 2019-02-27 MED ORDER — METHOCARBAMOL 500 MG PO TABS
500.0000 mg | ORAL_TABLET | Freq: Three times a day (TID) | ORAL | Status: DC
  Administered 2019-02-27 (×2): 500 mg via ORAL
  Filled 2019-02-27 (×2): qty 1

## 2019-02-27 MED ORDER — SODIUM CHLORIDE 1 G PO TABS
1.0000 g | ORAL_TABLET | Freq: Once | ORAL | Status: AC
  Administered 2019-02-27: 1 g via ORAL
  Filled 2019-02-27: qty 1

## 2019-02-27 NOTE — Progress Notes (Addendum)
[facility] Progress Note  7 Days Post-Op  Subjective: CC: dry mouth and oral pain Patient complaining of dry mouth and oral pain this AM. Complains that she can not tolerate cold liquids and had this issue even prior to surgery. Patient still having some abdominal pain. Nausea improving. Patient lives at home alone and we discussed wound care needs today and working to empty her colostomy on her own today.   Objective: Vital signs in last 24 hours: Temp:  [97.7 F (36.5 C)-98 F (36.7 C)] 98 F (36.7 C) (03/26 0617) Pulse Rate:  [92-99] 92 (03/26 0617) Resp:  [14-16] 16 (03/26 0617) BP: (92-121)/(64-82) 121/82 (03/26 0617) SpO2:  [96 %-98 %] 98 % (03/26 0617) Last BM Date: 02/20/19  Intake/Output from previous day: 03/25 0701 - 03/26 0700 In: 3065.9 [P.O.:150; I.V.:2691.9; IV Piggyback:224] Out: 1765 [Urine:1550; Drains:15; STMHD:622] Intake/Output this shift: No intake/output data recorded.  PE: Gen: Alert, NAD, pleasant Card: Regular rate and rhythm Pulm: Normal effort Abd: Soft,generalizedttp, non-distended,+BS, midline incision with some slough in base, stoma pink with stool in ostomy bag, drain with lacteal fluid Skin: warm and dry, no rashes  Psych: A&Ox3   Lab Results:  Recent Labs    02/26/19 0321 02/27/19 0322  WBC 22.3* 23.9*  HGB 9.7* 8.9*  HCT 32.1* 30.1*  PLT 481* 491*   BMET Recent Labs    02/26/19 0321 02/27/19 0322  NA 127* 129*  K 4.8 5.1  CL 95* 99  CO2 25 24  GLUCOSE 126* 110*  BUN 7* 7*  CREATININE 0.59 0.73  CALCIUM 7.8* 7.8*   PT/INR No results for input(s): LABPROT, INR in the last 72 hours. CMP     Component Value Date/Time   NA 129 (L) 02/27/2019 0322   K 5.1 02/27/2019 0322   CL 99 02/27/2019 0322   CO2 24 02/27/2019 0322   GLUCOSE 110 (H) 02/27/2019 0322   BUN 7 (L) 02/27/2019 0322   CREATININE 0.73 02/27/2019 0322   CALCIUM 7.8 (L) 02/27/2019 0322   PROT 4.9 (L) 02/24/2019 0830   ALBUMIN 1.7 (L)  02/24/2019 0830   AST 14 (L) 02/24/2019 0830   ALT 12 02/24/2019 0830   ALKPHOS 71 02/24/2019 0830   BILITOT 0.5 02/24/2019 0830   GFRNONAA >60 02/27/2019 0322   GFRAA >60 02/27/2019 0322   Lipase     Component Value Date/Time   LIPASE 24 02/20/2019 1820       Studies/Results: Ct Abdomen Pelvis W Contrast  Result Date: 02/25/2019 CLINICAL DATA:  Abdominal pain and fever. Perforated rectal carcinoma. 5 days status post low anterior resection with colostomy. EXAM: CT ABDOMEN AND PELVIS WITH CONTRAST TECHNIQUE: Multidetector CT imaging of the abdomen and pelvis was performed using the standard protocol following bolus administration of intravenous contrast. CONTRAST:  134mL OMNIPAQUE IOHEXOL 300 MG/ML SOLN, 62mL OMNIPAQUE IOHEXOL 300 MG/ML SOLN COMPARISON:  02/20/2019 FINDINGS: Lower Chest: Increased dependent bibasilar atelectasis and new small bilateral pleural effusions. Hepatobiliary: No hepatic masses identified. Several small hepatic cysts remain stable. Gallbladder is distended but otherwise unremarkable in appearance. No evidence of biliary ductal dilatation. Pancreas:  No mass or inflammatory changes. Spleen: Within normal limits in size and appearance. Adrenals/Urinary Tract: No masses identified. No evidence of hydronephrosis. Small amount of gas in the urinary bladder is most likely due to recent instrumentation. Stomach/Bowel: Postop changes are seen from low anterior resection with left lower quadrant colostomy since prior exam. Surgical drain is seen within the pelvic cul-de-sac. Generalized gaseous  distention of small bowel and colon is consistent with postop ileus. No focal inflammatory process abscess identified. Vascular/Lymphatic: No pathologically enlarged lymph nodes. No abdominal aortic aneurysm. Reproductive:  No mass or other significant abnormality. Other:  Increased diffuse body wall and mesenteric edema. Musculoskeletal:  No suspicious bone lesions identified. IMPRESSION:  1. Postop changes from low anterior resection with left lower quadrant colostomy. Postop ileus. No evidence of abscess or bowel obstruction. 2. 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 Electronically Signed   By: Earle Gell M.D.   On: 02/25/2019 20:07    Anti-infectives: Anti-infectives (From admission, onward)   Start     Dose/Rate Route Frequency Ordered Stop   02/26/19 1200  piperacillin-tazobactam (ZOSYN) IVPB 3.375 g     3.375 g 12.5 mL/hr over 240 Minutes Intravenous Every 8 hours 02/26/19 0922     02/20/19 1945  piperacillin-tazobactam (ZOSYN) IVPB 2.25 g  Status:  Discontinued     2.25 g 100 mL/hr over 30 Minutes Intravenous Every 6 hours 02/20/19 1935 02/20/19 1941   02/20/19 1945  piperacillin-tazobactam (ZOSYN) IVPB 3.375 g     3.375 g 100 mL/hr over 30 Minutes Intravenous  Once 02/20/19 1942 02/20/19 2042       Assessment/Plan Perforated viscus Hyponatremia - Na 129, stop fluids, give NaCl tab today  Leukocytosis - WBC 23.9, patient afeb, zosyn restarted 3/25  POD7, s/p LAR with new end colostomy for perforated rectal adenocarcinoma  - TID WD dressing changes- Dakins x3 days - WOCfollowing - JP with 15 cc out - will discuss removal with MD - mobilize!!!and pulm toilet - PTrecommending HH PT - having stool output, advance to FLD - path report - invasive adenocarcinoma with positive distal margin (T3N0)  Severe protein calorie malnutrition - prealbumin <5, ordered ensure and prostat, nutrition consult   FEN -FLD VTE -SQheparin ID -zosyn 3/19; Zosyn 3/25>> Follow up: Dr. [name]   Plan: nutritional supplements and advance to FLD. Continue TID dressing changes. Repeat labs in AM.   LOS: 7 days    Brigid Re , [facility] 02/27/2019, 8:48 AM Pager: [phone] Consults: [phone]

## 2019-02-27 NOTE — Consult Note (Signed)
[place] Nurse ostomy follow up Stoma type/location: LUQ colostomy.  Intact.  Has soft brown stool in pouch. Refuses pouch change today as "no one will let me sleep!!".  Will attempt ongoing education tomorrow  [place] team will follow.  Domenic Moras MSN, RN, FNP-BC CWON Wound, Ostomy, Continence Nurse Pager [phone]

## 2019-02-27 NOTE — Progress Notes (Signed)
Physical Therapy Treatment Patient Details Name: Marie Levy MRN: [id number] DOB: Sep 01, 1946 Today's Date: 02/27/2019    History of Present Illness 73 y.o. female with hx of hypothyroidism whom presented to the ED 02/20/19 with complaints of abdominal pain,  recently referred to our office as a referral by Dr. Benson Norway for newly diagnosed rectal cancer - near obstructing; s/p expl lap, lower anterior resection and end colostomy on 02/21/19    PT Comments    Assisted to bathroom then amb in hallway.  Pt required increased time.  Moving slowly.  Feeling "touch and go" nausea.  Feeling exhausted from poor sleep and feeling "weak".  Assisted back to bed and positioned to comfort.    Follow Up Recommendations  Home health PT;Supervision - Intermittent Pt lives home alone, will need some assist with dailey activities, dressing changes ect.     Equipment Recommendations  Rolling walker with 5" wheels    Recommendations for Other Services       Precautions / Restrictions Precautions Precautions: Fall Precaution Comments: recent ABD surgery, colostomy, R LQ drain Restrictions Weight Bearing Restrictions: No    Mobility  Bed Mobility Overal bed mobility: Needs Assistance         Sit to supine: Mod assist   General bed mobility comments: Assisted B LE up onto bed  Transfers Overall transfer level: Needs assistance Equipment used: Rolling walker (2 wheeled) Transfers: Sit to/from [employer] Sit to Stand: Supervision;Min guard Stand pivot transfers: Supervision;Min guard       General transfer comment: assisted with toilet transfer and back to bed.    Ambulation/Gait Ambulation/Gait assistance: Min guard;Min assist Gait Distance (Feet): 125 Feet Assistive device: Rolling walker (2 wheeled) Gait Pattern/deviations: Step-through pattern;Decreased stride length;Trunk flexed     General Gait Details: tolerated an increased distance however decreased speed and  shorter steps steps.     Stairs             Wheelchair Mobility    Modified Rankin (Stroke Patients Only)       Balance                                            Cognition Arousal/Alertness: Awake/alert Behavior During Therapy: WFL for tasks assessed/performed Overall Cognitive Status: Within Functional Limits for tasks assessed                                        Exercises      General Comments        Pertinent Vitals/Pain Pain Assessment: Faces Faces Pain Scale: Hurts a little bit Pain Location: right  side with activity Pain Descriptors / Indicators: Discomfort;Cramping;Tender Pain Intervention(s): Monitored during session;Repositioned    Home Living                      Prior Function            PT Goals (current goals can now be found in the care plan section) Progress towards PT goals: Progressing toward goals    Frequency    Min 3X/week      PT Plan Current plan remains appropriate    Co-evaluation              AM-PAC PT "6 Clicks" Mobility  Outcome Measure  Help needed turning from your back to your side while in a flat bed without using bedrails?: A Little Help needed moving from lying on your back to sitting on the side of a flat bed without using bedrails?: A Little   Help needed standing up from a chair using your arms (e.g., wheelchair or bedside chair)?: A Little Help needed to walk in hospital room?: A Little Help needed climbing 3-5 steps with a railing? : A Little 6 Click Score: 15    End of Session Equipment Utilized During Treatment: Gait belt Activity Tolerance: Patient tolerated treatment well Patient left: in bed;with bed alarm set;with call bell/phone within reach Nurse Communication: Mobility status PT Visit Diagnosis: Unsteadiness on feet (R26.81);Difficulty in walking, not elsewhere classified (R26.2)     Time: 5701-7793 PT Time Calculation (min) (ACUTE  ONLY): 28 min  Charges:  $Gait Training: 8-22 mins $Therapeutic Activity: 8-22 mins                     Rica Koyanagi  PTA Acute  Rehabilitation Services Pager      [phone] Office      [phone]

## 2019-02-27 NOTE — TOC Progression Note (Signed)
Transition of Care [facility]) - Progression Note    Patient Details  Name: JENTRI AYE MRN: [id number] Date of Birth: May 25, 1946  [facility] CM/SW Contact  Leeroy Cha, RN Phone Number: 02/27/2019, 10:53 AM  Clinical Narrative:    Arranging home health RN for wound care and PT    Expected Discharge Plan: [place] Barriers to Discharge: No Barriers Identified  Expected Discharge Plan and Services Expected Discharge Plan: [place]   Discharge Planning Services: CM Consult Post Acute Care Choice: [place] arrangements for the past 2 months: Single Family Home                     HH Arranged: RN, PT [facility] Agency: [place] ([facility]   Social Determinants of Health (SDOH) Interventions    Readmission Risk Interventions No flowsheet data found.

## 2019-02-27 NOTE — Progress Notes (Signed)
Initial Nutrition Assessment  INTERVENTION:   -Provide [place] chicken Soup TID with meals, Each serving provides 100kcal and 21g protein  -Continue Ensure Surgery BID, each provides 330 kcal and 18g protein -Patient requests gluten-free diet once able to have solid diet  NUTRITION DIAGNOSIS:   Increased nutrient needs related to post-op healing as evidenced by estimated needs.  GOAL:   Patient will meet greater than or equal to 90% of their needs  MONITOR:   PO intake, Supplement acceptance, Weight trends, Labs, I & O's  REASON FOR ASSESSMENT:   Consult Assessment of nutrition requirement/status, Wound healing, Diet education  ASSESSMENT:   73 y.o. female with hx of hypothyroidism whom presented to the ED with complaints of abdominal pain. Patient recently diagnosed rectal cancer - near obstructing.     **RD working remotely**  3/19: admitted, NPO, s/p Exploratory laparotomy, Low anterior resection, End colostomy 3/26: Diet advanced to [place]  Spoke with patient on the phone to gather nutrition history. Pt reports she is attempting to drink her supplements, Ensure Surgery and Prostat. States it is difficult to drink the Prostat d/t the taste and she is concerned about the sugar content of the Ensure supplement. Explained to the pt that the sugar content isn't typically a concern for someone who hasn't eaten in 5 days. Pt states understanding but expressed how particular she is about what she eats. States she is "gluten sensitive" and she is lactose intolerant. Suggested dairy free alternatives but was not interested in anything at this time. States she wishes she could drink her supplement that she buys at [employer] but is unable to have family bring it in d/t current visitor restrictions. Requests a gluten-free diet when diet advanced.  No weight history in chart. Will need to discuss weight history at follow-up.  Medications reviewed. Labs reviewed: Low Na   NUTRITION -  FOCUSED PHYSICAL EXAM:  Deferred given department requirement for RDs to work remotely.  Diet Order:   Diet Order            Diet full liquid Room service appropriate? Yes; Fluid consistency: Thin  Diet effective now              EDUCATION NEEDS:   Education needs have been addressed  Skin:  Skin Assessment: Reviewed RN Assessment  Last BM:  3/19  Height:   Ht Readings from Last 1 Encounters:  02/20/19 5\' 6"  (1.676 m)    Weight:   Wt Readings from Last 1 Encounters:  02/20/19 56.2 kg    Ideal Body Weight:  59.1 kg  BMI:  Body mass index is 20.01 kg/m.  Estimated Nutritional Needs:   Kcal:  1500-1700  Protein:  70-80g  Fluid:  1.7L/day  Clayton Bibles, MS, RD, LDN [place] Dietitian Pager: [phone] After Hours Pager: [phone]

## 2019-02-28 LAB — CBC
HEMATOCRIT: 27.6 % — AB (ref 36.0–46.0)
Hemoglobin: 8.4 g/dL — ABNORMAL LOW (ref 12.0–15.0)
MCH: 23.5 pg — ABNORMAL LOW (ref 26.0–34.0)
MCHC: 30.4 g/dL (ref 30.0–36.0)
MCV: 77.1 fL — ABNORMAL LOW (ref 80.0–100.0)
Platelets: 530 10*3/uL — ABNORMAL HIGH (ref 150–400)
RBC: 3.58 MIL/uL — AB (ref 3.87–5.11)
RDW: 15.5 % (ref 11.5–15.5)
WBC: 22.2 10*3/uL — ABNORMAL HIGH (ref 4.0–10.5)
nRBC: 0 % (ref 0.0–0.2)

## 2019-02-28 LAB — BASIC METABOLIC PANEL
Anion gap: 7 (ref 5–15)
BUN: 12 mg/dL (ref 8–23)
CHLORIDE: 99 mmol/L (ref 98–111)
CO2: 24 mmol/L (ref 22–32)
Calcium: 7.6 mg/dL — ABNORMAL LOW (ref 8.9–10.3)
Creatinine, Ser: 0.61 mg/dL (ref 0.44–1.00)
GFR calc Af Amer: >60 mL/min (ref >60)
GFR calc non Af Amer: >60 mL/min (ref >60)
Glucose, Bld: 100 mg/dL — ABNORMAL HIGH (ref 70–99)
POTASSIUM: 4.3 mmol/L (ref 3.5–5.1)
Sodium: 130 mmol/L — ABNORMAL LOW (ref 135–145)

## 2019-02-28 MED ORDER — DOCUSATE SODIUM 50 MG/5ML PO LIQD
100.0000 mg | Freq: Two times a day (BID) | ORAL | Status: DC
  Administered 2019-02-28 – 2019-03-02 (×5): 100 mg via ORAL
  Filled 2019-02-28 (×7): qty 10

## 2019-02-28 MED ORDER — METHOCARBAMOL 500 MG PO TABS
750.0000 mg | ORAL_TABLET | Freq: Three times a day (TID) | ORAL | Status: DC
  Administered 2019-02-28 – 2019-03-05 (×15): 750 mg via ORAL
  Filled 2019-02-28 (×15): qty 2

## 2019-02-28 MED ORDER — PROMETHAZINE HCL 25 MG/ML IJ SOLN
6.2500 mg | Freq: Four times a day (QID) | INTRAMUSCULAR | Status: DC | PRN
  Administered 2019-02-28: 6.25 mg via INTRAVENOUS
  Filled 2019-02-28 (×2): qty 1

## 2019-02-28 NOTE — Progress Notes (Signed)
At 1216, [facility] was paged regarding the pt's c/o nausea. Pt received IV Zofran at 0743, but they pt reported relief for a small period of time. The pt is currently refusing the scheduled protein supplement d/t the nausea.

## 2019-02-28 NOTE — Consult Note (Signed)
[place] Nurse ostomy follow up Stoma type/location: LUQ colostomy.  Agrees to pouch change at this time.   Stomal assessment/size: 1 3/8" pink and moist  Producing soft brown stool Peristomal assessment: intact.  Implemented 1 piece convex pouch.  Treatment options for stomal/peristomal skin: Barrier ring and convex pouch Output soft brown stool Ostomy pouching: 1pc.convex and barrier ring  Education provided: Patient removes old pouch with assistance and is able to cut out new pouch opening.  Barrier ring applied with assistance and new appliance is placed by patient with assist. Discussed emptying when  Enrolled patient in [employer] Discharge program: No unclear disposition at this time.  [place] team will follow.  Domenic Moras MSN, RN, FNP-BC CWON Wound, Ostomy, Continence Nurse Pager [phone]

## 2019-02-28 NOTE — Progress Notes (Signed)
Physical Therapy Treatment Patient Details Name: Marie Levy MRN: [id number] DOB: [birth year] Today's Date: 02/28/2019    History of Present Illness 73 y.o. female with hx of hypothyroidism whom presented to the ED 02/20/19 with complaints of abdominal pain,  recently referred to our office as a referral by Dr. Benson Norway for newly diagnosed rectal cancer - near obstructing; s/p expl lap, lower anterior resection and end colostomy on 02/21/19    PT Comments    Pt continues fatigued and with abdominal discomfort but motivated to progress and ambulating significantly increased distance this pm.   Follow Up Recommendations  Home health PT;Supervision - Intermittent     Equipment Recommendations  Rolling walker with 5" wheels    Recommendations for Other Services       Precautions / Restrictions Precautions Precautions: Fall;Other (comment) Precaution Comments: JP drain in place on L Restrictions Weight Bearing Restrictions: No    Mobility  Bed Mobility Overal bed mobility: Needs Assistance Bed Mobility: Rolling;Sidelying to Sit Rolling: Min assist Sidelying to sit: Min assist       General bed mobility comments: assisted Bil LEs over EOB and trunk to upright  Transfers   Equipment used: Rolling walker (2 wheeled) Transfers: Sit to/from Stand Sit to Stand: Supervision;Min guard;From elevated surface         General transfer comment: cues for use of UEs to self assist  Ambulation/Gait Ambulation/Gait assistance: Min guard Gait Distance (Feet): 450 Feet Assistive device: Rolling walker (2 wheeled) Gait Pattern/deviations: Step-through pattern;Decreased stride length;Trunk flexed Gait velocity: decr   General Gait Details: increased time with slow steady pace to ambulate increased distance   Stairs             Wheelchair Mobility    Modified Rankin (Stroke Patients Only)       Balance Overall balance assessment: Needs assistance Sitting-balance  support: No upper extremity supported;Feet supported Sitting balance-Leahy Scale: Fair     Standing balance support: No upper extremity supported Standing balance-Leahy Scale: Fair                              Cognition Arousal/Alertness: Awake/alert Behavior During Therapy: WFL for tasks assessed/performed Overall Cognitive Status: Within Functional Limits for tasks assessed                                        Exercises      General Comments        Pertinent Vitals/Pain Pain Assessment: Faces Pain Score: 6  Pain Location: right  side with activity Pain Descriptors / Indicators: Discomfort;Cramping;Tender Pain Intervention(s): Limited activity within patient's tolerance;Monitored during session    Home Living                      Prior Function            PT Goals (current goals can now be found in the care plan section) Acute Rehab PT Goals Patient Stated Goal: get better PT Goal Formulation: With patient Time For Goal Achievement: 02/28/19 Potential to Achieve Goals: Good Progress towards PT goals: Progressing toward goals    Frequency    Min 3X/week      PT Plan Current plan remains appropriate    Co-evaluation              AM-PAC PT "6 Clicks"  Mobility   Outcome Measure  Help needed turning from your back to your side while in a flat bed without using bedrails?: A Little Help needed moving from lying on your back to sitting on the side of a flat bed without using bedrails?: A Little Help needed moving to and from a bed to a chair (including a wheelchair)?: A Little Help needed standing up from a chair using your arms (e.g., wheelchair or bedside chair)?: A Little Help needed to walk in hospital room?: A Little Help needed climbing 3-5 steps with a railing? : A Little 6 Click Score: 18    End of Session Equipment Utilized During Treatment: Gait belt Activity Tolerance: Patient tolerated treatment  well Patient left: in chair;with call bell/phone within reach;with chair alarm set Nurse Communication: Mobility status PT Visit Diagnosis: Unsteadiness on feet (R26.81);Difficulty in walking, not elsewhere classified (R26.2)     Time: 0383-3383 PT Time Calculation (min) (ACUTE ONLY): 20 min  Charges:  $Gait Training: 8-22 mins                     [place] Pager [phone] Office [phone]    Yutaka Holberg 02/28/2019, 3:39 PM

## 2019-02-28 NOTE — Progress Notes (Signed)
[facility] Progress Note  8 Days Post-Op  Subjective: CC: nausea Patient with increased nausea since yesterday, but keeping fulls down. Some abdominal pain - feels mostly like gas pain. Ambulated 2x yesterday. Worked with nursing on emptying colostomy yesterday.   Objective: Vital signs in last 24 hours: Temp:  [98.1 F (36.7 C)-98.9 F (37.2 C)] 98.1 F (36.7 C) (03/27 0524) Pulse Rate:  [87-99] 87 (03/27 0524) Resp:  [16-18] 18 (03/27 0524) BP: (100-118)/(63-84) 118/75 (03/27 0524) SpO2:  [95 %-98 %] 96 % (03/27 0524) Last BM Date: 02/20/19  Intake/Output from previous day: 03/26 0701 - 03/27 0700 In: 1448.6 [P.O.:897; I.V.:309; IV Piggyback:242.7] Out: 1715 [Urine:1150; Drains:15; Stool:550] Intake/Output this shift: No intake/output data recorded.  PE: Gen: Alert, NAD, pleasant Card: Regular rate and rhythm Pulm: Normal effort Abd: Soft,generalizedttp, non-distended,+BS, midline incision with some slough in base and increased tan purulent drainage, stoma pinkwith stool in ostomy bag, drain with tan purulent drainage in tubing Skin: warm and dry, no rashes  Psych: A&Ox3  Lab Results:  Recent Labs    02/27/19 0322 02/28/19 0247  WBC 23.9* 22.2*  HGB 8.9* 8.4*  HCT 30.1* 27.6*  PLT 491* 530*   BMET Recent Labs    02/27/19 0322 02/28/19 0247  NA 129* 130*  K 5.1 4.3  CL 99 99  CO2 24 24  GLUCOSE 110* 100*  BUN 7* 12  CREATININE 0.73 0.61  CALCIUM 7.8* 7.6*   PT/INR No results for input(s): LABPROT, INR in the last 72 hours. CMP     Component Value Date/Time   NA 130 (L) 02/28/2019 0247   K 4.3 02/28/2019 0247   CL 99 02/28/2019 0247   CO2 24 02/28/2019 0247   GLUCOSE 100 (H) 02/28/2019 0247   BUN 12 02/28/2019 0247   CREATININE 0.61 02/28/2019 0247   CALCIUM 7.6 (L) 02/28/2019 0247   PROT 4.9 (L) 02/24/2019 0830   ALBUMIN 1.7 (L) 02/24/2019 0830   AST 14 (L) 02/24/2019 0830   ALT 12 02/24/2019 0830   ALKPHOS 71  02/24/2019 0830   BILITOT 0.5 02/24/2019 0830   GFRNONAA >60 02/28/2019 0247   GFRAA >60 02/28/2019 0247   Lipase     Component Value Date/Time   LIPASE 24 02/20/2019 1820       Studies/Results: No results found.  Anti-infectives: Anti-infectives (From admission, onward)   Start     Dose/Rate Route Frequency Ordered Stop   02/26/19 1200  piperacillin-tazobactam (ZOSYN) IVPB 3.375 g     3.375 g 12.5 mL/hr over 240 Minutes Intravenous Every 8 hours 02/26/19 0922     02/20/19 1945  piperacillin-tazobactam (ZOSYN) IVPB 2.25 g  Status:  Discontinued     2.25 g 100 mL/hr over 30 Minutes Intravenous Every 6 hours 02/20/19 1935 02/20/19 1941   02/20/19 1945  piperacillin-tazobactam (ZOSYN) IVPB 3.375 g     3.375 g 100 mL/hr over 30 Minutes Intravenous  Once 02/20/19 1942 02/20/19 2042       Assessment/Plan Perforated viscus Hyponatremia- Na 130, continue to monitor Leukocytosis- WBC 22.2, patient afeb, zosyn restarted 3/25  POD8, s/p LAR with new end colostomy for perforated rectaladenocarcinoma - Increase dressing changes to QID - WOCfollowing - JP with 15 cc out, appears more purulent this AM - continue to monitor - mobilize!!!and pulm toilet - PTrecommending HH PT - having stool output, advance to FLD - path report - invasive adenocarcinoma with positive distal margin (T3N0), patient scheduled for outpatient appt  Severe protein calorie  malnutrition - prealbumin <5, ordered ensure and prostat, appreciate nutrition input  FEN -FLD VTE -SQheparin ID -zosyn 3/19; Zosyn 3/25>> Follow up: Dr. [name]   Plan: continue FLD. Increase to QID dressing changes. Monitor WBC and JP output.   LOS: 8 days    Brigid Re , [facility] 02/28/2019, 8:29 AM Pager: [phone] Consults: [phone]

## 2019-02-28 NOTE — Care Management Important Message (Signed)
Important Message  Patient Details  Name: Marie Levy MRN: [id number] Date of Birth: [birth year]   Medicare Important Message Given:  Yes    Kerin Salen 02/28/2019, 10:45 AMImportant Message  Patient Details  Name: Marie Levy MRN: [id number] Date of Birth: [birth year]   Medicare Important Message Given:  Yes    Kerin Salen 02/28/2019, 10:45 AM

## 2019-02-28 NOTE — Plan of Care (Signed)
  Problem: Clinical Measurements: Goal: Postoperative complications will be avoided or minimized Outcome: Progressing   Problem: Skin Integrity: Goal: Demonstration of wound healing without infection will improve Outcome: Progressing   Problem: Clinical Measurements: Goal: Will remain free from infection Outcome: Progressing Goal: Respiratory complications will improve Outcome: Progressing   Problem: Elimination: Goal: Will not experience complications related to bowel motility Outcome: Progressing   Problem: Safety: Goal: Ability to remain free from injury will improve Outcome: Progressing

## 2019-03-01 LAB — CBC
HCT: 31.6 % — ABNORMAL LOW (ref 36.0–46.0)
Hemoglobin: 9.2 g/dL — ABNORMAL LOW (ref 12.0–15.0)
MCH: 22.9 pg — AB (ref 26.0–34.0)
MCHC: 29.1 g/dL — ABNORMAL LOW (ref 30.0–36.0)
MCV: 78.8 fL — ABNORMAL LOW (ref 80.0–100.0)
Platelets: 631 10*3/uL — ABNORMAL HIGH (ref 150–400)
RBC: 4.01 MIL/uL (ref 3.87–5.11)
RDW: 15.6 % — ABNORMAL HIGH (ref 11.5–15.5)
WBC: 20.6 10*3/uL — ABNORMAL HIGH (ref 4.0–10.5)
nRBC: 0 % (ref 0.0–0.2)

## 2019-03-01 LAB — BASIC METABOLIC PANEL
Anion gap: 10 (ref 5–15)
BUN: 14 mg/dL (ref 8–23)
CALCIUM: 7.7 mg/dL — AB (ref 8.9–10.3)
CO2: 24 mmol/L (ref 22–32)
CREATININE: 0.66 mg/dL (ref 0.44–1.00)
Chloride: 95 mmol/L — ABNORMAL LOW (ref 98–111)
GFR calc Af Amer: >60 mL/min (ref >60)
GFR calc non Af Amer: >60 mL/min (ref >60)
Glucose, Bld: 72 mg/dL (ref 70–99)
Potassium: 3.8 mmol/L (ref 3.5–5.1)
Sodium: 129 mmol/L — ABNORMAL LOW (ref 135–145)

## 2019-03-01 NOTE — Progress Notes (Signed)
9 Days Post-Op   Subjective/Chief Complaint: Complains of nausea.   Objective: Vital signs in last 24 hours: Temp:  [98.5 F (36.9 C)-98.6 F (37 C)] 98.6 F (37 C) (03/28 0439) Pulse Rate:  [86-100] 100 (03/28 0439) Resp:  [16] 16 (03/28 0439) BP: (118-125)/(74-81) 125/81 (03/28 0439) SpO2:  [93 %-98 %] 93 % (03/28 0439) Last BM Date: 02/28/19  Intake/Output from previous day: 03/27 0701 - 03/28 0700 In: 527.8 [P.O.:300; I.V.:87.5; IV Piggyback:140.3] Out: 1201.5 [Urine:750; Drains:1.5; Stool:450] Intake/Output this shift: No intake/output data recorded.  General appearance: alert and cooperative Resp: clear to auscultation bilaterally Cardio: regular rate and rhythm GI: soft, mild tenderness. ostomy pink and productive. wound clean  Lab Results:  Recent Labs    02/28/19 0247 03/01/19 0242  WBC 22.2* 20.6*  HGB 8.4* 9.2*  HCT 27.6* 31.6*  PLT 530* 631*   BMET Recent Labs    02/28/19 0247 03/01/19 0242  NA 130* 129*  K 4.3 3.8  CL 99 95*  CO2 24 24  GLUCOSE 100* 72  BUN 12 14  CREATININE 0.61 0.66  CALCIUM 7.6* 7.7*   PT/INR No results for input(s): LABPROT, INR in the last 72 hours. ABG No results for input(s): PHART, HCO3 in the last 72 hours.  Invalid input(s): PCO2, PO2  Studies/Results: No results found.  Anti-infectives: Anti-infectives (From admission, onward)   Start     Dose/Rate Route Frequency Ordered Stop   02/26/19 1200  piperacillin-tazobactam (ZOSYN) IVPB 3.375 g     3.375 g 12.5 mL/hr over 240 Minutes Intravenous Every 8 hours 02/26/19 0922     02/20/19 1945  piperacillin-tazobactam (ZOSYN) IVPB 2.25 g  Status:  Discontinued     2.25 g 100 mL/hr over 30 Minutes Intravenous Every 6 hours 02/20/19 1935 02/20/19 1941   02/20/19 1945  piperacillin-tazobactam (ZOSYN) IVPB 3.375 g     3.375 g 100 mL/hr over 30 Minutes Intravenous  Once 02/20/19 1942 02/20/19 2042      Assessment/Plan: s/p Procedure(s): EXPLORATORY LAPAROTOMY  (N/A) LOWER ANTERIOR RESECTION WITH COLOSTOMY (N/A) Likely has ileus from perforation  Continue zosyn. Wbc elevated but slowly coming down. Continue drain. If wbc does not normalize soon then consider CT(last one was 4 days ago) ambulate  LOS: 9 days    Marie Levy 03/01/2019

## 2019-03-01 NOTE — Progress Notes (Signed)
PT Cancellation Note  Patient Details Name: Marie Levy MRN: [id number] DOB: 15-Dec-1945   Cancelled Treatment:     PT attempted am and pm but pt unable to participate 2* ongoing nausea and fatigue.  Will follow.   Temisha Murley 03/01/2019, 3:45 PM

## 2019-03-01 NOTE — Progress Notes (Signed)
Pt stable at this time. No needs at time of bedside report with Martyn Ehrich. No signs of distress.

## 2019-03-01 NOTE — Plan of Care (Signed)
  Problem: Skin Integrity: Goal: Demonstration of wound healing without infection will improve Outcome: Progressing   Problem: Health Behavior/Discharge Planning: Goal: Ability to manage health-related needs will improve Outcome: Progressing   Problem: Clinical Measurements: Goal: Ability to maintain clinical measurements within normal limits will improve Outcome: Progressing   Problem: Clinical Measurements: Goal: Will remain free from infection Outcome: Progressing   Problem: Clinical Measurements: Goal: Diagnostic test results will improve Outcome: Progressing   Problem: Clinical Measurements: Goal: Respiratory complications will improve Outcome: Progressing   Problem: Activity: Goal: Risk for activity intolerance will decrease Outcome: Progressing

## 2019-03-02 MED ORDER — THYROID 60 MG PO TABS
90.0000 mg | ORAL_TABLET | ORAL | Status: DC
  Administered 2019-03-03 – 2019-03-04 (×2): 90 mg via ORAL
  Filled 2019-03-02 (×3): qty 1

## 2019-03-02 NOTE — Progress Notes (Signed)
Physical Therapy Treatment Patient Details Name: Marie Levy MRN: [id number] DOB: Sep 19, 1946 Today's Date: 03/02/2019    History of Present Illness 73 y.o. female with hx of hypothyroidism whom presented to the ED 02/20/19 with complaints of abdominal pain,  recently referred to our office as a referral by Dr. Benson Norway for newly diagnosed rectal cancer - near obstructing; s/p expl lap, lower anterior resection and end colostomy on 02/21/19    PT Comments    Pt with marked improvement in activity tolerance this pm and with decreased assist required for all mobility tasks.   Follow Up Recommendations  Home health PT;Supervision - Intermittent     Equipment Recommendations  Rolling walker with 5" wheels    Recommendations for Other Services       Precautions / Restrictions Precautions Precautions: Fall;Other (comment) Precaution Comments: JP drain in place on R Restrictions Weight Bearing Restrictions: No    Mobility  Bed Mobility Overal bed mobility: Needs Assistance Bed Mobility: Rolling;Sidelying to Sit Rolling: Supervision Sidelying to sit: Min guard       General bed mobility comments: Increased time with use of bedrail but no physical assist  Transfers Overall transfer level: Needs assistance Equipment used: Rolling walker (2 wheeled) Transfers: Sit to/from Stand Sit to Stand: Supervision;Min guard;From elevated surface         General transfer comment: cues for use of UEs to self assist  Ambulation/Gait Ambulation/Gait assistance: Min guard Gait Distance (Feet): 900 Feet Assistive device: Rolling walker (2 wheeled) Gait Pattern/deviations: Step-through pattern;Decreased stride length;Trunk flexed Gait velocity: decr   General Gait Details: increased time with steady pace to ambulate increased distance   Stairs             Wheelchair Mobility    Modified Rankin (Stroke Patients Only)       Balance Overall balance assessment: Needs  assistance Sitting-balance support: No upper extremity supported;Feet supported Sitting balance-Leahy Scale: Good     Standing balance support: No upper extremity supported Standing balance-Leahy Scale: Fair                              Cognition Arousal/Alertness: Awake/alert Behavior During Therapy: WFL for tasks assessed/performed Overall Cognitive Status: Within Functional Limits for tasks assessed                                        Exercises      General Comments        Pertinent Vitals/Pain Pain Assessment: 0-10 Pain Score: 2  Pain Location: right  side with activity Pain Descriptors / Indicators: Discomfort;Cramping;Tender Pain Intervention(s): Limited activity within patient's tolerance;Monitored during session    Home Living                      Prior Function            PT Goals (current goals can now be found in the care plan section) Acute Rehab PT Goals Patient Stated Goal: get better PT Goal Formulation: With patient Time For Goal Achievement: 02/28/19 Potential to Achieve Goals: Good Progress towards PT goals: Progressing toward goals    Frequency    Min 3X/week      PT Plan Current plan remains appropriate    Co-evaluation              AM-PAC PT "  6 Clicks" Mobility   Outcome Measure  Help needed turning from your back to your side while in a flat bed without using bedrails?: A Little Help needed moving from lying on your back to sitting on the side of a flat bed without using bedrails?: A Little Help needed moving to and from a bed to a chair (including a wheelchair)?: A Little Help needed standing up from a chair using your arms (e.g., wheelchair or bedside chair)?: A Little Help needed to walk in hospital room?: A Little Help needed climbing 3-5 steps with a railing? : A Little 6 Click Score: 18    End of Session Equipment Utilized During Treatment: Gait belt Activity Tolerance:  Patient tolerated treatment well Patient left: Other (comment)(bathroom - CNA aware) Nurse Communication: Mobility status PT Visit Diagnosis: Unsteadiness on feet (R26.81);Difficulty in walking, not elsewhere classified (R26.2)     Time: 3546-5681 PT Time Calculation (min) (ACUTE ONLY): 23 min  Charges:  $Gait Training: 23-37 mins                     [place] Pager [phone] Office [phone]    Josalyn Dettmann 03/02/2019, 5:02 PM

## 2019-03-02 NOTE — Progress Notes (Signed)
10 Days Post-Op   Subjective/Chief Complaint: Feels much better today. Less nausea.    Objective: Vital signs in last 24 hours: Temp:  [97.8 F (36.6 C)-98.5 F (36.9 C)] 98.3 F (36.8 C) (03/29 0655) Pulse Rate:  [84-91] 84 (03/29 0655) Resp:  [14-16] 14 (03/29 0655) BP: (113-121)/(63-71) 121/71 (03/29 0655) SpO2:  [95 %-99 %] 99 % (03/29 0655) Last BM Date: 03/01/19  Intake/Output from previous day: 03/28 0701 - 03/29 0700 In: 414.1 [P.O.:240; I.V.:80.5; IV Piggyback:93.6] Out: 2485 [Urine:1450; Drains:5; TMAUQ:3335] Intake/Output this shift: Total I/O In: 121.8 [P.O.:60; IV Piggyback:61.8] Out: 0   General appearance: alert and cooperative Resp: clear to auscultation bilaterally Cardio: regular rate and rhythm GI: soft, mild tenderness. ostomy pink and productive. wound clean  Lab Results:  Recent Labs    02/28/19 0247 03/01/19 0242  WBC 22.2* 20.6*  HGB 8.4* 9.2*  HCT 27.6* 31.6*  PLT 530* 631*   BMET Recent Labs    02/28/19 0247 03/01/19 0242  NA 130* 129*  K 4.3 3.8  CL 99 95*  CO2 24 24  GLUCOSE 100* 72  BUN 12 14  CREATININE 0.61 0.66  CALCIUM 7.6* 7.7*   PT/INR No results for input(s): LABPROT, INR in the last 72 hours. ABG No results for input(s): PHART, HCO3 in the last 72 hours.  Invalid input(s): PCO2, PO2  Studies/Results: No results found.  Anti-infectives: Anti-infectives (From admission, onward)   Start     Dose/Rate Route Frequency Ordered Stop   02/26/19 1200  piperacillin-tazobactam (ZOSYN) IVPB 3.375 g     3.375 g 12.5 mL/hr over 240 Minutes Intravenous Every 8 hours 02/26/19 0922     02/20/19 1945  piperacillin-tazobactam (ZOSYN) IVPB 2.25 g  Status:  Discontinued     2.25 g 100 mL/hr over 30 Minutes Intravenous Every 6 hours 02/20/19 1935 02/20/19 1941   02/20/19 1945  piperacillin-tazobactam (ZOSYN) IVPB 3.375 g     3.375 g 100 mL/hr over 30 Minutes Intravenous  Once 02/20/19 1942 02/20/19 2042       Assessment/Plan: s/p Procedure(s): EXPLORATORY LAPAROTOMY (N/A) LOWER ANTERIOR RESECTION WITH COLOSTOMY (N/A) Advance diet. Allow soft diet today Ambulate Continue zosyn. Check wbc tomorrow Continue drain  LOS: 10 days    Marie Levy 03/02/2019

## 2019-03-03 LAB — CBC WITH DIFFERENTIAL/PLATELET
Abs Immature Granulocytes: 0.62 10*3/uL — ABNORMAL HIGH (ref 0.00–0.07)
Basophils Absolute: 0.1 10*3/uL (ref 0.0–0.1)
Basophils Relative: 1 %
EOS ABS: 0.1 10*3/uL (ref 0.0–0.5)
Eosinophils Relative: 1 %
HCT: 32.7 % — ABNORMAL LOW (ref 36.0–46.0)
Hemoglobin: 9.6 g/dL — ABNORMAL LOW (ref 12.0–15.0)
Immature Granulocytes: 5 %
Lymphocytes Relative: 23 %
Lymphs Abs: 3.1 10*3/uL (ref 0.7–4.0)
MCH: 23.2 pg — ABNORMAL LOW (ref 26.0–34.0)
MCHC: 29.4 g/dL — ABNORMAL LOW (ref 30.0–36.0)
MCV: 79 fL — ABNORMAL LOW (ref 80.0–100.0)
Monocytes Absolute: 1.3 10*3/uL — ABNORMAL HIGH (ref 0.1–1.0)
Monocytes Relative: 10 %
Neutro Abs: 8.4 10*3/uL — ABNORMAL HIGH (ref 1.7–7.7)
Neutrophils Relative %: 60 %
Platelets: 588 10*3/uL — ABNORMAL HIGH (ref 150–400)
RBC: 4.14 MIL/uL (ref 3.87–5.11)
RDW: 15.9 % — ABNORMAL HIGH (ref 11.5–15.5)
WBC: 13.6 10*3/uL — ABNORMAL HIGH (ref 4.0–10.5)
nRBC: 0 % (ref 0.0–0.2)

## 2019-03-03 LAB — BASIC METABOLIC PANEL
Anion gap: 10 (ref 5–15)
BUN: 9 mg/dL (ref 8–23)
CO2: 23 mmol/L (ref 22–32)
Calcium: 7.8 mg/dL — ABNORMAL LOW (ref 8.9–10.3)
Chloride: 97 mmol/L — ABNORMAL LOW (ref 98–111)
Creatinine, Ser: 0.53 mg/dL (ref 0.44–1.00)
GFR calc Af Amer: >60 mL/min (ref >60)
GFR calc non Af Amer: >60 mL/min (ref >60)
Glucose, Bld: 86 mg/dL (ref 70–99)
Potassium: 3.6 mmol/L (ref 3.5–5.1)
Sodium: 130 mmol/L — ABNORMAL LOW (ref 135–145)

## 2019-03-03 MED ORDER — DOCUSATE SODIUM 100 MG PO CAPS
100.0000 mg | ORAL_CAPSULE | Freq: Two times a day (BID) | ORAL | Status: DC
  Administered 2019-03-03 – 2019-03-05 (×5): 100 mg via ORAL
  Filled 2019-03-03 (×4): qty 1

## 2019-03-03 NOTE — Progress Notes (Signed)
Patient refuses to use incentive spirometer, stating that she is "doing yoga breathing, which is much better". Donne Hazel, RN

## 2019-03-03 NOTE — Consult Note (Signed)
In to see patient, pouch changed on Friday intact, doing well for patient. She is trying to possibly go to SNF short term (does not want family to have to do TID dressings).  She may DC to SNF or to home in the next 24-48 hours per MD notes. We will change her pouch in the am in order to DC with new pouch in place.   Education: Discussed ostomy supplies, she is concerned with current situation about how she will get deliveries.  HHRN if ordered or SNF staff to provide while under their care. They can assist with transition to ordering of supplies for the patient.   Enrolled patient in [place] program today.  [place], [place], [place]

## 2019-03-03 NOTE — Progress Notes (Signed)
Physical Therapy Treatment Patient Details Name: Marie Levy MRN: [id number] DOB: 07-15-1946 Today's Date: 03/03/2019    History of Present Illness 73 y.o. female with hx of hypothyroidism whom presented to the ED 02/20/19 with complaints of abdominal pain,  recently referred to our office as a referral by Dr. Benson Norway for newly diagnosed rectal cancer - near obstructing; s/p expl lap, lower anterior resection and end colostomy on 02/21/19    PT Comments    Pt yelling out upon entering room (call bell in chair, pt in bed) and requested using BSC quickly.  Pt assisted to/from [facility].  Pt able to perform her own pericare.  Pt declined ambulating due to fatigue and feeling tired since "getting up every hour to pee" last night.  Encouraged pt to ambulate with nursing staff this afternoon.   Follow Up Recommendations  Home health PT;Supervision - Intermittent     Equipment Recommendations  Rolling walker with 5" wheels    Recommendations for Other Services       Precautions / Restrictions Precautions Precautions: Fall;Other (comment) Precaution Comments: JP drain in place on R    Mobility  Bed Mobility Overal bed mobility: Needs Assistance Bed Mobility: Supine to Sit;Sit to Supine     Supine to sit: Min guard Sit to supine: Min guard   General bed mobility comments: Increased time with use of bedrail but no physical assist  Transfers Overall transfer level: Needs assistance Equipment used: None Transfers: Sit to/from Stand Sit to Stand: Min guard Stand pivot transfers: Min guard       General transfer comment: pt wished to quickly use BSC so min/guard for safety however requires UE support  Ambulation/Gait             General Gait Details: pt declined due to fatigue   Stairs             Wheelchair Mobility    Modified Rankin (Stroke Patients Only)       Balance                                            Cognition  Arousal/Alertness: Awake/alert Behavior During Therapy: WFL for tasks assessed/performed Overall Cognitive Status: Within Functional Limits for tasks assessed                                        Exercises      General Comments        Pertinent Vitals/Pain Pain Assessment: Faces Faces Pain Scale: Hurts a little bit Pain Location: right  side with activity Pain Descriptors / Indicators: Tender Pain Intervention(s): Monitored during session;Repositioned    Home Living                      Prior Function            PT Goals (current goals can now be found in the care plan section) Acute Rehab PT Goals PT Goal Formulation: With patient Time For Goal Achievement: 03/10/19 Potential to Achieve Goals: Good Progress towards PT goals: Progressing toward goals    Frequency    Min 3X/week      PT Plan Current plan remains appropriate    Co-evaluation  AM-PAC PT "6 Clicks" Mobility   Outcome Measure  Help needed turning from your back to your side while in a flat bed without using bedrails?: A Little Help needed moving from lying on your back to sitting on the side of a flat bed without using bedrails?: A Little Help needed moving to and from a bed to a chair (including a wheelchair)?: A Little Help needed standing up from a chair using your arms (e.g., wheelchair or bedside chair)?: A Little Help needed to walk in hospital room?: A Little Help needed climbing 3-5 steps with a railing? : A Little 6 Click Score: 18    End of Session   Activity Tolerance: Patient limited by fatigue Patient left: in bed;with call bell/phone within reach Nurse Communication: Mobility status PT Visit Diagnosis: Difficulty in walking, not elsewhere classified (R26.2)     Time: 8366-2947 PT Time Calculation (min) (ACUTE ONLY): 11 min  Charges:  $Therapeutic Activity: 8-22 mins                    Carmelia Bake, PT, DPT Acute Rehabilitation  Services Office: [phone] Pager: [phone]  York Ram E 03/03/2019, 1:00 PM

## 2019-03-03 NOTE — NC FL2 (Addendum)
[facility] LEVEL OF CARE SCREENING TOOL     IDENTIFICATION  Patient Name: Marie Levy Birthdate: May 11, 1946 Sex: female Admission Date (Current Location): 02/20/2019  [facility] and [place] Number:  Herbalist and Address:  [facility],  [place] [address] [place]      Provider Number: [id number]  Attending Physician Name and Address:  Edison Pace, Md, MD  Relative Name and Phone Number:       Current Level of Care: Hospital Recommended Level of Care: [place] Prior Approval Number:    Date Approved/Denied:   PASRR Number:   [id number]   Discharge Plan: SNF    Current Diagnoses: Patient Active Problem List   Diagnosis Date Noted  . Rectal cancer ([place]) 02/24/2019  . Perforated viscus 02/20/2019    Orientation RESPIRATION BLADDER Height & Weight     Self, Time, Situation, Place  Normal Continent Weight: 124 lb (56.2 kg) Height:  5\' 6"  (167.6 cm)  BEHAVIORAL SYMPTOMS/MOOD NEUROLOGICAL BOWEL NUTRITION STATUS      Colostomy Diet(Regular Diet )  AMBULATORY STATUS COMMUNICATION OF NEEDS Skin   Extensive Assist Verbally PU Stage and Appropriate Care(Partial Thickness loss of dermis, presenting as a shallow open ulcer w/ a pink wound bed without slough. small 1/2cm open area on inner, lower rt buttock & 1/4 open spot right below near sacrum)   PU Stage 2 Dressing: TID                   Personal Care Assistance Level of Assistance  Bathing, Feeding, Dressing Bathing Assistance: Maximum assistance Feeding assistance: Independent Dressing Assistance: Maximum assistance     Functional Limitations Info  Sight, Hearing, Speech Sight Info: Adequate Hearing Info: Adequate Speech Info: Adequate    SPECIAL CARE FACTORS FREQUENCY  PT (By licensed PT), OT (By licensed OT)     PT Frequency: 5x/week OT Frequency: 5x/week            Contractures Contractures Info: Not present    Additional Factors Info   Code Status, Allergies, Psychotropic Code Status Info: Fullcode Allergies Info: No Known Allergies           Current Medications (03/03/2019):  This is the current hospital active medication list Current Facility-Administered Medications  Medication Dose Route Frequency Provider Last Rate Last Dose  . 0.9 %  sodium chloride infusion   Intravenous PRN Chevis Pretty, MD 10 mL/hr at 03/02/19 1006    . acetaminophen (TYLENOL) tablet 650 mg  650 mg Oral Q6H PRN Rayburn, Kelly A, PA-C   650 mg at 03/02/19 0607  . chlorhexidine (PERIDEX) 0.12 % solution 15 mL  15 mL Mouth Rinse BID Rayburn, Kelly A, PA-C   15 mL at 03/03/19 1020  . diphenhydrAMINE (BENADRYL) 12.5 MG/5ML elixir 12.5 mg  12.5 mg Oral Q6H PRN Rayburn, Kelly A, PA-C       Or  . diphenhydrAMINE (BENADRYL) injection 12.5 mg  12.5 mg Intravenous Q6H PRN Rayburn, Kelly A, PA-C      . docusate sodium (COLACE) capsule 100 mg  100 mg Oral BID Jillyn Ledger, PA-C   100 mg at 03/03/19 1203  . feeding supplement (ENSURE SURGERY) liquid 237 mL  237 mL Oral BID BM Rayburn, Kelly A, PA-C   237 mL at 03/03/19 1025  . heparin injection 5,000 Units  5,000 Units Subcutaneous Q8H Rayburn, Kelly A, PA-C   5,000 Units at 03/03/19 1500  . HYDROmorphone (DILAUDID) injection 1 mg  1 mg Intravenous Q2H PRN Rayburn, Kelly A, PA-C   1 mg at 02/28/19 1749  . ibuprofen (ADVIL,MOTRIN) tablet 600 mg  600 mg Oral Q6H PRN Minda Ditto, RPH      . iohexol (OMNIPAQUE) 300 MG/ML solution 15 mL  15 mL Intravenous Once PRN Rayburn, Kelly A, PA-C   30 mL at 02/25/19 1659  . MEDLINE mouth rinse  15 mL Mouth Rinse q12n4p Rayburn, Kelly A, PA-C   15 mL at 03/03/19 1511  . methocarbamol (ROBAXIN) tablet 750 mg  750 mg Oral TID Rayburn, Kelly A, PA-C   750 mg at 03/03/19 1505  . metoprolol tartrate (LOPRESSOR) injection 5 mg  5 mg Intravenous Q6H PRN Rayburn, Kelly A, PA-C      . ondansetron (ZOFRAN-ODT) disintegrating tablet 4 mg  4 mg Oral Q6H PRN Rayburn, Kelly A,  PA-C   4 mg at 02/25/19 0448   Or  . ondansetron (ZOFRAN) injection 4 mg  4 mg Intravenous Q6H PRN Rayburn, Kelly A, PA-C   4 mg at 03/01/19 1610  . oxyCODONE (Oxy IR/ROXICODONE) immediate release tablet 5-10 mg  5-10 mg Oral Q4H PRN Rayburn, Kelly A, PA-C   5 mg at 03/03/19 1155  . phenol (CHLORASEPTIC) mouth spray 1 spray  1 spray Mouth/Throat PRN Rayburn, Kelly A, PA-C      . piperacillin-tazobactam (ZOSYN) IVPB 3.375 g  3.375 g Intravenous Q8H Earnstine Regal, PA-C 12.5 mL/hr at 03/03/19 1514 3.375 g at 03/03/19 1514  . promethazine (PHENERGAN) injection 6.25 mg  6.25 mg Intravenous Q6H PRN Rayburn, Kelly A, PA-C   6.25 mg at 02/28/19 1340  . protein supplement (UNJURY CHICKEN SOUP) powder 8 oz  8 oz Oral TID WC Kinsinger, Arta Bruce, MD   8 oz at 03/01/19 0830  . simethicone (MYLICON) chewable tablet 40 mg  40 mg Oral Q6H PRN Rayburn, Kelly A, PA-C      . thyroid (ARMOUR) tablet 90 mg  90 mg Oral Once per day on Mon Tue Wed Thu Fri Minda Ditto, RPH   90 mg at 03/03/19 1022     Discharge Medications: Please see discharge summary for a list of discharge medications.  Relevant Imaging Results:  Relevant Lab Results:   Additional Information ssn: [id number]  Lia Hopping, LCSW

## 2019-03-03 NOTE — Care Management Important Message (Signed)
Important Message  Patient Details  Name: Marie Levy MRN: [id number] Date of Birth: 01-03-1946   Medicare Important Message Given:  Yes    Kerin Salen 03/03/2019, 9:52 AMImportant Message  Patient Details  Name: Marie Levy MRN: [id number] Date of Birth: 11-28-1946   Medicare Important Message Given:  Yes    Kerin Salen 03/03/2019, 9:52 AM

## 2019-03-03 NOTE — TOC Progression Note (Signed)
Transition of Care [facility]) - Progression Note    Patient Details  Name: Marie Levy MRN: [id number] Date of Birth: 10-31-1946  Transition of Care [facility]) CM/SW [place], [place] Phone Number: 03/03/2019, 4:21 PM  Clinical Narrative:    Patient report lives alone. Patient will not have support at home and is interested in SNF for rehab. Patient reports prior to surgery independent and feels "this whole ordeal has cramped my style, I am usually very active". [facility] at it is close to her home and she has visited the facility in the past. CSW explain SNF process and reached out to SNF with the patient permission.   Patient clinical information reviewed. SNF [facility] will accept the patient for rehab and wound care. CSW notified the patient. HTA authorization initiated. CSW will notify medical team when [name] is received.   Expected Discharge Plan:SNF-[facility]  Barriers to Discharge: No Barriers Identified  Expected Discharge Plan and Services Expected Discharge Plan: SNF-[facility]    Discharge Planning Services: CM Consult Post Acute Care Choice:SNF Living arrangements for the past 2 months: Single Family Home                     HH Arranged: RN, PT [facility] Agency: [facility] ([facility])   Social Determinants of Health (SDOH) Interventions    Readmission Risk Interventions No flowsheet data found.

## 2019-03-03 NOTE — Progress Notes (Addendum)
[facility] Progress Note  11 Days Post-Op  Subjective: CC: nausea Patient reports nausea with certain smells, she has reportedly always been sensitive to this. Tolerating soft diet. Having colostomy output. Patient is interested in possible SNF rehab at discharge.   Objective: Vital signs in last 24 hours: Temp:  [97.8 F (36.6 C)-98.6 F (37 C)] 97.8 F (36.6 C) (03/30 0527) Pulse Rate:  [69-79] 71 (03/30 0527) Resp:  [16-18] 18 (03/30 0527) BP: (107-127)/(76-82) 127/81 (03/30 0527) SpO2:  [96 %-100 %] 96 % (03/30 0527) Last BM Date: 03/02/19  Intake/Output from previous day: 03/29 0701 - 03/30 0700 In: 809.7 [P.O.:400; I.V.:239.4; IV Piggyback:170.4] Out: 2660 [Urine:2650; Drains:10] Intake/Output this shift: Total I/O In: -  Out: 505 [Urine:500; Stool:5]  PE: Gen: Alert, NAD, pleasant Card: Regular rate and rhythm Pulm: Normal effort Abd: Soft,generalizedttp, non-distended,+BS, midline incision with some slough in base, stoma pinkwithstool in ostomy bag, drain with lacteal fluid Skin: warm and dry, no rashes  Psych: A&Ox3  Lab Results:  Recent Labs    03/01/19 0242 03/03/19 0347  WBC 20.6* 13.6*  HGB 9.2* 9.6*  HCT 31.6* 32.7*  PLT 631* 588*   BMET Recent Labs    03/01/19 0242 03/03/19 0347  NA 129* 130*  K 3.8 3.6  CL 95* 97*  CO2 24 23  GLUCOSE 72 86  BUN 14 9  CREATININE 0.66 0.53  CALCIUM 7.7* 7.8*   PT/INR No results for input(s): LABPROT, INR in the last 72 hours. CMP     Component Value Date/Time   NA 130 (L) 03/03/2019 0347   K 3.6 03/03/2019 0347   CL 97 (L) 03/03/2019 0347   CO2 23 03/03/2019 0347   GLUCOSE 86 03/03/2019 0347   BUN 9 03/03/2019 0347   CREATININE 0.53 03/03/2019 0347   CALCIUM 7.8 (L) 03/03/2019 0347   PROT 4.9 (L) 02/24/2019 0830   ALBUMIN 1.7 (L) 02/24/2019 0830   AST 14 (L) 02/24/2019 0830   ALT 12 02/24/2019 0830   ALKPHOS 71 02/24/2019 0830   BILITOT 0.5 02/24/2019 0830   GFRNONAA  >60 03/03/2019 0347   GFRAA >60 03/03/2019 0347   Lipase     Component Value Date/Time   LIPASE 24 02/20/2019 1820       Studies/Results: No results found.  Anti-infectives: Anti-infectives (From admission, onward)   Start     Dose/Rate Route Frequency Ordered Stop   02/26/19 1200  piperacillin-tazobactam (ZOSYN) IVPB 3.375 g     3.375 g 12.5 mL/hr over 240 Minutes Intravenous Every 8 hours 02/26/19 0922     02/20/19 1945  piperacillin-tazobactam (ZOSYN) IVPB 2.25 g  Status:  Discontinued     2.25 g 100 mL/hr over 30 Minutes Intravenous Every 6 hours 02/20/19 1935 02/20/19 1941   02/20/19 1945  piperacillin-tazobactam (ZOSYN) IVPB 3.375 g     3.375 g 100 mL/hr over 30 Minutes Intravenous  Once 02/20/19 1942 02/20/19 2042       Assessment/Plan Perforated viscus Hyponatremia-Na 130, improving  Leukocytosis-WBC down to 13, patient afeb, zosyn restarted 3/25  POD11, 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 Burr Medico - 03/13/2019  Severe protein calorie malnutrition- prealbumin <5, ordered ensure and prostat, appreciate nutrition input  FEN -reg VTE -SQheparin ID -zosyn 3/19; Zosyn 3/25>> Follow up: Dr. [name]  Plan:dressing changes TID. Care management consult for possible SNF. Possible discharge in the next 24-48h  LOS: 11 days    Brigid Re , [facility] 03/03/2019, 9:31 AM Pager: [phone] Consults: [phone]  [Corona virus  days] Agree with above.  Needs to ambulate more. Will get social work involved. Probable discharge in 1 to 3 days.  Spoke with son, [name], about discharge.  She has a daughter who lives in [place].  Just 2 children.  She is divorced.   Alphonsa Overall, MD, [facility] Pager: [phone] Office phone:  [phone]

## 2019-03-04 LAB — BASIC METABOLIC PANEL
ANION GAP: 10 (ref 5–15)
BUN: 8 mg/dL (ref 8–23)
CO2: 22 mmol/L (ref 22–32)
Calcium: 7.7 mg/dL — ABNORMAL LOW (ref 8.9–10.3)
Chloride: 97 mmol/L — ABNORMAL LOW (ref 98–111)
Creatinine, Ser: 0.63 mg/dL (ref 0.44–1.00)
GFR calc Af Amer: >60 mL/min (ref >60)
GFR calc non Af Amer: >60 mL/min (ref >60)
Glucose, Bld: 121 mg/dL — ABNORMAL HIGH (ref 70–99)
POTASSIUM: 3.6 mmol/L (ref 3.5–5.1)
Sodium: 129 mmol/L — ABNORMAL LOW (ref 135–145)

## 2019-03-04 LAB — CBC
HCT: 31.3 % — ABNORMAL LOW (ref 36.0–46.0)
Hemoglobin: 9.8 g/dL — ABNORMAL LOW (ref 12.0–15.0)
MCH: 23.9 pg — ABNORMAL LOW (ref 26.0–34.0)
MCHC: 31.3 g/dL (ref 30.0–36.0)
MCV: 76.3 fL — ABNORMAL LOW (ref 80.0–100.0)
NRBC: 0 % (ref 0.0–0.2)
Platelets: 624 10*3/uL — ABNORMAL HIGH (ref 150–400)
RBC: 4.1 MIL/uL (ref 3.87–5.11)
RDW: 16.2 % — ABNORMAL HIGH (ref 11.5–15.5)
WBC: 12.5 10*3/uL — ABNORMAL HIGH (ref 4.0–10.5)

## 2019-03-04 LAB — PREALBUMIN: Prealbumin: 11.8 mg/dL — ABNORMAL LOW (ref 18–38)

## 2019-03-04 MED ORDER — OXYCODONE HCL 5 MG PO TABS: 5.0000 mg | ORAL_TABLET | ORAL | 0 refills | Status: DC | PRN

## 2019-03-04 MED ORDER — IBUPROFEN 600 MG PO TABS: 600.0000 mg | ORAL_TABLET | Freq: Four times a day (QID) | ORAL | Status: DC | PRN

## 2019-03-04 MED ORDER — ACETAMINOPHEN 325 MG PO TABS: 650.0000 mg | ORAL_TABLET | Freq: Four times a day (QID) | ORAL | Status: DC | PRN

## 2019-03-04 MED ORDER — AMOXICILLIN-POT CLAVULANATE 875-125 MG PO TABS: 1.0000 | ORAL_TABLET | Freq: Two times a day (BID) | ORAL | 0 refills | Status: DC

## 2019-03-04 MED ORDER — ENSURE SURGERY PO LIQD: 237.0000 mL | Freq: Two times a day (BID) | ORAL | Status: DC

## 2019-03-04 MED ORDER — ONDANSETRON 4 MG PO TBDP: 4.0000 mg | ORAL_TABLET | Freq: Four times a day (QID) | ORAL | Status: DC | PRN

## 2019-03-04 MED ORDER — METHOCARBAMOL 750 MG PO TABS: 750.0000 mg | ORAL_TABLET | Freq: Three times a day (TID) | ORAL | Status: DC | PRN

## 2019-03-04 MED ORDER — DOCUSATE SODIUM 100 MG PO CAPS: 100.0000 mg | ORAL_CAPSULE | Freq: Every day | ORAL | Status: DC | PRN

## 2019-03-04 NOTE — Consult Note (Signed)
[place] Nurse ostomy follow up Stoma type/location: LLQ, end colostomy Stomal assessment/size: 1 1/2" budded, pink, moist Peristomal assessment: intact  Treatment options for stomal/peristomal skin: 2" barrier ring Output green stool, loose  Ostomy pouching: 1pc. Convex 2" with barrier ring.  Education provided:   Patient completed change (cutting new skin barrier, measuring stoma, cleaning peristomal skin and stoma, use of barrier ring) Education on emptying when 1/3 to 1/2 full and how to empty Demonstrated use of wick to clean spout  Discussed bathing, diet, gas, medication use, constipation   Enrolled patient in [employer] Discharge program: Yes  Patient has DC orders to [facility], pending insurance auth.    Will follow along later in the week if patient still inpatient.   [place], [place], [place]

## 2019-03-04 NOTE — Progress Notes (Addendum)
[facility] Progress Note  12 Days Post-Op  Subjective: CC: no new complaints Patient wants to shower today. Tolerating diet. Having bowel function. Had one episode of worse pain overnight improved with pain medication. Hopeful to get to SNF today.   Objective: Vital signs in last 24 hours: Temp:  [97.8 F (36.6 C)-98 F (36.7 C)] 97.8 F (36.6 C) (03/31 0511) Pulse Rate:  [83-87] 85 (03/31 0511) Resp:  [15-17] 17 (03/31 0511) BP: (119-125)/(74-81) 124/74 (03/31 0511) SpO2:  [97 %-99 %] 97 % (03/31 0511) Last BM Date: 03/03/19  Intake/Output from previous day: 03/30 0701 - 03/31 0700 In: 1020 [P.O.:710; I.V.:210; IV Piggyback:100] Out: 2956 [Urine:3400; Drains:5; Stool:10] Intake/Output this shift: Total I/O In: 33.3 [I.V.:33.3] Out: -   PE: Gen: Alert, NAD, pleasant Card: Regular rate and rhythm Pulm: Normal effort Abd: Soft,generalizedttp, non-distended,+BS, midline incision with some slough in base, stoma pinkwithstool in ostomy bag Skin: warm and dry, no rashes  Psych: A&Ox3  Lab Results:  Recent Labs    03/03/19 0347 03/04/19 0305  WBC 13.6* 12.5*  HGB 9.6* 9.8*  HCT 32.7* 31.3*  PLT 588* 624*   BMET Recent Labs    03/03/19 0347 03/04/19 0305  NA 130* 129*  K 3.6 3.6  CL 97* 97*  CO2 23 22  GLUCOSE 86 121*  BUN 9 8  CREATININE 0.53 0.63  CALCIUM 7.8* 7.7*   PT/INR No results for input(s): LABPROT, INR in the last 72 hours. CMP     Component Value Date/Time   NA 129 (L) 03/04/2019 0305   K 3.6 03/04/2019 0305   CL 97 (L) 03/04/2019 0305   CO2 22 03/04/2019 0305   GLUCOSE 121 (H) 03/04/2019 0305   BUN 8 03/04/2019 0305   CREATININE 0.63 03/04/2019 0305   CALCIUM 7.7 (L) 03/04/2019 0305   PROT 4.9 (L) 02/24/2019 0830   ALBUMIN 1.7 (L) 02/24/2019 0830   AST 14 (L) 02/24/2019 0830   ALT 12 02/24/2019 0830   ALKPHOS 71 02/24/2019 0830   BILITOT 0.5 02/24/2019 0830   GFRNONAA >60 03/04/2019 0305   GFRAA >60  03/04/2019 0305   Lipase     Component Value Date/Time   LIPASE 24 02/20/2019 1820       Studies/Results: No results found.  Anti-infectives: Anti-infectives (From admission, onward)   Start     Dose/Rate Route Frequency Ordered Stop   02/26/19 1200  piperacillin-tazobactam (ZOSYN) IVPB 3.375 g     3.375 g 12.5 mL/hr over 240 Minutes Intravenous Every 8 hours 02/26/19 0922     02/20/19 1945  piperacillin-tazobactam (ZOSYN) IVPB 2.25 g  Status:  Discontinued     2.25 g 100 mL/hr over 30 Minutes Intravenous Every 6 hours 02/20/19 1935 02/20/19 1941   02/20/19 1945  piperacillin-tazobactam (ZOSYN) IVPB 3.375 g     3.375 g 100 mL/hr over 30 Minutes Intravenous  Once 02/20/19 1942 02/20/19 2042       Assessment/Plan Perforated viscus Hyponatremia-Na129,stable Leukocytosis-WBC down to 12, patient afeb, zosyn restarted 3/25  POD12, 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 Burr Medico - 03/27/2019  Severe protein calorie malnutrition- prealbumin 11.8, ordered ensure and prostat,appreciate nutrition input  FEN -reg VTE -SQheparin ID -zosyn 3/19; Zosyn 3/25>> Follow up: Dr. [name]  Plan:discharge pending insurance authorization for SNF  LOS: 12 days    Brigid Re , [facility] 03/04/2019, 9:25 AM Pager: [phone] Consults: [phone]  [Corona virus days] Agree with above. Really liked shower.  Attitude  looks much better today.  Ostomy looks good, she changed her bag.  She had bed at [facility], but they require a Covid-19 test (?). I spoke to son, [name], by phone.  Alphonsa Overall, MD, [facility] Pager: [phone] Office phone:  [phone]

## 2019-03-04 NOTE — Discharge Summary (Addendum)
[facility] Discharge Summary   Patient ID: Marie Levy MRN: [id number] DOB/AGE: [birth year] 73 y.o.  Admit date: 02/20/2019 Discharge date: 03/05/2019  Admitting Diagnosis: Perforated rectal cancer  Discharge Diagnosis Perforated rectal cancer Severe protein calorie malnutrition  Consultants Oncology  Imaging: No results found.  Procedures Dr. [name] (02/20/19) - exploratory laparotomy with LAR and end colostomy    Hospital Course:  Patient is a 73 year old female with known rectal cancer who presented to [facility] with abdominal pain.  Workup showed perforated viscus, assumed to be a perforation at site of rectal tumor.  Patient was admitted and underwent procedure listed above.  Tolerated procedure well and was transferred to the ICU post-operatively for some intermittent hypotension which resolved. Transferred out of ICU 3/23.  Patient initially developed post-operative ileus which was improving as of 3/25 and diet initiated. Diet was advanced as tolerated. On POD#13, the patient was voiding well, tolerating diet, ambulating well, pain well controlled, vital signs stable, incision stable and felt stable for discharge to home.  Patient will follow up in our office in 2 weeks and knows to call with questions or concerns. She will call to confirm appointment date/time.    PE: Gen: Alert, NAD, pleasant Card: Regular rate and rhythm Pulm: Normal effort Abd: Soft,generalizedttp, non-distended,+BS, midline incision with some slough in base, stoma pinkwithstool in ostomy bag Skin: warm and dry, no rashes  Psych: A&Ox3   Allergies as of 03/05/2019   No Known Allergies     Medication List    TAKE these medications   acetaminophen 325 MG tablet Commonly known as:  TYLENOL Take 2 tablets (650 mg total) by mouth every 6 (six) hours as needed for mild pain or fever.   amoxicillin-clavulanate 875-125 MG tablet Commonly known as:  Augmentin Take 1 tablet by mouth  every 12 (twelve) hours for 5 days.   Digestive Enzyme Caps Take 1 capsule by mouth daily.   docusate sodium 100 MG capsule Commonly known as:  COLACE Take 1 capsule (100 mg total) by mouth daily as needed for mild constipation.   ibuprofen 600 MG tablet Commonly known as:  ADVIL,MOTRIN Take 1 tablet (600 mg total) by mouth every 6 (six) hours as needed for mild pain (mild pain not relieved by tylenol).   magnesium 30 MG tablet Take 30 mg by mouth 2 (two) times daily.   methocarbamol 750 MG tablet Commonly known as:  ROBAXIN Take 1 tablet (750 mg total) by mouth every 8 (eight) hours as needed for muscle spasms.   NP Thyroid 90 MG tablet Generic drug:  thyroid Take 90 mg by mouth daily. Mon-Fri   oxyCODONE 5 MG immediate release tablet Commonly known as:  Oxy IR/ROXICODONE Take 1-2 tablets (5-10 mg total) by mouth every 4 (four) hours as needed for moderate pain or severe pain.   Selenium 100 MCG Caps Take 1 capsule by mouth daily.   vitamin C 100 MG tablet Take 100 mg by mouth daily.   VITAMIN D PO Take 30 mLs by mouth daily.            Discharge Care Instructions  (From admission, onward)         Start     Ordered   03/04/19 0000  Discharge wound care:    Comments:  TID wet to dry dressing to midline wound   03/04/19 1040           Follow-up Information    [name], MD. Daphane Shepherd  on 03/18/2019.   Specialty:  General Surgery Why:  Follow up appointment scheduled for 11:15 AM. Please arrive 30 min prior to appointment time. Bring photo ID and insurance information. Should be no charge for follow up since you are post-op.  Contact information: [place] [address] [phone]        [name], MD. Go on 03/27/2019.   Specialties:  Hematology, Oncology Why:  Appointment scheduled for 1:45 PM.  Contact information: [place] [address] [phone]           Signed: Brigid Re,  [facility] Surgery 03/05/2019, 8:40 AM Pager: [phone] Consults: [phone]  [Corona virus days] Agree with above.  Alphonsa Overall, MD, [facility] Surgery Pager: [phone] Office phone:  [phone]

## 2019-03-04 NOTE — Discharge Instructions (Signed)
MIDLINE WOUND CARE: - midline dressing to be changed three times daily - supplies: sterile saline, gauze, scissors, ABD pads, tape  - remove dressing and all packing carefully, moistening with sterile saline as needed to avoid packing/internal dressing sticking to the wound. - clean edges of skin around the wound with water/gauze, making sure there is no tape debris or leakage left on skin that could cause skin irritation or breakdown. - dampen and clean kerlix with sterile saline and pack wound from wound base to skin level, making sure to take note of any possible areas of wound tracking, tunneling and packing appropriately. Wound can be packed loosely. Trim kerlix to size if a whole gauze is not required. - cover wound with a dry ABD pad or gauze and secure with tape.  - write the date/time on the dry dressing/tape to better track when the last dressing change occurred. - apply any skin protectant/powder recommended by clinician to protect skin/skin folds. - change dressing as needed if leakage occurs, wound gets contaminated, or patient requests to shower. - patient may shower daily with wound open and following the shower the wound should be dried and a clean dressing placed.    [facility], [place] [phone]  OPEN ABDOMINAL SURGERY: POST OP INSTRUCTIONS  Always review your discharge instruction sheet given to you by the facility where your surgery was performed.  IF YOU HAVE DISABILITY OR FAMILY LEAVE FORMS, YOU MUST BRING THEM TO THE OFFICE FOR PROCESSING.  PLEASE DO NOT GIVE THEM TO YOUR DOCTOR.  1. A prescription for pain medication may be given to you upon discharge.  Take your pain medication as prescribed, if needed.  If narcotic pain medicine is not needed, then you may take acetaminophen (Tylenol) or ibuprofen (Advil) as needed. 2. Take your usually prescribed medications unless otherwise directed. 3. If you need a refill on your pain medication, please  contact your pharmacy. They will contact our office to request authorization.  Prescriptions will not be filled after 5pm or on week-ends. 4. You should follow a light diet the first few days after arrival home, such as soup and crackers, pudding, etc.unless your doctor has advised otherwise. A high-fiber, low fat diet can be resumed as tolerated.   Be sure to include lots of fluids daily. Most patients will experience some swelling and bruising on the chest and neck area.  Ice packs will help.  Swelling and bruising can take several days to resolve 5. Most patients will experience some swelling and bruising in the area of the incision. Ice pack will help. Swelling and bruising can take several days to resolve..  6. It is common to experience some constipation if taking pain medication after surgery.  Increasing fluid intake and taking a stool softener will usually help or prevent this problem from occurring.  A mild laxative (Milk of Magnesia or Miralax) should be taken according to package directions if there are no bowel movements after 48 hours. 7.  You may have steri-strips (small skin tapes) in place directly over the incision.  These strips should be left on the skin for 7-10 days.  If your surgeon used skin glue on the incision, you may shower in 24 hours.  The glue will flake off over the next 2-3 weeks.  Any sutures or staples will be removed at the office during your follow-up visit. You may find that a light gauze bandage over your incision may keep your staples from  being rubbed or pulled. You may shower and replace the bandage daily. 8. ACTIVITIES:  You may resume regular (light) daily activities beginning the next day--such as daily self-care, walking, climbing stairs--gradually increasing activities as tolerated.  You may have sexual intercourse when it is comfortable.  Refrain from any heavy lifting or straining until approved by your doctor. a. You may drive when you no longer are taking  prescription pain medication, you can comfortably wear a seatbelt, and you can safely maneuver your car and apply brakes 9. You should see your doctor in the office for a follow-up appointment approximately two weeks after your surgery.  Make sure that you call for this appointment within a day or two after you arrive home to insure a convenient appointment time.  WHEN TO CALL YOUR DOCTOR: 1. Fever over 101.0 2. Inability to urinate 3. Nausea and/or vomiting 4. Extreme swelling or bruising 5. Continued bleeding from incision. 6. Increased pain, redness, or drainage from the incision. 7. Difficulty swallowing or breathing 8. Muscle cramping or spasms. 9. Numbness or tingling in hands or feet or around lips.  The clinic staff is available to answer your questions during regular business hours.  Please dont hesitate to call and ask to speak to one of the nurses if you have concerns.  For further questions, please visit [email or web address]   Colostomy Home Guide, Adult  Colostomy surgery is done to create an opening in the front of the abdomen for stool (feces) to leave the body through an ostomy (stoma). Part of the large intestine is attached to the stoma. A bag, also called a pouch, is fitted over the stoma. Stool and gas will collect in the bag. After surgery, you will need to empty and change your colostomy bag as needed. You will also need to care for your stoma. How to care for the stoma Your stoma should look pink, red, and moist, like the inside of your cheek. Soon after surgery, the stoma may be swollen, but this swelling will go away within 6 weeks. To care for the stoma:  Keep the skin around the stoma clean and dry.  Use a clean, soft washcloth to gently wash the stoma and the skin around it. Clean using a circular motion, and wipe away from the stoma opening, not toward it. ? Use warm water and only use cleansers recommended by your health care provider. ? Rinse  the stoma area with plain water. ? Dry the area around the stoma well.  Use stoma powder or ointment on your skin only as told by your health care provider. Do not use any other powders, gels, wipes, or creams on the skin around the stoma.  Check the stoma area every day for signs of infection. Check for: ? New or worsening redness, swelling, or pain. ? New or increased fluid or blood. ? Pus or warmth.  Measure the stoma opening regularly and record the size. Watch for changes. (It is normal for the stoma to get smaller as swelling goes away.) Share this information with your health care provider. How to empty the colostomy bag  Empty your bag at bedtime and whenever it is one-third to one-half full. Do not let the bag get more than half-full with stool or gas. The bag could leak if it gets too full. Some colostomy bags have a built-in gas release valve that releases gas often throughout the day. Follow these basic steps: 1. Wash your hands with soap and water. 2. Sit far  back on the toilet seat. 3. Put several pieces of toilet paper into the toilet water. This will prevent splashing as you empty stool into the toilet. 4. Remove the clip or the hook-and-loop fastener from the tail end of the bag. 5. Unroll the tail, then empty the stool into the toilet. 6. Clean the tail with toilet paper or a moist towelette. 7. Reroll the tail, and close it with the clip or the hook-and-loop fastener. 8. Wash your hands again. How to change the colostomy bag Change your bag every 3-4 days or as often as told by your health care provider. Also change the bag if it is leaking or separating from the skin, or if your skin around the stoma looks or feels irritated. Irritated skin may be a sign that the bag is leaking. Always have colostomy supplies with you, and follow these basic steps: 1. Wash your hands with soap and water. Have paper towels or tissues nearby to clean any discharge. 2. Remove the old bag  and skin barrier. Use your fingers or a warm cloth to gently push the skin away from the barrier. 3. Clean the stoma area with water or with mild soap and water, as directed. Use water to rinse away any soap. 4. Dry the skin. You may use the cool setting on a hair dryer to do this. 5. Use a tracing pattern (template) to cut the skin barrier to the size needed. 6. If you are using a two-piece bag, attach the bag and the skin barrier to each other. Add the barrier ring, if you use one. 7. If directed, apply stoma powder or skin barrier gel to the skin. 8. Warm the skin barrier with your hands, or blow with a hair dryer for 5-10 seconds. 9. Remove the paper from the adhesive strip of the skin barrier. 10. Press the adhesive strip onto the skin around the stoma. 11. Gently rub the skin barrier onto the skin. This creates heat that helps the barrier to stick. 12. Apply stoma tape to the edges of the skin barrier, if desired. 18. Wash your hands again. General recommendations  Avoid wearing tight clothes or having anything press directly on your stoma or bag. Change your clothing whenever it is soiled or damp.  You may shower or bathe with the bag on or off. Do not use harsh or oily soaps or lotions. Dry the skin and bag after bathing.  Store all supplies in a cool, dry place. Do not leave supplies in extreme heat because some parts can melt or not stick as well.  Whenever you leave home, take extra clothing and an extra skin barrier and bag with you.  If your bag gets wet, you can dry it with a hair dryer on the cool setting.  To prevent odor, you may put drops of ostomy deodorizer in the bag.  If recommended by your health care provider, put ostomy lubricant inside the bag. This helps stool to slide out of the bag more easily and completely. Contact a health care provider if:  You have new or worsening redness, swelling, or pain around your stoma.  You have new or increased fluid or blood  coming from your stoma.  Your stoma feels warm to the touch.  You have pus coming from your stoma.  Your stoma extends in or out farther than normal.  You need to change your bag every day.  You have a fever. Get help right away if:  Your stool  is bloody.  You have nausea or you vomit.  You have trouble breathing. Summary  Measure your stoma opening regularly and record the size. Watch for changes.  Empty your bag at bedtime and whenever it is one-third to one-half full. Do not let the bag get more than half-full with stool or gas.  Change your bag every 3-4 days or as often as told by your health care provider.  Whenever you leave home, take extra clothing and an extra skin barrier and bag with you. This information is not intended to replace advice given to you by your health care provider. Make sure you discuss any questions you have with your health care provider. Document Released: 11/23/2003 Document Revised: 07/26/2018 Document Reviewed: 05/16/2017 Elsevier Interactive Patient Education  [employer].

## 2019-03-04 NOTE — Progress Notes (Signed)
Physical Therapy Treatment Patient Details Name: Marie Levy MRN: [id number] DOB: Feb 05, 1946 Today's Date: 03/04/2019    History of Present Illness 73 y.o. female with hx of hypothyroidism whom presented to the ED 02/20/19 with complaints of abdominal pain,  recently referred to our office as a referral by Dr. Benson Norway for newly diagnosed rectal cancer - near obstructing; s/p expl lap, lower anterior resection and end colostomy on 02/21/19    PT Comments    Pt with improved ambulation distance this session, and presents with steadiness without use of AD during ambulation. Pt was initially nervous without RW during ambulation and had a couple of periods of brief unsteadiness self-recovered, but pt gained confidence with further ambulation. PT continuing to recommend HHPT, as pt has progressed well with mobility and does not require physical assist to perform any mobility at this tim. Pt states this session she feels she is more than capable of d/cing home at this time. PT to continue to follow acutely.    Follow Up Recommendations  Home health PT;Supervision - Intermittent     Equipment Recommendations  Rolling walker with 5" wheels    Recommendations for Other Services       Precautions / Restrictions Precautions Precautions: Fall;Other (comment) Precaution Comments: JP drain in place on R, colostomy Restrictions Weight Bearing Restrictions: No    Mobility  Bed Mobility Overal bed mobility: Needs Assistance Bed Mobility: Supine to Sit;Sit to Supine     Supine to sit: Supervision Sit to supine: Supervision   General bed mobility comments: Supervision for safety. Increased time and use of bedrails/overhead trapeze.   Transfers Overall transfer level: Modified independent   Transfers: Sit to/from [facility] Sit to Stand: Modified independent (Device/Increase time) Stand pivot transfers: Modified independent (Device/Increase time)       General transfer  comment: Mod I for increased time, no physical assist required. Pt with stand pivot to [facility] and sit to stand from [facility] in preparation for ambulation.   Ambulation/Gait Ambulation/Gait assistance: Supervision Gait Distance (Feet): 1350 Feet Assistive device: Rolling walker (2 wheeled);None Gait Pattern/deviations: Step-through pattern;Decreased stride length Gait velocity: decr    General Gait Details: Supervision for safety. Pt insisted on using RW initially for mobility, but pt able to perform ~450 ft ambulation without AD without difficulty. Pt with occasional mild unsteadiness corrected by pt. Pt reports this was due to reflective floor and pt's lack of contacts this session.   Stairs             Wheelchair Mobility    Modified Rankin (Stroke Patients Only)       Balance Overall balance assessment: Needs assistance Sitting-balance support: No upper extremity supported;Feet supported Sitting balance-Leahy Scale: Good     Standing balance support: No upper extremity supported Standing balance-Leahy Scale: Fair                              Cognition Arousal/Alertness: Awake/alert Behavior During Therapy: WFL for tasks assessed/performed Overall Cognitive Status: Within Functional Limits for tasks assessed                                        Exercises      General Comments        Pertinent Vitals/Pain Pain Assessment: No/denies pain    Home Living  Prior Function            PT Goals (current goals can now be found in the care plan section) Acute Rehab PT Goals Patient Stated Goal: get better PT Goal Formulation: With patient Time For Goal Achievement: 03/10/19 Potential to Achieve Goals: Good Progress towards PT goals: Progressing toward goals    Frequency    Min 3X/week      PT Plan Current plan remains appropriate    Co-evaluation              AM-PAC PT "6 Clicks" Mobility    Outcome Measure  Help needed turning from your back to your side while in a flat bed without using bedrails?: A Little Help needed moving from lying on your back to sitting on the side of a flat bed without using bedrails?: A Little Help needed moving to and from a bed to a chair (including a wheelchair)?: None Help needed standing up from a chair using your arms (e.g., wheelchair or bedside chair)?: None Help needed to walk in hospital room?: A Little Help needed climbing 3-5 steps with a railing? : A Little 6 Click Score: 20    End of Session   Activity Tolerance: Patient tolerated treatment well Patient left: in bed;with call bell/phone within reach Nurse Communication: Mobility status PT Visit Diagnosis: Difficulty in walking, not elsewhere classified (R26.2)     Time: 4239-5320 PT Time Calculation (min) (ACUTE ONLY): 25 min  Charges:  $Gait Training: 23-37 mins                     Julien Girt, PT Acute Rehabilitation Services Pager [phone]  Office [phone]    Roxine Caddy D Elonda Husky 03/04/2019, 4:58 PM

## 2019-03-05 MED ORDER — ACETAMINOPHEN 325 MG PO TABS: 650.0000 mg | ORAL_TABLET | Freq: Four times a day (QID) | ORAL | Status: DC | PRN

## 2019-03-05 MED ORDER — OXYCODONE HCL 5 MG PO TABS: 5.0000 mg | ORAL_TABLET | ORAL | 0 refills | Status: DC | PRN

## 2019-03-05 MED ORDER — METHOCARBAMOL 750 MG PO TABS: 750.0000 mg | ORAL_TABLET | Freq: Three times a day (TID) | ORAL | 0 refills | Status: DC | PRN

## 2019-03-05 MED ORDER — AMOXICILLIN-POT CLAVULANATE 875-125 MG PO TABS: 1.0000 | ORAL_TABLET | Freq: Two times a day (BID) | ORAL | 0 refills | Status: AC

## 2019-03-05 MED ORDER — DOCUSATE SODIUM 100 MG PO CAPS: 100.0000 mg | ORAL_CAPSULE | Freq: Every day | ORAL | Status: DC | PRN

## 2019-03-05 MED ORDER — IBUPROFEN 600 MG PO TABS: 600.0000 mg | ORAL_TABLET | Freq: Four times a day (QID) | ORAL | Status: DC | PRN

## 2019-03-05 NOTE — TOC Transition Note (Signed)
Transition of Care [facility]) - CM/SW Discharge Note   Patient Details  Name: GRISELDA BRAMBLETT MRN: [id number] Date of Birth: 29-Jun-1946  Transition of Care [facility]) CM/SW Contact:  Lia Hopping, [place] Phone Number: 03/05/2019, 10:53 AM   Clinical Narrative:    Patient feels she can manage her own care at home. Patient reports she is excited about transitioning home.   Final next level of care: [place] Barriers to Discharge: No Barriers Identified   Patient Goals and CMS Choice Patient states their goals for this hospitalization and ongoing recovery are:: TO GO HOME AND STAY THERE AND GET THIS INCISION HEALED CMS Medicare.gov Compare Post Acute Care list provided to:: Patient Choice offered to / list presented to : Patient  Discharge Placement  [place]                 Name of family member notified: Patient to notify family.  Patient and family notified of of transfer: 03/05/19  Discharge Plan and Services   Discharge Planning Services: CM Consult Post Acute Care Choice: Home Health              HH Arranged: RN, PT [facility] Agency: [place] ([facility]   Social Determinants of Health (SDOH) Interventions     Readmission Risk Interventions No flowsheet data found.

## 2019-03-07 DIAGNOSIS — G894 Chronic pain syndrome: Secondary | ICD-10-CM | POA: Diagnosis not present

## 2019-03-07 DIAGNOSIS — M545 Low back pain: Secondary | ICD-10-CM | POA: Diagnosis not present

## 2019-03-07 DIAGNOSIS — M4856XS Collapsed vertebra, not elsewhere classified, lumbar region, sequela of fracture: Secondary | ICD-10-CM | POA: Diagnosis not present

## 2019-03-07 DIAGNOSIS — G8929 Other chronic pain: Secondary | ICD-10-CM | POA: Diagnosis not present

## 2019-03-07 DIAGNOSIS — M25431 Effusion, right wrist: Secondary | ICD-10-CM | POA: Diagnosis not present

## 2019-03-07 DIAGNOSIS — R002 Palpitations: Secondary | ICD-10-CM | POA: Diagnosis not present

## 2019-03-07 DIAGNOSIS — S22080S Wedge compression fracture of T11-T12 vertebra, sequela: Secondary | ICD-10-CM | POA: Diagnosis not present

## 2019-03-07 DIAGNOSIS — M79604 Pain in right leg: Secondary | ICD-10-CM | POA: Diagnosis not present

## 2019-03-07 DIAGNOSIS — Z483 Aftercare following surgery for neoplasm: Secondary | ICD-10-CM | POA: Diagnosis not present

## 2019-03-07 DIAGNOSIS — C20 Malignant neoplasm of rectum: Secondary | ICD-10-CM | POA: Diagnosis not present

## 2019-03-07 DIAGNOSIS — R82998 Other abnormal findings in urine: Secondary | ICD-10-CM | POA: Diagnosis not present

## 2019-03-07 DIAGNOSIS — Z433 Encounter for attention to colostomy: Secondary | ICD-10-CM | POA: Diagnosis not present

## 2019-03-07 DIAGNOSIS — E782 Mixed hyperlipidemia: Secondary | ICD-10-CM | POA: Diagnosis not present

## 2019-03-07 DIAGNOSIS — R079 Chest pain, unspecified: Secondary | ICD-10-CM | POA: Diagnosis not present

## 2019-03-07 DIAGNOSIS — Z125 Encounter for screening for malignant neoplasm of prostate: Secondary | ICD-10-CM | POA: Diagnosis not present

## 2019-03-07 DIAGNOSIS — D508 Other iron deficiency anemias: Secondary | ICD-10-CM | POA: Diagnosis not present

## 2019-03-07 DIAGNOSIS — S32810S Multiple fractures of pelvis with stable disruption of pelvic ring, sequela: Secondary | ICD-10-CM | POA: Diagnosis not present

## 2019-03-07 DIAGNOSIS — R739 Hyperglycemia, unspecified: Secondary | ICD-10-CM | POA: Diagnosis not present

## 2019-03-07 DIAGNOSIS — E43 Unspecified severe protein-calorie malnutrition: Secondary | ICD-10-CM | POA: Diagnosis not present

## 2019-03-07 DIAGNOSIS — M109 Gout, unspecified: Secondary | ICD-10-CM | POA: Diagnosis not present

## 2019-03-07 DIAGNOSIS — E063 Autoimmune thyroiditis: Secondary | ICD-10-CM | POA: Diagnosis not present

## 2019-03-13 ENCOUNTER — Ambulatory Visit: Payer: PPO | Attending: Radiation Oncology

## 2019-03-13 ENCOUNTER — Institutional Professional Consult (permissible substitution): Payer: PPO | Admitting: Radiation Oncology

## 2019-03-13 ENCOUNTER — Ambulatory Visit: Payer: PPO | Admitting: Hematology

## 2019-03-26 NOTE — Progress Notes (Signed)
GI Location of Tumor / Histology: Rectal Cancer  Pathology returned invasive adenocarcinoma, moderately differentiated spanning 4 cm.  Tumor involves pericolonic soft tissue.  Diverticulitis with abscess also present.  Serositis.  Distal resection margin positive for carcinoma. 0/15 LNs.  pT3/4N0.  Plan: Chemotherapy first, ChemoRT, 28 fractions  Marie Levy presented to the ED with a perforation of her rectal cancer.  She was taken to the OR 02/20/2019 for emergent exploratory laparotomy, low anterior resection, end colostomy.  Patient states that around Thanksgiving she noticed that a few hours after eating she would have diarrhea.  Seen PCP was diagnosed with diverticulitis.  She noted that her stools were mostly loose, any formed stool was in small amounts.  Her symptoms worsened and she saw her PCP who ordered a CT scan.  CT abd/pelvis 02/25/2019: No hepatic masses identified.  Several small hepatic cysts remain stable.  CT abd/pelvis 02/20/2019: Pneumoperitoneum indicating a perforated loop of bowel.  No metastatic disease identified.  CT Chest 02/17/2019: No evidence of metastatic disease in the chest.  Flexible Sigmoidoscopy 02/11/2019: fungating obstructing mass in the rectum that was unable to be traversed with the sigmoidoscope.  CT abd/pelvis 02/07/2019: Wall thickening, somewhat mass like in appearance in some regions.  There is also a region of stricture/narrowing resulting in partial obstruction with fecal loading throughout the remainder of the colon.  Thickening of the gastric antrum just distal to the distension.  Biopsies of Colon 02/20/2019   Past/Anticipated interventions by surgeon, if any:  Dr. [name] 03/18/2019 -Post-op follow up post emergent exlap/lar/end colostomy for perforated mid rectal cancer 02/19/2019 -We discussed that the margin was positive.  We discussed the recommendation for chemoradiation and possible chemotherapy as well. -We discussed potential for  additional surgery down the road following all this but she has declined any further surgery and would like to treat this medically as best she can. -We discussed the potential for recurrence/persistence. -We also discussed that herbal supplements and alternative medical therapies have not been proven to provide any cures of cancer.   Past/Anticipated interventions by medical oncology, if any:  Dr. Burr Medico 03/27/2019 1:45 pm   Weight changes, if any: 10 pounds lost over the last 3 months.  Bowel/Bladder complaints, if any:   Nausea / Vomiting, if any: No  Pain issues, if any: None  Any blood per rectum:     SAFETY ISSUES:  Prior radiation? No  Pacemaker/ICD? No  Possible current pregnancy? Postmenopausal  Is the patient on methotrexate? No  Current Complaints/Details: - ? Genetic testing

## 2019-03-26 NOTE — Progress Notes (Signed)
[place]   Telephone:([phone]) [phone] Fax:([phone]) [place] Note   Patient Care Team: Marie Rasmussen, MD as PCP - General (Family Medicine)  Date of Service:  03/27/2019   CHIEF COMPLAINTS/PURPOSE OF CONSULTATION:  Newly Diagnosed Rectal Cancer  REFERRING PHYSICIAN:  Dr. Benson Levy   Oncology History   Cancer Staging Rectal cancer [facility]) Staging form: Colon and Rectum, AJCC 8th Edition - Pathologic stage from 02/20/2019: Stage IIA (pT3, pN0, cM0) - Signed by Marie Merle, MD on 02/24/2019       Rectal cancer ([place])   02/08/2019 Imaging    CT AP W Contrast 02/08/19  IMPRESSION: 1. The rectum and sigmoid colon are markedly abnormal with wall thickening, somewhat masslike in appearance in some regions. There is also a region of stricture/narrowing resulting in partial obstruction with fecal loading throughout the remainder of the colon. The patient is at risk of developing a high-grade obstruction. The constellation of findings could be due to an underlying malignancy. A benign stricture with a superimposed colitis/proctitis is a possibility. An infectious or inflammatory colitis are also possibilities. Recommend consultation with gastroenterology. The patient will likely require direct visualization to determine the underlying etiology. Again, malignancy is not excluded. 2. Gastric distention. There is wall thickening in the region of the antrum just distal to the distension. Recommend direct visualization or upper GI. 3. No other significant abnormalities.    02/11/2019 Procedure    Flexible Sigmoidoscopy 02/11/19 by Dr. Benson Levy IMPRESSION -a malignant completely obstructing tumor in the recto-sigmoid colon - biopsied.    02/11/2019 Initial Biopsy    Colon, rectosigmoid mass biopsy --Invasive Adenocarcinoma, moderately differentiated    02/17/2019 Imaging    CT Chest W Contrast 02/17/19  IMPRESSION: No evidence of metastatic disease in the chest.     02/20/2019 Imaging    CT CP W Contrast 02/20/19  IMPRESSION: 1. Pneumoperitoneum indicating a perforated loop of bowel. There appears to be a 4 cm extraluminal collection of gas and fluid central in the pelvis anterior to the sigmoid colon (series 2, image 68). This is in the region of distal large bowel tumor as described on 02/07/2019. Critical Value/emergent results were called by telephone at the time of interpretation on 02/20/2019 at 1933 hours to PA-C Marie Levy , who verbally acknowledged these results. 2. Small volume of free fluid and free air scattered in the bilateral abdomen. Generalized mesenteric inflammation. 3. No metastatic disease identified.    02/20/2019 Surgery    EXPLORATORY LAPAROTOMY with LOWER ANTERIOR RESECTION WITH COLOSTOMY by Dr. Dema Levy  02/20/19     02/20/2019 Cancer Staging    Staging form: Colon and Rectum, AJCC 8th Edition - Pathologic stage from 02/20/2019: Stage IIA (pT3, pN0, cM0) - Signed by Marie Merle, MD on 02/24/2019    02/20/2019 Pathology Results    Surgical Pathology  Diagnosis Colon, segmental resection for tumor, recto-sigmoid colon - INVASIVE ADENOCARCINOMA, MODERATELY DIFFERENTIATED, SPANNING 4 CM. - TUMOR INVOLVES PERICOLONIC SOFT TISSUE. - DIVERTICULITIS WITH ABSCESS. - SEROSITIS. - DISTAL RESECTION MARGIN IS POSITIVE FOR CARCINOMA. - FIFTEEN OF FIFTEEN LYMPH NODES NEGATIVE FOR CARCINOMA (0/15). - SEE ONCOLOGY TABLE.     02/24/2019 Initial Diagnosis    Rectal cancer ([place])    02/25/2019 Imaging    CT AP 02/25/19  IMPRESSION: 1. Postop changes from low anterior resection with left lower quadrant colostomy. Postop ileus. No evidence of abscess or bowel obstruction. 2. Increased diffuse body wall and mesenteric edema and small bilateral pleural effusions,  consistent with 3rd spacing.      HISTORY OF PRESENTING ILLNESS:  Marie Levy 73 y.o. female is a here because of newly diagnosed rectal cancer. The patient was referred  by Dr. Benson Levy. The patient presents to the clinic today by herself.   She notes around 2019 thanksgiving is that about 2 hours after she ate she would have diarrhea. This gradually got worse. She saw her PCP about this and she was diagnosed with microscopic colitis although pt felt this was not accurate based on her healthy diet. PCP treated her and this improved her diarrhea, but was still persistent. To avoid diarrhea this altered when she ate. She only had bleeding on 2 episode. She tried to alter her diet and lifestyle to manage this. She ended up on a clear liquid diet and lost about 26 pounds.  In December 2019 she was suppose to have CT scan with PCP but was not done until March 05/2019. The scan showed a mass. She saw Dr. Benson Levy for sigmoidoscopy which should large obstruction. She had significant pain before her surgery. She underwent Surgery for removal by Dr. Dema Levy. She will f/u with him in 05/2019.  Pt notes her last colonoscopy was 20 years ago.   She had a WEBEX with Dr. Lisbeth Levy this morning. She had antibiotics for 2 weeks after surgery. She notes her incision is still healing. Since surgery she was able to eat much better and appetite has been adequate with help of protein smoothing. She has been gaining weight since surgery. Her baseline weight was 145lbs. She did not go to rehab and proceeded to go home. She has recovered well from surgery and has been able to manage her colostomy. She had 1 home health nurse and a PT who have both released her from their care. She notes on the right lower side of ostomy incision she has soreness. She also notes tightness of her mid-abdomen occasionally. She feels she is doing well. She feels her energy level is not back to baseline but it is improving daily, now 60%. She has been doing some walking with stairs. She denies chest discomfort, SOB or cough or leg pain. She notes she has health Team advantage through [facility].   Socially she has 2 children and had a h/o of  1 miscarriage. She is currently single and lives by herself. She has a son who lives in [place]. She drinks rarely, wine. 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 They have a PMHx of hyperthyroidism and post radiation she had hypothyroidism. She takes thyroid replacements. She also takes supplements. She has a PCP yearly. She stopped having mammogram at 73 yo.  She notes her mother passed from colon cancer, her maternal grandfather had cancer, 2 maternal uncle had cancer. Her father had melanoma. Her brother died from lung cancer.    REVIEW OF SYSTEMS:    Constitutional: Denies fevers, chills or abnormal night sweats Eyes: Denies blurriness of vision, double vision or watery eyes Ears, nose, mouth, throat, and face: Denies mucositis or sore throat Respiratory: Denies cough, dyspnea or wheezes Cardiovascular: Denies palpitation, chest discomfort or lower extremity swelling Gastrointestinal:  Denies nausea, heartburn or change in bowel habits (+) occasional tightness of mid abdomen (+) Soreness of right lower edge of incision (+) Incision still healing  Skin: Denies abnormal skin rashes Lymphatics: Denies new lymphadenopathy or easy bruising Neurological:Denies numbness, tingling or new weaknesses Behavioral/Psych: Mood is stable, no new changes  All other  systems were reviewed with the patient and are negative.   MEDICAL HISTORY:  Past Medical History:  Diagnosis Date   Rectal cancer ([place])    Thyroid disease     SURGICAL HISTORY: Past Surgical History:  Procedure Laterality Date   COLECTOMY WITH COLOSTOMY CREATION/HARTMANN PROCEDURE N/A 02/20/2019   Procedure: LOWER ANTERIOR RESECTION WITH COLOSTOMY;  Surgeon: [name], MD;  Location: [facility];  Service: General;  Laterality: N/A;   LAPAROTOMY N/A 02/20/2019   Procedure: EXPLORATORY LAPAROTOMY;  Surgeon: [name], MD;  Location: [facility];  Service: General;  Laterality: N/A;    TONSILLECTOMY      SOCIAL HISTORY: Social History   Socioeconomic History   Marital status: Single    Spouse name: Not on file   Number of children: 2   Years of education: Not on file   Highest education level: Not on file  Occupational History   Not on file  Social Needs   Financial resource strain: Not on file   Food insecurity:    Worry: Not on file    Inability: Not on file   Transportation needs:    Medical: No    Non-medical: No  Tobacco Use   Smoking status: Never Smoker   Smokeless tobacco: Never Used  Substance and Sexual Activity   Alcohol use: Not Currently   Drug use: Never   Sexual activity: Not on file  Lifestyle   Physical activity:    Days per week: Not on file    Minutes per session: Not on file   Stress: Not on file  Relationships   Social connections:    Talks on phone: Not on file    Gets together: Not on file    Attends religious service: Not on file    Active member of club or organization: Not on file    Attends meetings of clubs or organizations: Not on file    Relationship status: Not on file   Intimate partner violence:    Fear of current or ex partner: Not on file    Emotionally abused: Not on file    Physically abused: Not on file    Forced sexual activity: Not on file  Other Topics Concern   Not on file  Social History Narrative   Not on file    FAMILY HISTORY: Family History  Problem Relation Age of Onset   Colon cancer Mother 71   Melanoma Father    Cancer Brother        lung cancer    Cancer Maternal Uncle        unknown type cancer    Cancer Maternal Grandfather        unknown type cancer    Cancer Maternal Uncle        unknown type cancer     ALLERGIES:  has No Known Allergies.  MEDICATIONS:  Current Outpatient Medications  Medication Sig Dispense Refill   Ascorbic Acid (VITAMIN C) 100 MG tablet Take 100 mg by mouth daily.     Digestive Enzyme CAPS Take 1 capsule by mouth daily.       Flaxseed, Linseed, (FLAXSEED OIL PO) Take by mouth.     magnesium 30 MG tablet Take 500 mg by mouth daily.      NP THYROID 90 MG tablet Take 90 mg by mouth daily. Mon-Fri     polyethylene glycol (MIRALAX / GLYCOLAX) 17 g packet Take 17 g by mouth daily.     Selenium 100  MCG CAPS Take 1 capsule by mouth daily.     UNABLE TO FIND daily. Med Name: Tumero     UNABLE TO FIND Take by mouth daily. Med Name: Aseo     UNABLE TO FIND Take by mouth daily. Med Name: HCL     vitamin B-12 (CYANOCOBALAMIN) 500 MCG tablet Take 500 mcg by mouth daily.     VITAMIN D PO Take 30 mLs by mouth daily.     methocarbamol (ROBAXIN) 750 MG tablet Take 1 tablet (750 mg total) by mouth every 8 (eight) hours as needed for muscle spasms. (Patient not taking: Reported on 03/27/2019) 30 tablet 0   oxyCODONE (OXY IR/ROXICODONE) 5 MG immediate release tablet Take 1-2 tablets (5-10 mg total) by mouth every 4 (four) hours as needed for moderate pain or severe pain. (Patient not taking: Reported on 03/27/2019) 20 tablet 0   No current facility-administered medications for this visit.     PHYSICAL EXAMINATION: ECOG PERFORMANCE STATUS: 2 - Symptomatic, <50% confined to bed  Vitals:   03/27/19 1319  BP: 128/81  Pulse: (!) 107  Resp: 17  Temp: 98.3 F (36.8 C)  SpO2: 100%   Filed Weights   03/27/19 1319  Weight: 122 lb (55.3 kg)    GENERAL:alert, no distress and comfortable SKIN: skin color, texture, turgor are normal, no rashes or significant lesions EYES: normal, conjunctiva are pink and non-injected, sclera clear OROPHARYNX:no exudate, no erythema and lips, buccal mucosa, and tongue normal  NECK: supple, thyroid normal size, non-tender, without nodularity LYMPH:  no palpable lymphadenopathy in the cervical, axillary or inguinal LUNGS: clear to auscultation and percussion with normal breathing effort HEART: regular rate & rhythm and no murmurs and no lower extremity edema ABDOMEN:abdomen soft,  non-tender and normal bowel sounds (+) midline surgical incision healing well, with 6-7cm open wound, currently covered in dressing (+) mild RLQ tenderness (+) Colostomy bag in place  Musculoskeletal:no cyanosis of digits and no clubbing  PSYCH: alert & oriented x 3 with fluent speech NEURO: no focal motor/sensory deficits  LABORATORY DATA:  I have reviewed the data as listed CBC Latest Ref Rng & Units 03/04/2019 03/03/2019 03/01/2019  WBC 4.0 - 10.5 K/uL 12.5(H) 13.6(H) 20.6(H)  Hemoglobin 12.0 - 15.0 g/dL 9.8(L) 9.6(L) 9.2(L)  Hematocrit 36.0 - 46.0 % 31.3(L) 32.7(L) 31.6(L)  Platelets 150 - 400 K/uL 624(H) 588(H) 631(H)    CMP Latest Ref Rng & Units 03/04/2019 03/03/2019 03/01/2019  Glucose 70 - 99 mg/dL 121(H) 86 72  BUN 8 - 23 mg/dL 8 9 14   Creatinine 0.44 - 1.00 mg/dL 0.63 0.53 0.66  Sodium 135 - 145 mmol/L 129(L) 130(L) 129(L)  Potassium 3.5 - 5.1 mmol/L 3.6 3.6 3.8  Chloride 98 - 111 mmol/L 97(L) 97(L) 95(L)  CO2 22 - 32 mmol/L 22 23 24   Calcium 8.9 - 10.3 mg/dL 7.7(L) 7.8(L) 7.7(L)  Total Protein 6.5 - 8.1 g/dL - - -  Total Bilirubin 0.3 - 1.2 mg/dL - - -  Alkaline Phos 38 - 126 U/L - - -  AST 15 - 41 U/L - - -  ALT 0 - 44 U/L - - -     RADIOGRAPHIC STUDIES: I have personally reviewed the radiological images as listed and agreed with the findings in the report. No results found.  ASSESSMENT & PLAN:  Marie Levy is a 73 y.o. Caucasian female with a history of hyperthyroidism (s/p radiation), hypothyroidism on thyroid replacement.   1. 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 Normal Baseline CEA 2.5 (02/11/19) -She underwent urgent surgical resection on 02/20/19 due to bowel perforation. Pathology shows moderately differentiated adenomacarcinoma, grade II, positive distal margins, 15 nodes were all negative. Her tumor was also found to  be perforated.  -I discussed given her grade II disease, positive margins and perforation, she is at high risk of local and distant recurrence, especially peritoneal metastasis -Dr. Dema Levy had offered second surgery for positive margin, patient declined. -To reduce her risk of recurrence, we recommend adjuvant chemo and radiation.  Given her pathology stage II disease, and her advanced I recommend Xeloda, patient is also not interested in intravenous chemo, but willing to consider oral chemo Xeloda.  Due to the tumor perforation, I recommend Xeloda for 6 months, including Xeloda with concurrent radiation.  Due to the current COVID-19 pandemic, and her wound healing issue, adjuvant radiation will likely be postponed for 1 to 2 months. She saw Dr. Lisbeth Levy this morning. However she is reluctant to take radiation -I plan to see her back in 3 to 4 weeks, if her abdominal incision wound is healing well and her PS improves further, plan to start her on Xeloda in 5 weeks.  --Chemotherapy consent: Side effects including but does not not limited to, fatigue, nausea, vomiting, diarrhea, mild hair loss, neuropathy, fluid retention, renal and kidney dysfunction, neutropenic fever, needed for blood transfusion, skin toxicity in palms and bottom foot, were discussed with patient in great detail. She agrees to proceed. -the goal of therapy is curative, although she has high risk for recurrence  --Will f/u with her in 3 weeks to monitor wound healing and possible start Xeloda.    2. Weight loss  -She was 145 at baseline.  -She lost about 20-30 pounds since onset of initial symptoms and liquid diet -Since surgery she has been able to eat better and has started to gain weight.  -She takes protein shakes. I encouraged her to continue and increase calorie and protein in diet.   3. Anemia, likely iron deficiency -secondary to #1 -Hg has dropped since surgery. Last labs from 03/04/19 show Hg at 9.8, MCV 76.3 and PLT 624,  which supports iron deficiency.  -I recommend she take ferrous sulfate 65mg  1-2 tabs a day. I discussed this can cause constipation. I suggest she use OTC Colase.  -will check iron panel at next visit     PLAN:  -Lab and f/u in 3-4 weeks, plan to start Xeloda after next visit -I will call in Xeloda 1500mg  q12h, on day 1-14 every 21 days, to [place] today    No orders of the defined types were placed in this encounter.   All questions were answered. The patient knows to call the clinic with any problems, questions or concerns. I spent 50 minutes counseling the patient face to face. The total time spent in the appointment was 60 minutes and more than 50% was on counseling.     Marie Merle, MD 03/27/2019 5:45 PM  I, Joslyn Devon, am acting as scribe for Marie Merle, MD.   I have reviewed the above documentation for accuracy and completeness, and I agree with the above.

## 2019-03-27 ENCOUNTER — Encounter: Payer: Self-pay | Admitting: Radiation Oncology

## 2019-03-27 ENCOUNTER — Other Ambulatory Visit: Payer: Self-pay

## 2019-03-27 ENCOUNTER — Ambulatory Visit
Admission: RE | Admit: 2019-03-27 | Discharge: 2019-03-27 | Disposition: A | Discharge status: Home / Self Care | Payer: PPO | Source: Ambulatory Visit | Attending: Radiation Oncology | Admitting: Radiation Oncology

## 2019-03-27 ENCOUNTER — Encounter: Payer: Self-pay | Admitting: Hematology

## 2019-03-27 ENCOUNTER — Telehealth: Payer: Self-pay

## 2019-03-27 ENCOUNTER — Telehealth: Payer: Self-pay | Admitting: Hematology

## 2019-03-27 ENCOUNTER — Inpatient Hospital Stay: Payer: PPO | Attending: Hematology | Admitting: Hematology

## 2019-03-27 ENCOUNTER — Ambulatory Visit: Payer: PPO | Admitting: Hematology

## 2019-03-27 VITALS — BP 128/81 | HR 107 | Temp 98.3°F | Resp 17 | Ht 66.0 in | Wt 122.0 lb

## 2019-03-27 DIAGNOSIS — R634 Abnormal weight loss: Secondary | ICD-10-CM

## 2019-03-27 DIAGNOSIS — E0590 Thyrotoxicosis, unspecified without thyrotoxic crisis or storm: Secondary | ICD-10-CM | POA: Insufficient documentation

## 2019-03-27 DIAGNOSIS — C19 Malignant neoplasm of rectosigmoid junction: Secondary | ICD-10-CM

## 2019-03-27 DIAGNOSIS — Z808 Family history of malignant neoplasm of other organs or systems: Secondary | ICD-10-CM | POA: Diagnosis not present

## 2019-03-27 DIAGNOSIS — E039 Hypothyroidism, unspecified: Secondary | ICD-10-CM | POA: Insufficient documentation

## 2019-03-27 DIAGNOSIS — Z933 Colostomy status: Secondary | ICD-10-CM

## 2019-03-27 DIAGNOSIS — Z801 Family history of malignant neoplasm of trachea, bronchus and lung: Secondary | ICD-10-CM | POA: Insufficient documentation

## 2019-03-27 DIAGNOSIS — J90 Pleural effusion, not elsewhere classified: Secondary | ICD-10-CM | POA: Insufficient documentation

## 2019-03-27 DIAGNOSIS — C20 Malignant neoplasm of rectum: Secondary | ICD-10-CM

## 2019-03-27 DIAGNOSIS — Z79899 Other long term (current) drug therapy: Secondary | ICD-10-CM | POA: Diagnosis not present

## 2019-03-27 DIAGNOSIS — Z923 Personal history of irradiation: Secondary | ICD-10-CM | POA: Diagnosis not present

## 2019-03-27 DIAGNOSIS — Z809 Family history of malignant neoplasm, unspecified: Secondary | ICD-10-CM | POA: Diagnosis not present

## 2019-03-27 DIAGNOSIS — D649 Anemia, unspecified: Secondary | ICD-10-CM | POA: Diagnosis not present

## 2019-03-27 DIAGNOSIS — K5780 Diverticulitis of intestine, part unspecified, with perforation and abscess without bleeding: Secondary | ICD-10-CM

## 2019-03-27 DIAGNOSIS — Z800 Family history of malignant neoplasm of digestive organs: Secondary | ICD-10-CM | POA: Insufficient documentation

## 2019-03-27 HISTORY — DX: Malignant neoplasm of rectum: C20

## 2019-03-27 MED ORDER — CAPECITABINE 500 MG PO TABS: 1000.0000 mg/m2 | ORAL_TABLET | Freq: Two times a day (BID) | ORAL | 3 refills | Status: DC

## 2019-03-27 NOTE — Telephone Encounter (Signed)
Opened in error

## 2019-03-27 NOTE — Telephone Encounter (Signed)
Called and scheduled appt per 4/23 los.  Left a detailed message about scheduled appts.

## 2019-03-27 NOTE — Progress Notes (Signed)
Radiation Oncology         [phone] ________________________________  Name: Marie Levy        MRN: [id number]  Date of Service: 03/27/2019 DOB: 07/12/1946  WF:UXNATFT, Maebelle Munroe, MD  [name], MD     REFERRING PHYSICIAN: [name], MD   DIAGNOSIS: The encounter diagnosis was Rectal cancer [facility]).   HISTORY OF PRESENT ILLNESS: Marie Levy is a 73 y.o. female seen at the request of Dr. [name] for a newly diagnosed rectal cancer. She noticed GI symptoms and had been treated for colitis. She presented to Dr. Benson Norway for evaluation as her symptoms persisted and sigmoidoscopy on 02/11/2019 revealed a fungating, near obstructing mass in the rectum and this was biopsied. The biopsies were consistent with invasive adenocarcinoma and her CEA was 2.5. CT imaging on 02/07/2019  Of the abdomen and pelvis revealed thickening and mass like appearence of the sigmoid colon. A CT chest on 02/17/2019 was negative for disease. She presented for emergent evaluation on 02/20/2019 due to increasing abdominal pain.  CT imaging revealed pneumoperitoneum and concern for perforation. She was taken emergently to the OR to resect the perforated mid rectal cancer and underwent emergent exlap/LAR/end colostomy. Final pathology revealed pT3N0 disease. The distal margin was positive, and given the perforation and margin status, she's seen for evaluation. She is scheduled to see Dr. Burr Medico later today.    PREVIOUS RADIATION THERAPY: No   PAST MEDICAL HISTORY:  Past Medical History:  Diagnosis Date  . Rectal cancer ([place])        PAST SURGICAL HISTORY: Past Surgical History:  Procedure Laterality Date  . COLECTOMY WITH COLOSTOMY CREATION/HARTMANN PROCEDURE N/A 02/20/2019   Procedure: LOWER ANTERIOR RESECTION WITH COLOSTOMY;  Surgeon: [name], MD;  Location: [facility];  Service: General;  Laterality: N/A;  . LAPAROTOMY N/A 02/20/2019   Procedure: EXPLORATORY LAPAROTOMY;  Surgeon: [name], MD;  Location: [facility];  Service: General;  Laterality: N/A;  . TONSILLECTOMY       FAMILY HISTORY:  Family History  Problem Relation Age of Onset  . Colon cancer Mother   . Melanoma Father      SOCIAL HISTORY:  reports that she has never smoked. She has never used smokeless tobacco. She reports previous alcohol use. She reports that she does not use drugs.   ALLERGIES: Patient has no known allergies.   MEDICATIONS:  Current Outpatient Medications  Medication Sig Dispense Refill  . acetaminophen (TYLENOL) 325 MG tablet Take 2 tablets (650 mg total) by mouth every 6 (six) hours as needed for mild pain or fever.    . Ascorbic Acid (VITAMIN C) 100 MG tablet Take 100 mg by mouth daily.    . Digestive Enzyme CAPS Take 1 capsule by mouth daily.    Marland Kitchen docusate sodium (COLACE) 100 MG capsule Take 1 capsule (100 mg total) by mouth daily as needed for mild constipation. 10 capsule   . ibuprofen (ADVIL,MOTRIN) 600 MG tablet Take 1 tablet (600 mg total) by mouth every 6 (six) hours as needed for mild pain (mild pain not relieved by tylenol). 30 tablet   . magnesium 30 MG tablet Take 30 mg by mouth 2 (two) times daily.    . methocarbamol (ROBAXIN) 750 MG tablet Take 1 tablet (750 mg total) by mouth every 8 (eight) hours as needed for muscle spasms. 30 tablet 0  . NP THYROID 90 MG tablet Take 90 mg by mouth daily. Mon-Fri    .  oxyCODONE (OXY IR/ROXICODONE) 5 MG immediate release tablet Take 1-2 tablets (5-10 mg total) by mouth every 4 (four) hours as needed for moderate pain or severe pain. 20 tablet 0  . Selenium 100 MCG CAPS Take 1 capsule by mouth daily.    Marland Kitchen VITAMIN D PO Take 30 mLs by mouth daily.     No current facility-administered medications for this encounter.      REVIEW OF SYSTEMS: On review of systems, the patient reports that she is doing well overall. She's recovering well since surgery and managing her colostomy. She denies any chest pain, shortness of breath,  cough, fevers, chills, night sweats, unintended weight changes. She denies any bladder disturbances, and denies abdominal pain, nausea or vomiting. She denies any new musculoskeletal or joint aches or pains. A complete review of systems is obtained and is otherwise negative.     PHYSICAL EXAM:  Wt Readings from Last 3 Encounters:  02/20/19 124 lb (56.2 kg)   Temp Readings from Last 3 Encounters:  03/05/19 98.5 F (36.9 C) (Oral)   BP Readings from Last 3 Encounters:  03/05/19 118/76   Pulse Readings from Last 3 Encounters:  03/05/19 81   Pain Assessment Pain Score: 0-No pain/10  In general this is a well appearing caucasian female in no acute distress. She's alert and oriented x4 and appropriate throughout the examination. Cardiopulmonary assessment is negative for acute distress and she exhibits normal effort.    ECOG = 0  0 - Asymptomatic (Fully active, able to carry on all predisease activities without restriction)  1 - Symptomatic but completely ambulatory (Restricted in physically strenuous activity but ambulatory and able to carry out work of a light or sedentary nature. For example, light housework, office work)  2 - Symptomatic, <50% in bed during the day (Ambulatory and capable of all self care but unable to carry out any work activities. Up and about more than 50% of waking hours)  3 - Symptomatic, >50% in bed, but not bedbound (Capable of only limited self-care, confined to bed or chair 50% or more of waking hours)  4 - Bedbound (Completely disabled. Cannot carry on any self-care. Totally confined to bed or chair)  5 - Death   Eustace Pen MM, Creech RH, Tormey DC, et al. [phone]). "Toxicity and response criteria of the [facility]". [place] Oncol. 5 (6): 649-55    LABORATORY DATA:  Lab Results  Component Value Date   WBC 12.5 (H) 03/04/2019   HGB 9.8 (L) 03/04/2019   HCT 31.3 (L) 03/04/2019   MCV 76.3 (L) 03/04/2019   PLT 624 (H)  03/04/2019   Lab Results  Component Value Date   NA 129 (L) 03/04/2019   K 3.6 03/04/2019   CL 97 (L) 03/04/2019   CO2 22 03/04/2019   Lab Results  Component Value Date   ALT 12 02/24/2019   AST 14 (L) 02/24/2019   ALKPHOS 71 02/24/2019   BILITOT 0.5 02/24/2019      RADIOGRAPHY: Ct Abdomen Pelvis W Contrast  Result Date: 02/25/2019 CLINICAL DATA:  Abdominal pain and fever. Perforated rectal carcinoma. 5 days status post low anterior resection with colostomy. EXAM: CT ABDOMEN AND PELVIS WITH CONTRAST TECHNIQUE: Multidetector CT imaging of the abdomen and pelvis was performed using the standard protocol following bolus administration of intravenous contrast. CONTRAST:  144mL OMNIPAQUE IOHEXOL 300 MG/ML SOLN, 1mL OMNIPAQUE IOHEXOL 300 MG/ML SOLN COMPARISON:  02/20/2019 FINDINGS: Lower Chest: Increased dependent bibasilar atelectasis and new small  bilateral pleural effusions. Hepatobiliary: No hepatic masses identified. Several small hepatic cysts remain stable. Gallbladder is distended but otherwise unremarkable in appearance. No evidence of biliary ductal dilatation. Pancreas:  No mass or inflammatory changes. Spleen: Within normal limits in size and appearance. Adrenals/Urinary Tract: No masses identified. No evidence of hydronephrosis. Small amount of gas in the urinary bladder is most likely due to recent instrumentation. Stomach/Bowel: Postop changes are seen from low anterior resection with left lower quadrant colostomy since prior exam. Surgical drain is seen within the pelvic cul-de-sac. Generalized gaseous distention of small bowel and colon is consistent with postop ileus. No focal inflammatory process abscess identified. Vascular/Lymphatic: No pathologically enlarged lymph nodes. No abdominal aortic aneurysm. Reproductive:  No mass or other significant abnormality. Other:  Increased diffuse body wall and mesenteric edema. Musculoskeletal:  No suspicious bone lesions identified.  IMPRESSION: 1. Postop changes from low anterior resection with left lower quadrant colostomy. Postop ileus. No evidence of abscess or bowel obstruction. 2. 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 Electronically Signed   By: Earle Gell M.D.   On: 02/25/2019 20:07       IMPRESSION/PLAN: 1. Stage IIA, pT3N0M0 adenocarcinoma of the rectum.  Dr. [name] discusses the pathology findings and reviews the nature of rectal cancer. Given the perforation and positive margin, she is at risk of local recurrence. We reviewed the rationale to consider chemotherapy and chemoRT. She is seeing Dr. Burr Medico later today to discuss chemotherapy. Given the current covid pandemic, Dr. [name] would like her to proceed with chemotherapy first followed by chemoRT. We discussed the risks, benefits, short, and long term effects of radiotherapy, and the patient is interested in proceeding. Dr. [name] discusses the delivery and logistics of radiotherapy and anticipates a course of 5 1/2 weeks of radiotherapy. We will follow up with Dr. Burr Medico to determine the appropriate time for initiating radiotherapy.    This encounter was provided by telemedicine platform Webex.  The patient has given verbal consent for this type of encounter and has been advised to only accept a meeting of this type in a secure network environment. The time spent during this encounter was 45 minutes. The attendants for this meeting include Blenda Nicely, RN, Dr. [name], Hayden Pedro  and Hessie Dibble.  During the encounter,  Blenda Nicely, RN, Dr. [name], and Hayden Pedro were located at [facility] Radiation Oncology Department.  Marie Levy was located at home.    The above documentation reflects my direct findings during this shared patient visit. Please see the separate note by Dr. [name] on this date for the remainder of the patient's plan of care.     Carola Rhine, PAC

## 2019-03-28 ENCOUNTER — Telehealth: Payer: Self-pay | Admitting: Pharmacist

## 2019-03-28 DIAGNOSIS — C20 Malignant neoplasm of rectum: Secondary | ICD-10-CM

## 2019-03-28 MED ORDER — CAPECITABINE 500 MG PO TABS: 1000.0000 mg/m2 | ORAL_TABLET | Freq: Two times a day (BID) | ORAL | 3 refills | Status: DC

## 2019-03-28 NOTE — Telephone Encounter (Signed)
Oral Oncology Pharmacist Encounter  Received new prescription for Xeloda (capecitabine) for the adjuvant treatment of stage IIA rectal cancer in conjunction with radiation, planned duration 6 total months of Xeloda.  Patient underwent surgical resection in March 2020 for obstructing mass that has perforated. Surgical margins were positive. Due to perforation and positive margins at resection, patient is a relatively high risk of recurrence. Patient will start with Xeloda monotherapy and at some point will change to concurrent chemoradiation with Xeloda.  Xeloda is planned to be dosed at ~940 mg/m2 by mouth twice daily for 14 day on, 7 days off, repeated every 21 days. Plan to start Xeloda after next office visit scheduled for 04/17/19  Labs from [place] assessed, [place] for treatment. SCr WNL, round to 1.0 for age > 38, est CrCl ~ 45 mL/min, likely underestimated due to low weight  Current medication list in Epic reviewed, no DDIs with Xeloda identified.  Prescription has been e-scribed to the [facility] for benefits analysis and approval by MD on 4/23. Rx resent today with updated sig reflecting cycle days.  Oral Oncology Clinic will continue to follow for insurance authorization, copayment issues, initial counseling and start date.  Johny Drilling, PharmD, BCPS, BCOP  03/28/2019 8:37 AM Oral Oncology Clinic [phone]

## 2019-04-02 MED ORDER — CAPECITABINE 500 MG PO TABS: 1000.0000 mg/m2 | ORAL_TABLET | Freq: Two times a day (BID) | ORAL | 3 refills | Status: DC

## 2019-04-02 NOTE — Telephone Encounter (Signed)
Oral Oncology Pharmacist Encounter  Insurance authorization for Xeloda is not required at this time. Test claim at the pharmacy revealed copayment $346.57 for 1st fill of 84 tablets (3 week supply). This is not affordable to patient every 3 weeks.  Xeloda prescription has been e-scribed to [facility] in [place], [place], as this pharmacy has an in-house patient assistance program that can help cover out of pocket expenses for specialty medications is patient qualifies financially.  We will follow-up with dispensing pharmacy in the next 24 hours for status check of Xeloda and to ensure pharmacy has everything they need to dispense medication.  Details of medication acquisition will be discussed and initial counseling will be performed with the patient at that time  Johny Drilling, PharmD, BCPS, BCOP  04/02/2019 8:43 AM [facility] [phone]

## 2019-04-02 NOTE — Telephone Encounter (Addendum)
Oral Oncology Patient Advocate Encounter  I received a fax from [facility], they are currently working on the benefit investigation and will update when they have more information.  [facility] requested Demographics, Insurance info, and updated notes for the patient. I faxed this today.  [place] Patient [place] Phone [phone] Fax [phone] 04/02/2019   10:15 AM

## 2019-04-02 NOTE — Telephone Encounter (Signed)
Oral Oncology Pharmacist Encounter  Received call from Collie Siad at [facility] that they are able to process patient's claim and will be reaching out to patient to screen for financial assistance. I informed pharmacy that Xeloda start date in mid-May.  I called and left voicemail for patient to expect call from dispensing pharmacy. I also left information that I would be following up with patient prior to start date to discuss initial counseling for Xeloda. Direct dial to oral oncology clinic left in message.  I will continue to follow up prescription status and check in on patient for Xeloda initiation.  Johny Drilling, PharmD, BCPS, BCOP  04/02/2019 3:35 PM Oral Oncology Clinic [phone]

## 2019-04-03 NOTE — Telephone Encounter (Signed)
Oral Oncology Patient Advocate Encounter  I called [facility] pharmacy to follow up on the financial assistance for the patients Xeloda.  The patient has been approved to receive financial assistance with [employer] and the Xeloda is scheduled to ship to the patients home on 04/10/19.  [place] Patient [place] Phone [phone] Fax [phone] 04/03/2019   2:22 PM

## 2019-04-09 DIAGNOSIS — Z933 Colostomy status: Secondary | ICD-10-CM | POA: Diagnosis not present

## 2019-04-09 NOTE — Telephone Encounter (Signed)
Oral Oncology Pharmacist Encounter  I spoke with patient to provide update and check in about Xeloda (capecitabine) new start. I will plan to follow-up with patient after office visit on 5/14, when actual start date is determined, to perform more detailed initial counseling session.  1st shipment will arrive on 5/8 from [facility] via [employer], and patient will need to sign for it. She knows to wait to start it until directed by Dr. Burr Medico.  We briefly discussed indication for adjuvant Xeloda and how patient's dose was calculated. We discussed intended length of therapy and dose cycling during her 6 month course.  Patient states her incision is not yet completely healed, however, it is healing well with no concerns for infection. Patient states that she has gained a few pounds over the past few weeks and is feeling well.  We discussed that Xeloda is billed through her Part B medical benefits and she may meet her out-of-pocket at some point, and her Xeloda copayment may go down to $0 per fill.  Her prescription is currently being filled at [facility] due to their in-house assistance program to cover her out-of-pocket expenses.  We will plan to move her prescription back to [facility] as soon as her copayments go to $0.  Patient also with request to have her "thyroid checked" when she come in for labs on 5/14 as she feels like her replacement may be too much as she has lost a considerable amount of weight since her diagnosis. Message sent to MD in reference to this request.  Johny Drilling, PharmD, BCPS, BCOP  04/09/2019 12:06 PM Oral Oncology Clinic [phone]

## 2019-04-10 ENCOUNTER — Telehealth: Payer: Self-pay

## 2019-04-10 NOTE — Telephone Encounter (Signed)
Oral Oncology Patient Advocate Encounter  Confirmed with [facility] specialty pharmacy that Xeloda shipped 04/10/19 to be delivered 04/11/19 with a $0 copay.   [place] Patient [place] Phone [phone] Fax [phone] 04/10/2019   1:15 PM

## 2019-04-10 NOTE — Telephone Encounter (Signed)
Oral Oncology Patient Advocate Encounter   Was successful in securing patient an $16 grant from Patient [facility]) to provide copayment coverage for Xeloda.  This will keep the out of pocket expense at $0.     I have spoken with the patient.    The billing information is as follows and has been shared with [facility].   Member ID: [id number] Group ID: [id number] RxBin: [id number] Dates of Eligibility: 01/10/19 through 04/08/20  Patient will receive the initial fill from [facility] tomorrow, 04/11/19. Future fills will come from [facility] using this grant to cover the copay.  [place] Patient [place] Phone [phone] Fax [phone] 04/10/2019    3:32 PM

## 2019-04-14 ENCOUNTER — Other Ambulatory Visit: Payer: Self-pay | Admitting: Hematology

## 2019-04-14 DIAGNOSIS — E039 Hypothyroidism, unspecified: Secondary | ICD-10-CM

## 2019-04-14 NOTE — Progress Notes (Signed)
[place]   Telephone:([phone]) [phone] [phone]   Clinic Follow up Note   Patient Care Team: [name], MD as PCP - General (Family Medicine) [name], MD as Consulting Physician (General Surgery) [name], MD as Consulting Physician (Radiation Oncology) Marie Merle, MD as Consulting Physician (Hematology)  Date of Service:  04/17/2019  CHIEF COMPLAINT: f/u of rectal cancer  SUMMARY OF ONCOLOGIC HISTORY: Oncology History   Cancer Staging Rectal cancer [facility]) Staging form: Colon and Rectum, AJCC 8th Edition - Pathologic stage from 02/20/2019: Stage IIA (pT3, pN0, cM0) - Signed by Marie Merle, MD on 02/24/2019       Rectal cancer ([place])   02/08/2019 Imaging    CT AP W Contrast 02/08/19  IMPRESSION: 1. The rectum and sigmoid colon are markedly abnormal with wall thickening, somewhat masslike in appearance in some regions. There is also a region of stricture/narrowing resulting in partial obstruction with fecal loading throughout the remainder of the colon. The patient is at risk of developing a high-grade obstruction. The constellation of findings could be due to an underlying malignancy. A benign stricture with a superimposed colitis/proctitis is a possibility. An infectious or inflammatory colitis are also possibilities. Recommend consultation with gastroenterology. The patient will likely require direct visualization to determine the underlying etiology. Again, malignancy is not excluded. 2. Gastric distention. There is wall thickening in the region of the antrum just distal to the distension. Recommend direct visualization or upper GI. 3. No other significant abnormalities.    02/11/2019 Procedure    Flexible Sigmoidoscopy 02/11/19 by Dr. Benson Norway IMPRESSION -a malignant completely obstructing tumor in the recto-sigmoid colon - biopsied.    02/11/2019 Initial Biopsy    Colon, rectosigmoid mass biopsy --Invasive Adenocarcinoma,  moderately differentiated    02/17/2019 Imaging    CT Chest W Contrast 02/17/19  IMPRESSION: No evidence of metastatic disease in the chest.     02/20/2019 Imaging    CT CP W Contrast 02/20/19  IMPRESSION: 1. Pneumoperitoneum indicating a perforated loop of bowel. There appears to be a 4 cm extraluminal collection of gas and fluid central in the pelvis anterior to the sigmoid colon (series 2, image 68). This is in the region of distal large bowel tumor as described on 02/07/2019. Critical Value/emergent results were called by telephone at the time of interpretation on 02/20/2019 at 1933 hours to PA-C [name] , who verbally acknowledged these results. 2. Small volume of free fluid and free air scattered in the bilateral abdomen. Generalized mesenteric inflammation. 3. No metastatic disease identified.    02/20/2019 Surgery    EXPLORATORY LAPAROTOMY with LOWER ANTERIOR RESECTION WITH COLOSTOMY by Dr. [name]  02/20/19     02/20/2019 Cancer Staging    Staging form: Colon and Rectum, AJCC 8th Edition - Pathologic stage from 02/20/2019: Stage IIA (pT3, pN0, cM0) - Signed by Marie Merle, MD on 02/24/2019    02/20/2019 Pathology Results    Surgical Pathology  Diagnosis Colon, segmental resection for tumor, recto-sigmoid colon - INVASIVE ADENOCARCINOMA, MODERATELY DIFFERENTIATED, SPANNING 4 CM. - TUMOR INVOLVES PERICOLONIC SOFT TISSUE. - DIVERTICULITIS WITH ABSCESS. - SEROSITIS. - DISTAL RESECTION MARGIN IS POSITIVE FOR CARCINOMA. - FIFTEEN OF FIFTEEN LYMPH NODES NEGATIVE FOR CARCINOMA (0/15). - SEE ONCOLOGY TABLE.     02/24/2019 Initial Diagnosis    Rectal cancer ([place])    02/25/2019 Imaging    CT AP 02/25/19  IMPRESSION: 1. Postop changes from low anterior resection with left lower quadrant colostomy. Postop ileus. No evidence  of abscess or bowel obstruction. 2. 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      Chemotherapy    Adjuvant Xeloda '1500mg'$  BID 2 weeks on and 1 week off, every 21 days PENDING to start on 04/21/19      CURRENT THERAPY:  Adjuvant Xeloda '1500mg'$  BID 2 weeks on and 1 week off, every 21 days PENDING to start on 04/21/19   INTERVAL HISTORY:  Marie Levy is here for a follow up. She presents to the clinic today by herself. She notes she has been feeling better. She notes she is 70-80% at baseline. She notes she is eating better and gaining weight. She is changing her wound dressing herself. She feels her wound is healing well.  She notes upper abdominal tightness occasionally. She is not sure what this is related to. She doe snot plan to see her surgery again until June.  She notes she has been taking ASEA supplement which she only noticed increased energy. She notes she takes NP Synthroid and had to reduce dose recently. This is managed by PCP. She would like to monitor her TSH.    REVIEW OF SYSTEMS:   Constitutional: Denies fevers, chills or abnormal weight loss Eyes: Denies blurriness of vision Ears, nose, mouth, throat, and face: Denies mucositis or sore throat Respiratory: Denies cough, dyspnea or wheezes Cardiovascular: Denies palpitation, chest discomfort or lower extremity swelling Gastrointestinal:  Denies nausea, heartburn or change in bowel habits (+) Upper abdominal tightness (+) Surgical wound still healing.  Skin: Denies abnormal skin rashes Lymphatics: Denies new lymphadenopathy or easy bruising Neurological:Denies numbness, tingling or new weaknesses Behavioral/Psych: Mood is stable, no new changes  All other systems were reviewed with the patient and are negative.  MEDICAL HISTORY:  Past Medical History:  Diagnosis Date  . Rectal cancer ([place])   . Thyroid disease     SURGICAL HISTORY: Past Surgical History:  Procedure Laterality Date  . COLECTOMY WITH COLOSTOMY CREATION/HARTMANN PROCEDURE N/A 02/20/2019   Procedure: LOWER ANTERIOR RESECTION WITH  COLOSTOMY;  Surgeon: [name], MD;  Location: [facility];  Service: General;  Laterality: N/A;  . LAPAROTOMY N/A 02/20/2019   Procedure: EXPLORATORY LAPAROTOMY;  Surgeon: [name], MD;  Location: [facility];  Service: General;  Laterality: N/A;  . TONSILLECTOMY      I have reviewed the social history and family history with the patient and they are unchanged from previous note.  ALLERGIES:  has No Known Allergies.  MEDICATIONS:  Current Outpatient Medications  Medication Sig Dispense Refill  . Ascorbic Acid (VITAMIN C) 100 MG tablet Take 100 mg by mouth daily.    . capecitabine (XELODA) 500 MG tablet Take 3 tablets (1,500 mg total) by mouth 2 (two) times daily after a meal. Take for 14 days on, 7 days off, repeat every 21 days. 84 tablet 3  . Digestive Enzyme CAPS Take 1 capsule by mouth daily.    . ferrous sulfate 325 (65 FE) MG tablet Take 27 mg by mouth daily with breakfast. From [employer]    . Flaxseed, Linseed, (FLAXSEED OIL PO) Take by mouth.    . magnesium 30 MG tablet Take 500 mg by mouth daily.     . NP THYROID 90 MG tablet Take 90 mg by mouth daily. Mon-Fri    . polyethylene glycol (MIRALAX / GLYCOLAX) 17 g packet Take 17 g by mouth daily.    . Selenium 100 MCG CAPS  Take 1 capsule by mouth daily.    Marland Kitchen UNABLE TO FIND daily. Med Name: Norville Haggard    . UNABLE TO FIND Take by mouth daily. Med Name: [place]    . UNABLE TO FIND Take by mouth daily. Med Name: HCL    . vitamin B-12 (CYANOCOBALAMIN) 500 MCG tablet Take 500 mcg by mouth daily.    Marland Kitchen VITAMIN D PO Take 30 mLs by mouth daily.    Marland Kitchen zinc gluconate 50 MG tablet Take 50 mg by mouth daily.     No current facility-administered medications for this visit.     PHYSICAL EXAMINATION: ECOG PERFORMANCE STATUS: 1 - Symptomatic but completely ambulatory  Vitals:   04/17/19 1409  BP: 124/82  Pulse: (!) 109  Resp: 18  Temp: 98.2 F (36.8 C)  SpO2: 99%   Filed Weights   04/17/19 1409  Weight: 126 lb 14.4 oz (57.6 kg)     GENERAL:alert, no distress and comfortable SKIN: skin color, texture, turgor are normal, no rashes or significant lesions EYES: normal, Conjunctiva are pink and non-injected, sclera clear OROPHARYNX:no exudate, no erythema and lips, buccal mucosa, and tongue normal  NECK: supple, thyroid normal size, non-tender, without nodularity LYMPH:  no palpable lymphadenopathy in the cervical, axillary or inguinal LUNGS: clear to auscultation and percussion with normal breathing effort HEART: regular rate & rhythm and no murmurs and no lower extremity edema ABDOMEN:abdomen soft, non-tender and normal bowel sounds (+) Surgical incision healing well with small wound open, 4-5cm long, with pick fresh tissue, no discharge, located to the right of ostomy bag, based on the picture she provided me.  Musculoskeletal:no cyanosis of digits and no clubbing  NEURO: alert & oriented x 3 with fluent speech, no focal motor/sensory deficits  LABORATORY DATA:  I have reviewed the data as listed CBC Latest Ref Rng & Units 04/17/2019 03/04/2019 03/03/2019  WBC 4.0 - 10.5 K/uL 8.1 12.5(H) 13.6(H)  Hemoglobin 12.0 - 15.0 g/dL 10.3(L) 9.8(L) 9.6(L)  Hematocrit 36.0 - 46.0 % 34.7(L) 31.3(L) 32.7(L)  Platelets 150 - 400 K/uL 310 624(H) 588(H)     CMP Latest Ref Rng & Units 04/17/2019 03/04/2019 03/03/2019  Glucose 70 - 99 mg/dL 108(H) 121(H) 86  BUN 8 - 23 mg/dL '16 8 9  '$ Creatinine 0.44 - 1.00 mg/dL 0.89 0.63 0.53  Sodium 135 - 145 mmol/L 137 129(L) 130(L)  Potassium 3.5 - 5.1 mmol/L 4.0 3.6 3.6  Chloride 98 - 111 mmol/L 102 97(L) 97(L)  CO2 22 - 32 mmol/L '27 22 23  '$ Calcium 8.9 - 10.3 mg/dL 9.2 7.7(L) 7.8(L)  Total Protein 6.5 - 8.1 g/dL 7.2 - -  Total Bilirubin 0.3 - 1.2 mg/dL 0.4 - -  Alkaline Phos 38 - 126 U/L 81 - -  AST 15 - 41 U/L 22 - -  ALT 0 - 44 U/L 18 - -      RADIOGRAPHIC STUDIES: I have personally reviewed the radiological images as listed and agreed with the findings in the report. No results  found.   ASSESSMENT & PLAN:  RHYSE SKOWRON is a 73 y.o. female with   1. 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 She underwent urgent surgical resection on 02/20/19 due to bowel perforation. Pathology shows moderately differentiated adenomacarcinoma, grade II, positive distal margins, 15 nodes were all negative. Her tumor was also found to be perforated.  -I discussed given her grade II disease, positive margins and perforation, she is  at high risk of local and distant recurrence, especially peritoneal metastasis.  -Dr. [name] had offered second surgery for positive margin, patient declined. -To reduce her risk of recurrence, we recommend adjuvant chemo and radiation. She is not interested in IV chemo. I recommend oral Xeloda for 6 months, including Xeloda with concurrent radiation. Due to the current COVID-19 pandemic, and her wound healing issue, adjuvant radiation will likely be postponed for 1 to 2 months. G -She has met with Dr [name] to discussed adjuvant radiation to reduce her high risk of local recurrence. She understands and has declined for now. I discussed second surgery with Dr [name] is also an option but will likely be harder on her.  -She is recovering well from surgery, eating better, increased energy and gaining weight. Her wound is still healing about 4-5cm long. I discussed it will take 1-2 months to heal completely. She will monitor her wound healing at home as well.  -I discussed starting oral chemo Xeloda as her surgery was 2 months ago. This should not drastically effect her healing.  --Chemotherapy consent: Side effects including but does not limited to, fatigue, nausea, vomiting, diarrhea, hair loss, neuropathy, fluid retention, renal and kidney dysfunction, Hand-foot syndrome, neutropenic fever, infections, needed for blood transfusion, bleeding, were discussed with patient in great detail. She  agrees to proceed. Plan to start on 04/21/19.  -I called in antiemetics to use as needed. I encouraged her to contact clinic for signs of infection, fever, significant or unexpected side effects.  -Labs reviewed, CBC WNL except Hg 10.3, CMP WNL. CEA, TSH and Iron panel still pending.  -She is currently taking multiple supplements including Selenium and ASEA, I checked with pharmacy and found no significant interaction with Xeloda  -F/u call next week. F/u in 3 weeks    2. Weight loss  -She was 145 at baseline.  -She lost about 20-30 pounds since onset of initial symptoms and liquid diet. Much improved after surgery.  -She takes protein shakes. I encouraged her to continue and increase calorie and protein in diet.  -She has been eating well and gaining weight back.   3. Anemia, likely iron deficiency -secondary to #1 -Hg has dropped since surgery. Labs from 03/04/19 show Hg at 9.8, MCV 76.3 and PLT 624, which supports iron deficiency.  -She currently on ferrous sulfate '75mg'$  1-2 tabs a day. -Hg improved to 10.3, Iron panel is still pending. (04/17/19). I discussed her anemia has not completely responded to oral iron in 3 weeks. If iron level is still low I recommend IV iron for more direct correction.   4. Hypothyroidism  -On NP Synthroid '90mg'$  managed by PCP -Her TSH level is pending. I will copy note to her PCP    PLAN:  -Start Xeloda '1500mg'$  q12h, on day 1-14 every 21 days on 04/21/19 -She will monitor her wound healing at home  -F/u call next week  -Lab and f/u 3 weeks    No problem-specific Assessment & Plan notes found for this encounter.   No orders of the defined types were placed in this encounter.  All questions were answered. The patient knows to call the clinic with any problems, questions or concerns. No barriers to learning was detected. I spent 20 minutes counseling the patient face to face. The total time spent in the appointment was 25 minutes and more than 50% was  on counseling and review of test results     Marie Merle, MD 04/17/2019   I, Joslyn Devon,  am acting as scribe for Kayana Thoen, MD.   I have reviewed the above documentation for accuracy and completeness, and I agree with the above.       

## 2019-04-17 ENCOUNTER — Other Ambulatory Visit: Payer: Self-pay

## 2019-04-17 ENCOUNTER — Encounter: Payer: Self-pay | Admitting: Hematology

## 2019-04-17 ENCOUNTER — Inpatient Hospital Stay (HOSPITAL_BASED_OUTPATIENT_CLINIC_OR_DEPARTMENT_OTHER): Payer: PPO | Admitting: Hematology

## 2019-04-17 ENCOUNTER — Inpatient Hospital Stay: Payer: PPO | Attending: Hematology

## 2019-04-17 ENCOUNTER — Telehealth: Payer: Self-pay | Admitting: Hematology

## 2019-04-17 VITALS — BP 124/82 | HR 109 | Temp 98.2°F | Resp 18 | Ht 66.0 in | Wt 126.9 lb

## 2019-04-17 DIAGNOSIS — D649 Anemia, unspecified: Secondary | ICD-10-CM

## 2019-04-17 DIAGNOSIS — C20 Malignant neoplasm of rectum: Secondary | ICD-10-CM

## 2019-04-17 DIAGNOSIS — E039 Hypothyroidism, unspecified: Secondary | ICD-10-CM

## 2019-04-17 DIAGNOSIS — Z79899 Other long term (current) drug therapy: Secondary | ICD-10-CM | POA: Diagnosis not present

## 2019-04-17 LAB — CBC WITH DIFFERENTIAL (CANCER CENTER ONLY)
Abs Immature Granulocytes: 0.02 10*3/uL (ref 0.00–0.07)
Basophils Absolute: 0.1 10*3/uL (ref 0.0–0.1)
Basophils Relative: 1 %
Eosinophils Absolute: 0.1 10*3/uL (ref 0.0–0.5)
Eosinophils Relative: 1 %
HCT: 34.7 % — ABNORMAL LOW (ref 36.0–46.0)
Hemoglobin: 10.3 g/dL — ABNORMAL LOW (ref 12.0–15.0)
Immature Granulocytes: 0 %
Lymphocytes Relative: 37 %
Lymphs Abs: 3 10*3/uL (ref 0.7–4.0)
MCH: 23.8 pg — ABNORMAL LOW (ref 26.0–34.0)
MCHC: 29.7 g/dL — ABNORMAL LOW (ref 30.0–36.0)
MCV: 80.3 fL (ref 80.0–100.0)
Monocytes Absolute: 0.8 10*3/uL (ref 0.1–1.0)
Monocytes Relative: 10 %
Neutro Abs: 4.2 10*3/uL (ref 1.7–7.7)
Neutrophils Relative %: 51 %
Platelet Count: 310 10*3/uL (ref 150–400)
RBC: 4.32 MIL/uL (ref 3.87–5.11)
RDW: 18.7 % — ABNORMAL HIGH (ref 11.5–15.5)
WBC Count: 8.1 10*3/uL (ref 4.0–10.5)
nRBC: 0 % (ref 0.0–0.2)

## 2019-04-17 LAB — CMP (CANCER CENTER ONLY)
ALT: 18 U/L (ref 0–44)
AST: 22 U/L (ref 15–41)
Albumin: 3.6 g/dL (ref 3.5–5.0)
Alkaline Phosphatase: 81 U/L (ref 38–126)
Anion gap: 8 (ref 5–15)
BUN: 16 mg/dL (ref 8–23)
CO2: 27 mmol/L (ref 22–32)
Calcium: 9.2 mg/dL (ref 8.9–10.3)
Chloride: 102 mmol/L (ref 98–111)
Creatinine: 0.89 mg/dL (ref 0.44–1.00)
GFR, Est AFR Am: >60 mL/min (ref >60)
GFR, Estimated: >60 mL/min (ref >60)
Glucose, Bld: 108 mg/dL — ABNORMAL HIGH (ref 70–99)
Potassium: 4 mmol/L (ref 3.5–5.1)
Sodium: 137 mmol/L (ref 135–145)
Total Bilirubin: 0.4 mg/dL (ref 0.3–1.2)
Total Protein: 7.2 g/dL (ref 6.5–8.1)

## 2019-04-17 LAB — CEA (IN HOUSE-CHCC): CEA (CHCC-In House): 1.55 ng/mL (ref 0.00–5.00)

## 2019-04-17 LAB — IRON AND TIBC
Iron: 164 ug/dL — ABNORMAL HIGH (ref 41–142)
Saturation Ratios: 40 % (ref 21–57)
TIBC: 409 ug/dL (ref 236–444)
UIBC: 244 ug/dL (ref 120–384)

## 2019-04-17 LAB — TSH: TSH: 6.944 u[IU]/mL — ABNORMAL HIGH (ref 0.308–3.960)

## 2019-04-17 LAB — FERRITIN: Ferritin: 5 ng/mL — ABNORMAL LOW (ref 11–307)

## 2019-04-17 NOTE — Telephone Encounter (Signed)
Scheduled appt per 5/14 los. ° °A calendar will be mailed out. °

## 2019-04-18 ENCOUNTER — Telehealth: Payer: Self-pay | Admitting: Pharmacist

## 2019-04-18 DIAGNOSIS — C20 Malignant neoplasm of rectum: Secondary | ICD-10-CM | POA: Diagnosis not present

## 2019-04-18 NOTE — Telephone Encounter (Signed)
Oral Chemotherapy Pharmacist Encounter   I spoke with patient for overview of: Xeloda (capecitabine) for the adjuvant treatment of stage IIA rectal cancer in conjunction with radiation, planned duration 6 total months of Xeloda.   Counseled patient on administration, dosing, side effects, monitoring, drug-food interactions, safe handling, storage, and disposal.  Patient will take Xeloda 500mg  tablets, 3 tablets (1500mg ) by mouth in AM and 3 tabs (1500mg ) by mouth in PM, within 30 minutes of finishing meals, on days 1-14 of each 21 day cycle. .  Xeloda start date: 04/21/2019  Adverse effects include but are not limited to: fatigue, decreased blood counts, GI upset, diarrhea, mouth sores, and hand-foot syndrome.  Patient will alert the office for an anti-emetic prescription it if nausea develops.   Patient will obtain anti diarrheal and alert the office of 4 or more loose stools above baseline.  Reviewed with patient importance of keeping a medication schedule and plan for any missed doses.  Medication reconciliation performed and medication/allergy list updated. We extensively current herbal supplements that she is taking and additional precautions to be taken due to the COVID-19 pandemic.  Patient received her first fill of Xeloda from [facility] due to unaffordable co-payment. Oral oncology patient advocate was successful in obtaining foundation copayment grant to cover out-of-pocket expenses for Xeloda at the pharmacy. Subsequent fills of Xeloda will come from the [facility].  Patient informed the pharmacy will reach out 5-7 days prior to needing next fill of Xeloda to coordinate continued medication acquisition to prevent break in therapy.  Patient with questions about a letter she had received from [facility] with an application for their financial assistance program that also requests proof of income documentation. A physician  form verifying Xeloda indication was also included in this packet. She would like to know whether she needs to fill out this information and get it back to [facility]. We will contact [facility] at [phone] for any additional information they may be able to provide, and follow-up with the patient with this information.  All questions answered.  Ms. Long voiced understanding and appreciation.   Patient knows to call the office with questions or concerns.  Johny Drilling, PharmD, BCPS, BCOP  04/18/2019   10:16 AM Oral Oncology Clinic [phone]

## 2019-04-18 NOTE — Telephone Encounter (Signed)
Oral Oncology Pharmacist Encounter  I called [facility] at [phone] to inquire about assistance application in physician form that patient had received in the mail. Representative stated that first fill of Xeloda was filled on a manufacturer voucher and that the mailed application would be to help with subsequent fills of the Xeloda. Since patient is no longer receiving her Xeloda prescriptions at their pharmacy there is no need to fill out and send back the application.  There is also no need to fill out and send back to physician form. I confirmed that patient will not receive a bill for her previous Xeloda dispense from [facility].  I called and left voicemail for patient with above information and direct dial to oral oncology clinic if patient has any additional questions or concerns.  Johny Drilling, PharmD, BCPS, BCOP  04/18/2019 3:43 PM Oral Oncology Clinic [phone]

## 2019-04-19 ENCOUNTER — Other Ambulatory Visit: Payer: Self-pay | Admitting: Hematology

## 2019-04-19 DIAGNOSIS — D500 Iron deficiency anemia secondary to blood loss (chronic): Secondary | ICD-10-CM | POA: Insufficient documentation

## 2019-04-21 ENCOUNTER — Telehealth: Payer: Self-pay | Admitting: *Deleted

## 2019-04-21 NOTE — Telephone Encounter (Signed)
TCT patient regarding lab results from 04/17/19.  No answer but was able to leave vm message for pt to return call @ [phone]. Pt's Ferritin level is low and will need IV iron  Scheduling message sent for Iron infusion with pt's next office appt on 05/09/19

## 2019-04-22 ENCOUNTER — Telehealth: Payer: Self-pay | Admitting: Hematology

## 2019-04-22 NOTE — Telephone Encounter (Signed)
Called patient to inform of phone visit appt on 5/21. Patient confirmed

## 2019-04-23 ENCOUNTER — Telehealth: Payer: Self-pay | Admitting: Hematology

## 2019-04-23 NOTE — Telephone Encounter (Signed)
Contacted patient to verify phone visit appt.for pre reg °

## 2019-04-23 NOTE — Progress Notes (Signed)
[place]   [phone] [phone]   Clinic Follow up Note   Patient Care Team: [name], MD as PCP - General (Family Medicine) [name], MD as Consulting Physician (General Surgery) [name], MD as Consulting Physician (Radiation Oncology) [name], MD as Consulting Physician (Hematology)   I connected with Marie Levy on 04/24/2019 at 10:45 AM EDT by telephone visit and verified that I am speaking with the correct person using two identifiers.  I discussed the limitations, risks, security and privacy concerns of performing an evaluation and management service by telephone and the availability of in person appointments. I also discussed with the patient that there may be a patient responsible charge related to this service. The patient expressed understanding and agreed to proceed.   Patient's location:  Her home  Provider's location:  My Office   CHIEF COMPLAINT: f/u of rectal cancer  SUMMARY OF ONCOLOGIC HISTORY: Oncology History   Cancer Staging Rectal cancer [facility]) Staging form: Colon and Rectum, AJCC 8th Edition - Pathologic stage from 02/20/2019: Stage IIA (pT3, pN0, cM0) - Signed by [name], MD on 02/24/2019       Rectal cancer ([place])   02/08/2019 Imaging    CT AP W Contrast 02/08/19  IMPRESSION: 1. The rectum and sigmoid colon are markedly abnormal with wall thickening, somewhat masslike in appearance in some regions. There is also a region of stricture/narrowing resulting in partial obstruction with fecal loading throughout the remainder of the colon. The patient is at risk of developing a high-grade obstruction. The constellation of findings could be due to an underlying malignancy. A benign stricture with a superimposed colitis/proctitis is a possibility. An infectious or inflammatory colitis are also possibilities. Recommend consultation with gastroenterology. The patient will likely require  direct visualization to determine the underlying etiology. Again, malignancy is not excluded. 2. Gastric distention. There is wall thickening in the region of the antrum just distal to the distension. Recommend direct visualization or upper GI. 3. No other significant abnormalities.    02/11/2019 Procedure    Flexible Sigmoidoscopy 02/11/19 by Dr. Benson Norway IMPRESSION -a malignant completely obstructing tumor in the recto-sigmoid colon - biopsied.    02/11/2019 Initial Biopsy    Colon, rectosigmoid mass biopsy --Invasive Adenocarcinoma, moderately differentiated    02/17/2019 Imaging    CT Chest W Contrast 02/17/19  IMPRESSION: No evidence of metastatic disease in the chest.     02/20/2019 Imaging    CT CP W Contrast 02/20/19  IMPRESSION: 1. Pneumoperitoneum indicating a perforated loop of bowel. There appears to be a 4 cm extraluminal collection of gas and fluid central in the pelvis anterior to the sigmoid colon (series 2, image 68). This is in the region of distal large bowel tumor as described on 02/07/2019. Critical Value/emergent results were called by telephone at the time of interpretation on 02/20/2019 at 1933 hours to PA-C [name] , who verbally acknowledged these results. 2. Small volume of free fluid and free air scattered in the bilateral abdomen. Generalized mesenteric inflammation. 3. No metastatic disease identified.    02/20/2019 Surgery    EXPLORATORY LAPAROTOMY with LOWER ANTERIOR RESECTION WITH COLOSTOMY by Dr. [name]  02/20/19     02/20/2019 Cancer Staging    Staging form: Colon and Rectum, AJCC 8th Edition - Pathologic stage from 02/20/2019: Stage IIA (pT3, pN0, cM0) - Signed by [name], MD on 02/24/2019    02/20/2019 Pathology Results    Surgical Pathology  Diagnosis Colon,  segmental resection for tumor, recto-sigmoid colon - INVASIVE ADENOCARCINOMA, MODERATELY DIFFERENTIATED, SPANNING 4 CM. - TUMOR INVOLVES PERICOLONIC SOFT TISSUE. - DIVERTICULITIS  WITH ABSCESS. - SEROSITIS. - DISTAL RESECTION MARGIN IS POSITIVE FOR CARCINOMA. - FIFTEEN OF FIFTEEN LYMPH NODES NEGATIVE FOR CARCINOMA (0/15). - SEE ONCOLOGY TABLE.     02/24/2019 Initial Diagnosis    Rectal cancer ([place])    02/25/2019 Imaging    CT AP 02/25/19  IMPRESSION: 1. Postop changes from low anterior resection with left lower quadrant colostomy. Postop ileus. No evidence of abscess or bowel obstruction. 2. 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     Chemotherapy    Adjuvant Xeloda 1500mg  BID 2 weeks on and 1 week off, every 21 days PENDING to start on 04/21/19      CURRENT THERAPY:  Adjuvant Xeloda 1500mg  BID 2 weeks on and 1 week off, every 21 day starting on 04/21/19  INTERVAL HISTORY:  Marie Levy is here for a follow up of rectal cancer. She was able to identify herself by birth date.  She notes she has been fatigued more but this is not debilitating. She denies diarrhea, nausea or skin toxicity.  She thinks she has hemorrhoids due to clear anal leakage.    REVIEW OF SYSTEMS:   Constitutional: Denies fevers, chills or abnormal weight loss Eyes: Denies blurriness of vision Ears, nose, mouth, throat, and face: Denies mucositis or sore throat Respiratory: Denies cough, dyspnea or wheezes Cardiovascular: Denies palpitation, chest discomfort or lower extremity swelling Gastrointestinal:  Denies nausea, heartburn or change in bowel habits Skin: Denies abnormal skin rashes Lymphatics: Denies new lymphadenopathy or easy bruising Neurological:Denies numbness, tingling or new weaknesses Behavioral/Psych: Mood is stable, no new changes  All other systems were reviewed with the patient and are negative.  MEDICAL HISTORY:  Past Medical History:  Diagnosis Date   Rectal cancer ([place])    Thyroid disease     SURGICAL HISTORY: Past Surgical History:  Procedure Laterality Date   COLECTOMY  WITH COLOSTOMY CREATION/HARTMANN PROCEDURE N/A 02/20/2019   Procedure: LOWER ANTERIOR RESECTION WITH COLOSTOMY;  Surgeon: [name], MD;  Location: [facility];  Service: General;  Laterality: N/A;   LAPAROTOMY N/A 02/20/2019   Procedure: EXPLORATORY LAPAROTOMY;  Surgeon: [name], MD;  Location: [facility];  Service: General;  Laterality: N/A;   TONSILLECTOMY      I have reviewed the social history and family history with the patient and they are unchanged from previous note.  ALLERGIES:  has No Known Allergies.  MEDICATIONS:  Current Outpatient Medications  Medication Sig Dispense Refill   Ascorbic Acid (VITAMIN C) 100 MG tablet Take 100 mg by mouth daily.     capecitabine (XELODA) 500 MG tablet Take 3 tablets (1,500 mg total) by mouth 2 (two) times daily after a meal. Take for 14 days on, 7 days off, repeat every 21 days. 84 tablet 3   Digestive Enzyme CAPS Take 1 capsule by mouth daily.     ferrous sulfate 325 (65 FE) MG tablet Take 27 mg by mouth daily with breakfast. From [employer]     Flaxseed, Linseed, (FLAXSEED OIL PO) Take by mouth.     magnesium 30 MG tablet Take 500 mg by mouth daily.      NP THYROID 90 MG tablet Take 90 mg by mouth daily. Mon-Fri     polyethylene glycol (MIRALAX / GLYCOLAX) 17 g packet Take 17 g by mouth  daily.     Selenium 100 MCG CAPS Take 1 capsule by mouth daily.     UNABLE TO FIND daily. Med Name: Tumero     UNABLE TO FIND Take by mouth daily. Med Name: [place]     UNABLE TO FIND Take by mouth daily. Med Name: HCL     vitamin B-12 (CYANOCOBALAMIN) 500 MCG tablet Take 500 mcg by mouth daily.     VITAMIN D PO Take 30 mLs by mouth daily.     zinc gluconate 50 MG tablet Take 50 mg by mouth daily.     No current facility-administered medications for this visit.     PHYSICAL EXAMINATION: ECOG PERFORMANCE STATUS: 1 - Symptomatic but completely ambulatory  No vitals taken today, Exam not performed today  LABORATORY DATA:  I  have reviewed the data as listed CBC Latest Ref Rng & Units 04/17/2019 03/04/2019 03/03/2019  WBC 4.0 - 10.5 K/uL 8.1 12.5(H) 13.6(H)  Hemoglobin 12.0 - 15.0 g/dL 10.3(L) 9.8(L) 9.6(L)  Hematocrit 36.0 - 46.0 % 34.7(L) 31.3(L) 32.7(L)  Platelets 150 - 400 K/uL 310 624(H) 588(H)     CMP Latest Ref Rng & Units 04/17/2019 03/04/2019 03/03/2019  Glucose 70 - 99 mg/dL 108(H) 121(H) 86  BUN 8 - 23 mg/dL 16 8 9   Creatinine 0.44 - 1.00 mg/dL 0.89 0.63 0.53  Sodium 135 - 145 mmol/L 137 129(L) 130(L)  Potassium 3.5 - 5.1 mmol/L 4.0 3.6 3.6  Chloride 98 - 111 mmol/L 102 97(L) 97(L)  CO2 22 - 32 mmol/L 27 22 23   Calcium 8.9 - 10.3 mg/dL 9.2 7.7(L) 7.8(L)  Total Protein 6.5 - 8.1 g/dL 7.2 - -  Total Bilirubin 0.3 - 1.2 mg/dL 0.4 - -  Alkaline Phos 38 - 126 U/L 81 - -  AST 15 - 41 U/L 22 - -  ALT 0 - 44 U/L 18 - -      RADIOGRAPHIC STUDIES: I have personally reviewed the radiological images as listed and agreed with the findings in the report. No results found.   ASSESSMENT & PLAN:  Marie Levy is a 73 y.o. female with   1. 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 She underwenturgentsurgical resection on 02/20/19 due to bowel perforation. Pathology shows moderately differentiated adenomacarcinoma, grade II, positivedistalmargins, 15nodes were allnegative. Her tumor was also found to be perforated.  -I discussed given her grade II disease, positive margins and perforation, she is at high risk of local and distant recurrence,especially peritoneal metastasis.  -Dr. Raliegh Scarlet second surgery for positive margin, patient declined. -To reduce her risk of recurrence, she started adjuvant Xeloda 1500mg  q12h, on day 1-14 every 21 days on 04/21/19. Tolerating well with mild fatigue so far.  -She is fine to continue her multiple supplements.  -f/u in 2 weeks before cycle 2    2. Weight loss  -She was 145  at baseline.  -She lost about 20-30 pounds since onset of initial symptoms and liquid diet. Much improved after surgery.  -She takes protein shakes. I encouraged her to continue and increase calorie and protein in diet.  -She has been eating well and gaining weight back.    3. Anemia, likely iron deficiency -secondary to #1 -Hg has dropped since surgery. Labs from 03/04/19 show Hg at 9.8, MCV 76.3 and PLT 624, which supports iron deficiency.  -She currently on ferrous sulfate 75mg  1-2 tabs a day. -I discussed her anemia has not completely responded to  oral iron in 3 weeks. If iron level is still low I recommend IV iron for more direct correction.   4. Hypothyroidism  -On NP Synthroid 90mg  managed by PCP -Her 04/17/19 TSH level at 6.944, I suggest her to f/u with PCP    PLAN: -Continue Xeloda 1500mg  q12h, on day 1-14 every 21 days, started on 04/21/19 -She will monitor her wound healing at home  -Lab and f/u 2 weeks     No problem-specific Assessment & Plan notes found for this encounter.   No orders of the defined types were placed in this encounter.  I discussed the assessment and treatment plan with the patient. The patient was provided an opportunity to ask questions and all were answered. The patient agreed with the plan and demonstrated an understanding of the instructions.  The patient was advised to call back or seek an in-person evaluation if the symptoms worsen or if the condition fails to improve as anticipated.  I provided 12 minutes of non face-to-face telephone visit time during this encounter, and > 50% was spent counseling as documented under my assessment & plan.    [name], MD 04/24/2019   I, Joslyn Devon, am acting as scribe for [name], MD.   I have reviewed the above documentation for accuracy and completeness, and I agree with the above.

## 2019-04-24 ENCOUNTER — Inpatient Hospital Stay (HOSPITAL_BASED_OUTPATIENT_CLINIC_OR_DEPARTMENT_OTHER): Payer: PPO | Admitting: Hematology

## 2019-04-24 ENCOUNTER — Encounter: Payer: Self-pay | Admitting: Hematology

## 2019-04-24 DIAGNOSIS — E039 Hypothyroidism, unspecified: Secondary | ICD-10-CM | POA: Diagnosis not present

## 2019-04-24 DIAGNOSIS — Z9221 Personal history of antineoplastic chemotherapy: Secondary | ICD-10-CM | POA: Diagnosis not present

## 2019-04-24 DIAGNOSIS — Z79899 Other long term (current) drug therapy: Secondary | ICD-10-CM | POA: Diagnosis not present

## 2019-04-24 DIAGNOSIS — C20 Malignant neoplasm of rectum: Secondary | ICD-10-CM

## 2019-04-24 DIAGNOSIS — D508 Other iron deficiency anemias: Secondary | ICD-10-CM

## 2019-04-29 ENCOUNTER — Telehealth: Payer: Self-pay | Admitting: Hematology

## 2019-04-29 NOTE — Telephone Encounter (Signed)
No los per 5/21. °

## 2019-05-07 NOTE — Progress Notes (Signed)
[place]   [phone] Fax:([phone]) [phone]   Clinic Follow up Note   Patient Care Team: [name], MD as PCP - General (Family Medicine) [name], MD as Consulting Physician (General Surgery) [name], MD as Consulting Physician (Radiation Oncology) [name], MD as Consulting Physician (Hematology)  Date of Service:  05/09/2019  CHIEF COMPLAINT: f/u of rectal cancer  SUMMARY OF ONCOLOGIC HISTORY: Oncology History   Cancer Staging Rectal cancer [facility]) Staging form: Colon and Rectum, AJCC 8th Edition - Pathologic stage from 02/20/2019: Stage IIA (pT3, pN0, cM0) - Signed by [name], MD on 02/24/2019       Rectal cancer ([place])   02/08/2019 Imaging    CT AP W Contrast 02/08/19  IMPRESSION: 1. The rectum and sigmoid colon are markedly abnormal with wall thickening, somewhat masslike in appearance in some regions. There is also a region of stricture/narrowing resulting in partial obstruction with fecal loading throughout the remainder of the colon. The patient is at risk of developing a high-grade obstruction. The constellation of findings could be due to an underlying malignancy. A benign stricture with a superimposed colitis/proctitis is a possibility. An infectious or inflammatory colitis are also possibilities. Recommend consultation with gastroenterology. The patient will likely require direct visualization to determine the underlying etiology. Again, malignancy is not excluded. 2. Gastric distention. There is wall thickening in the region of the antrum just distal to the distension. Recommend direct visualization or upper GI. 3. No other significant abnormalities.    02/11/2019 Procedure    Flexible Sigmoidoscopy 02/11/19 by Dr. Benson Norway IMPRESSION -a malignant completely obstructing tumor in the recto-sigmoid colon - biopsied.    02/11/2019 Initial Biopsy    Colon, rectosigmoid mass biopsy --Invasive Adenocarcinoma, moderately  differentiated    02/17/2019 Imaging    CT Chest W Contrast 02/17/19  IMPRESSION: No evidence of metastatic disease in the chest.     02/20/2019 Imaging    CT CP W Contrast 02/20/19  IMPRESSION: 1. Pneumoperitoneum indicating a perforated loop of bowel. There appears to be a 4 cm extraluminal collection of gas and fluid central in the pelvis anterior to the sigmoid colon (series 2, image 68). This is in the region of distal large bowel tumor as described on 02/07/2019. Critical Value/emergent results were called by telephone at the time of interpretation on 02/20/2019 at 1933 hours to PA-C [name] , who verbally acknowledged these results. 2. Small volume of free fluid and free air scattered in the bilateral abdomen. Generalized mesenteric inflammation. 3. No metastatic disease identified.    02/20/2019 Surgery    EXPLORATORY LAPAROTOMY with LOWER ANTERIOR RESECTION WITH COLOSTOMY by Dr. [name]  02/20/19     02/20/2019 Cancer Staging    Staging form: Colon and Rectum, AJCC 8th Edition - Pathologic stage from 02/20/2019: Stage IIA (pT3, pN0, cM0) - Signed by [name], MD on 02/24/2019    02/20/2019 Pathology Results    Surgical Pathology  Diagnosis Colon, segmental resection for tumor, recto-sigmoid colon - INVASIVE ADENOCARCINOMA, MODERATELY DIFFERENTIATED, SPANNING 4 CM. - TUMOR INVOLVES PERICOLONIC SOFT TISSUE. - DIVERTICULITIS WITH ABSCESS. - SEROSITIS. - DISTAL RESECTION MARGIN IS POSITIVE FOR CARCINOMA. - FIFTEEN OF FIFTEEN LYMPH NODES NEGATIVE FOR CARCINOMA (0/15). - SEE ONCOLOGY TABLE.     02/24/2019 Initial Diagnosis    Rectal cancer ([place])    02/25/2019 Imaging    CT AP 02/25/19  IMPRESSION: 1. Postop changes from low anterior resection with left lower quadrant colostomy. Postop ileus. No evidence  of abscess or bowel obstruction. 2. 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      Chemotherapy    Adjuvant Xeloda 1500mg  BID 2 weeks on and 1 week off, every 21 days PENDING to start on 04/21/19      CURRENT THERAPY:  AdjuvantXeloda 1500mg BID 2 weeks on and 1 week off,every 21 day starting on 04/21/19  INTERVAL HISTORY:  Marie Levy is here for a follow up of treatment. He presents to the clinic alone. She notes she is doing well. She is on her off week of Xeloda. She notes her abdomen felt sore like after her surgery. She was also bloated and fatigued. This started within the first week of cycle 1. She notes this did improve as the days went. She was still able to eat and drink. She notes her BMs stable and normal. Her palms are not cracked or peeling. She notes internal lower leg skin is dry and flaky. She uses body butter to help. She notes her hair is thinning more and falling out some.  She notes she returned to regular dose NP Thyroid 90mg . She wonders can  She notes she plans to see Dr [name] about her anal discharge that looks like chocolate syrup. She notes her surgical incision healed well.     REVIEW OF SYSTEMS:   Constitutional: Denies fevers, chills or abnormal weight loss Eyes: Denies blurriness of vision Ears, nose, mouth, throat, and face: Denies mucositis or sore throat Respiratory: Denies cough, dyspnea or wheezes Cardiovascular: Denies palpitation, chest discomfort or lower extremity swelling Gastrointestinal:  Denies nausea, heartburn or change in bowel habits (+) mild Anal discharge  Skin: Denies abnormal skin rashes (+) internal lower leg skin is dry and flaky Lymphatics: Denies new lymphadenopathy or easy bruising Neurological:Denies numbness, tingling or new weaknesses Behavioral/Psych: Mood is stable, no new changes  All other systems were reviewed with the patient and are negative.  MEDICAL HISTORY:  Past Medical History:  Diagnosis Date  . Rectal cancer ([place])   . Thyroid disease     SURGICAL HISTORY: Past Surgical History:   Procedure Laterality Date  . COLECTOMY WITH COLOSTOMY CREATION/HARTMANN PROCEDURE N/A 02/20/2019   Procedure: LOWER ANTERIOR RESECTION WITH COLOSTOMY;  Surgeon: [name], MD;  Location: [facility];  Service: General;  Laterality: N/A;  . LAPAROTOMY N/A 02/20/2019   Procedure: EXPLORATORY LAPAROTOMY;  Surgeon: [name], MD;  Location: [facility];  Service: General;  Laterality: N/A;  . TONSILLECTOMY      I have reviewed the social history and family history with the patient and they are unchanged from previous note.  ALLERGIES:  has No Known Allergies.  MEDICATIONS:  Current Outpatient Medications  Medication Sig Dispense Refill  . Ascorbic Acid (VITAMIN C) 100 MG tablet Take 100 mg by mouth daily.    . capecitabine (XELODA) 500 MG tablet Take 3 tablets (1,500 mg total) by mouth 2 (two) times daily after a meal. Take for 14 days on, 7 days off, repeat every 21 days. 84 tablet 3  . Digestive Enzyme CAPS Take 1 capsule by mouth daily.    . ferrous sulfate 325 (65 FE) MG tablet Take 27 mg by mouth daily with breakfast. From [employer]    . Flaxseed, Linseed, (FLAXSEED OIL PO) Take by mouth.    . Garlic 10 MG CAPS Take by mouth.    . magnesium 30 MG tablet Take 500 mg by mouth daily.     Marland Kitchen  NP THYROID 90 MG tablet Take 90 mg by mouth daily. Mon-Fri    . polyethylene glycol (MIRALAX / GLYCOLAX) 17 g packet Take 17 g by mouth daily.    . Selenium 100 MCG CAPS Take 1 capsule by mouth daily.    Marland Kitchen UNABLE TO FIND daily. Med Name: Norville Haggard    . UNABLE TO FIND Take by mouth daily. Med Name: [place]    . UNABLE TO FIND Take by mouth daily. Med Name: HCL    . vitamin B-12 (CYANOCOBALAMIN) 500 MCG tablet Take 500 mcg by mouth daily.    Marland Kitchen VITAMIN D PO Take 30 mLs by mouth daily.    Marland Kitchen zinc gluconate 50 MG tablet Take 50 mg by mouth daily.     No current facility-administered medications for this visit.     PHYSICAL EXAMINATION: ECOG PERFORMANCE STATUS: 1 - Symptomatic but completely  ambulatory  Vitals:   05/09/19 1150  BP: 105/73  Pulse: 97  Resp: 18  Temp: 98.2 F (36.8 C)  SpO2: 99%   Filed Weights   05/09/19 1150  Weight: 126 lb 1.6 oz (57.2 kg)    GENERAL:alert, no distress and comfortable SKIN: skin color, texture, turgor are normal, no rashes or significant lesions EYES: normal, Conjunctiva are pink and non-injected, sclera clear NECK: supple, thyroid normal size, non-tender, without nodularity LYMPH:  no palpable lymphadenopathy in the cervical, axillary  LUNGS: clear to auscultation and percussion with normal breathing effort HEART: regular rate & rhythm and no murmurs and no lower extremity edema ABDOMEN:abdomen soft, non-tender and normal bowel sounds (+) Surgical mid-line incision is covered, based on the picture pt showed me, it's healing well with small wound open, slightly smaller than before, 4-5cm long, with pick fresh tissue, no discharge, located to the right of ostomy bag.  Musculoskeletal:no cyanosis of digits and no clubbing  NEURO: alert & oriented x 3 with fluent speech, no focal motor/sensory deficits  LABORATORY DATA:  I have reviewed the data as listed CBC Latest Ref Rng & Units 05/09/2019 04/17/2019 03/04/2019  WBC 4.0 - 10.5 K/uL 6.6 8.1 12.5(H)  Hemoglobin 12.0 - 15.0 g/dL 11.3(L) 10.3(L) 9.8(L)  Hematocrit 36.0 - 46.0 % 37.7 34.7(L) 31.3(L)  Platelets 150 - 400 K/uL 303 310 624(H)     CMP Latest Ref Rng & Units 05/09/2019 04/17/2019 03/04/2019  Glucose 70 - 99 mg/dL 89 108(H) 121(H)  BUN 8 - 23 mg/dL 21 16 8   Creatinine 0.44 - 1.00 mg/dL 0.78 0.89 0.63  Sodium 135 - 145 mmol/L 139 137 129(L)  Potassium 3.5 - 5.1 mmol/L 4.1 4.0 3.6  Chloride 98 - 111 mmol/L 104 102 97(L)  CO2 22 - 32 mmol/L 24 27 22   Calcium 8.9 - 10.3 mg/dL 9.0 9.2 7.7(L)  Total Protein 6.5 - 8.1 g/dL 7.3 7.2 -  Total Bilirubin 0.3 - 1.2 mg/dL 0.4 0.4 -  Alkaline Phos 38 - 126 U/L 72 81 -  AST 15 - 41 U/L 18 22 -  ALT 0 - 44 U/L 11 18 -       RADIOGRAPHIC STUDIES: I have personally reviewed the radiological images as listed and agreed with the findings in the report. No results found.   ASSESSMENT & PLAN:  Marie Levy is a 73 y.o. female with   1. 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 She underwenturgentsurgical resection on 02/20/19 due to bowel perforation. Pathology shows moderately differentiated adenomacarcinoma, grade II, positivedistalmargins, 15nodes were allnegative.  Her tumor was also found to be perforated.  -I discussed given her grade II disease, positive margins and perforation, she is at high risk of local and distant recurrence,especially peritoneal metastasis. -Dr. Raliegh Scarlet second surgery for positive margin, patient declined. -To reduce her risk of recurrence, she started adjuvant Xeloda 1500mg  q12h, on day 1-14 every 21 dayson 04/21/19. She tolerated first cycle moderately well with mild hair loss, significant fatigue, abdominal soreness and bloating. This dissipated around week 2.  -Based on picture patient provided me today, her surgical incision is healing well. She has occasional anal mucus discharge. She will f/u with Dr. [name].  -Labs reviewed, CBC and CMP WNL except Hg 11.3, CMP WNL. She is fine to proceed with Xeloda cycle 2 starting 05/12/19. Will watch for hand-foot syndrome and neuropathy. She can call me if she needs to dose reduce or hold treatment.  -f/u in 3 weeks    2. Weight loss  -She was 145 at baseline.  -She lost about 20-30 pounds since onset of initial symptoms and liquid diet. Much improved after surgery. -She takes protein shakes. I encouraged her to continue and increase calorie and protein in diet. -She has been eating well and gaining weight back.Weight now stable.   3. Anemia, likely iron deficiency -secondary to #1 -Hg has dropped since surgery. Labs from 03/04/19 show Hg at  9.8, MCV 76.3 and PLT 624, which supports iron deficiency.  -She currently onferrous sulfate65mg  1-2 tabs a day. Has been on for 1-2 month. She has increase iron in diet as well.  -Hg improved to 11.3 today (05/09/19), ferritin was still very low 3 weeks ago -I discussed her anemia has not completely responded to oral iron. I recommend 1 dose of IV iron for more direct correction. I reviewed side effect of possible allergy reactions, including anaphylactic reaction. She is agreeable.   4.Hypothyroidism -On NP Synthroid 90mg  managed by PCP -Her 04/17/19 TSH level at 6.944, I suggest her to f/u with PCP  -I will recheck at next visit.    PLAN: -Labs reviewed, Will proceed with IV Feraheme today, one dose only, she will continue oral iron  -Continue with Xeloda starting cycle 2 on 05/12/19, 2 weeks on and one week off.  -Lab and f/u in 3 weeks    No problem-specific Assessment & Plan notes found for this encounter.   No orders of the defined types were placed in this encounter.  All questions were answered. The patient knows to call the clinic with any problems, questions or concerns. No barriers to learning was detected. I spent 20 minutes counseling the patient face to face. The total time spent in the appointment was 25 minutes and more than 50% was on counseling and review of test results     [name], MD 05/09/2019   I, Joslyn Devon, am acting as scribe for [name], MD.   I have reviewed the above documentation for accuracy and completeness, and I agree with the above.

## 2019-05-09 ENCOUNTER — Encounter: Payer: Self-pay | Admitting: Hematology

## 2019-05-09 ENCOUNTER — Inpatient Hospital Stay: Payer: PPO

## 2019-05-09 ENCOUNTER — Other Ambulatory Visit: Payer: Self-pay

## 2019-05-09 ENCOUNTER — Inpatient Hospital Stay (HOSPITAL_BASED_OUTPATIENT_CLINIC_OR_DEPARTMENT_OTHER): Payer: PPO | Admitting: Hematology

## 2019-05-09 ENCOUNTER — Inpatient Hospital Stay: Payer: PPO | Attending: Hematology

## 2019-05-09 VITALS — BP 105/73 | HR 97 | Temp 98.2°F | Resp 18 | Ht 66.0 in | Wt 126.1 lb

## 2019-05-09 VITALS — BP 124/82 | HR 83 | Temp 97.6°F | Resp 18

## 2019-05-09 DIAGNOSIS — D649 Anemia, unspecified: Secondary | ICD-10-CM | POA: Insufficient documentation

## 2019-05-09 DIAGNOSIS — R634 Abnormal weight loss: Secondary | ICD-10-CM | POA: Diagnosis not present

## 2019-05-09 DIAGNOSIS — Z79899 Other long term (current) drug therapy: Secondary | ICD-10-CM | POA: Insufficient documentation

## 2019-05-09 DIAGNOSIS — C20 Malignant neoplasm of rectum: Secondary | ICD-10-CM

## 2019-05-09 DIAGNOSIS — D500 Iron deficiency anemia secondary to blood loss (chronic): Secondary | ICD-10-CM

## 2019-05-09 DIAGNOSIS — E039 Hypothyroidism, unspecified: Secondary | ICD-10-CM | POA: Diagnosis not present

## 2019-05-09 LAB — CMP (CANCER CENTER ONLY)
ALT: 11 U/L (ref 0–44)
AST: 18 U/L (ref 15–41)
Albumin: 3.7 g/dL (ref 3.5–5.0)
Alkaline Phosphatase: 72 U/L (ref 38–126)
Anion gap: 11 (ref 5–15)
BUN: 21 mg/dL (ref 8–23)
CO2: 24 mmol/L (ref 22–32)
Calcium: 9 mg/dL (ref 8.9–10.3)
Chloride: 104 mmol/L (ref 98–111)
Creatinine: 0.78 mg/dL (ref 0.44–1.00)
GFR, Est AFR Am: >60 mL/min (ref >60)
GFR, Estimated: >60 mL/min (ref >60)
Glucose, Bld: 89 mg/dL (ref 70–99)
Potassium: 4.1 mmol/L (ref 3.5–5.1)
Sodium: 139 mmol/L (ref 135–145)
Total Bilirubin: 0.4 mg/dL (ref 0.3–1.2)
Total Protein: 7.3 g/dL (ref 6.5–8.1)

## 2019-05-09 LAB — CBC WITH DIFFERENTIAL (CANCER CENTER ONLY)
Abs Immature Granulocytes: 0.04 10*3/uL (ref 0.00–0.07)
Basophils Absolute: 0 10*3/uL (ref 0.0–0.1)
Basophils Relative: 1 %
Eosinophils Absolute: 0.1 10*3/uL (ref 0.0–0.5)
Eosinophils Relative: 1 %
HCT: 37.7 % (ref 36.0–46.0)
Hemoglobin: 11.3 g/dL — ABNORMAL LOW (ref 12.0–15.0)
Immature Granulocytes: 1 %
Lymphocytes Relative: 40 %
Lymphs Abs: 2.6 10*3/uL (ref 0.7–4.0)
MCH: 25.1 pg — ABNORMAL LOW (ref 26.0–34.0)
MCHC: 30 g/dL (ref 30.0–36.0)
MCV: 83.8 fL (ref 80.0–100.0)
Monocytes Absolute: 0.6 10*3/uL (ref 0.1–1.0)
Monocytes Relative: 9 %
Neutro Abs: 3.2 10*3/uL (ref 1.7–7.7)
Neutrophils Relative %: 48 %
Platelet Count: 303 10*3/uL (ref 150–400)
RBC: 4.5 MIL/uL (ref 3.87–5.11)
RDW: 21.7 % — ABNORMAL HIGH (ref 11.5–15.5)
WBC Count: 6.6 10*3/uL (ref 4.0–10.5)
nRBC: 0 % (ref 0.0–0.2)

## 2019-05-09 MED ORDER — SODIUM CHLORIDE 0.9 % IV SOLN
Freq: Once | INTRAVENOUS | Status: AC
  Administered 2019-05-09: 12:00:00 via INTRAVENOUS
  Filled 2019-05-09: qty 250

## 2019-05-09 MED ORDER — SODIUM CHLORIDE 0.9 % IV SOLN
510.0000 mg | Freq: Once | INTRAVENOUS | Status: AC
  Administered 2019-05-09: 510 mg via INTRAVENOUS
  Filled 2019-05-09: qty 17

## 2019-05-09 MED FILL — CAPECITABINE 500 MG TABS: 500 | 21 days supply | Qty: 84 | Fill #0

## 2019-05-09 NOTE — Patient Instructions (Signed)

## 2019-05-12 ENCOUNTER — Telehealth: Payer: Self-pay | Admitting: Hematology

## 2019-05-12 NOTE — Telephone Encounter (Signed)
Scheduled appt per 6/5 los, was not able to get in contact with patient.

## 2019-05-13 ENCOUNTER — Telehealth: Payer: Self-pay | Admitting: Pharmacist

## 2019-05-13 NOTE — Telephone Encounter (Signed)
Oral Oncology Pharmacist Encounter  Received call from patient with questions about Xeloda (capecitabine) billing. Patient informed she has met her out of pocket on her Medicare Part B medical benefits and her capecitabine is being covered %100 by her insurance at this time. We had previously secured foundation copayment grant from [facility] for patient for $2700 and there is ~$2300 of funds remaining.  Patient states she started her 2nd cycle yesterday 05/12/2019 and remains on capecitabine 544m tablets, 3 tablets (15030m by mouth 2 times daily for 14 days on, 7 days off, repeated every 21 days for 8 planned cycles.  She states she experienced some fatigue and hair thinning with her 1st cycle but felt better during her week off.  All questions answered. Patient knows to call the office with any additional questions or concerns.  JeJohny DrillingPharmD, BCPS, BCOP  05/13/2019 3:39 PM Oral Oncology Clinic [phone]

## 2019-05-23 NOTE — Progress Notes (Signed)
[place]   [phone] [phone]   Clinic Follow up Note   Patient Care Team: [name], MD as PCP - General (Family Medicine) [name], MD as Consulting Physician (General Surgery) [name], MD as Consulting Physician (Radiation Oncology) [name], MD as Consulting Physician (Hematology)  Date of Service:  05/30/2019  CHIEF COMPLAINT: f/u of rectal cancer  SUMMARY OF ONCOLOGIC HISTORY: Oncology History Overview Note  Cancer Staging Rectal cancer [facility]) Staging form: Colon and Rectum, AJCC 8th Edition - Pathologic stage from 02/20/2019: Stage IIA (pT3, pN0, cM0) - Signed by [name], MD on 02/24/2019     Rectal cancer ([place])  02/08/2019 Imaging   CT AP W Contrast 02/08/19  IMPRESSION: 1. The rectum and sigmoid colon are markedly abnormal with wall thickening, somewhat masslike in appearance in some regions. There is also a region of stricture/narrowing resulting in partial obstruction with fecal loading throughout the remainder of the colon. The patient is at risk of developing a high-grade obstruction. The constellation of findings could be due to an underlying malignancy. A benign stricture with a superimposed colitis/proctitis is a possibility. An infectious or inflammatory colitis are also possibilities. Recommend consultation with gastroenterology. The patient will likely require direct visualization to determine the underlying etiology. Again, malignancy is not excluded. 2. Gastric distention. There is wall thickening in the region of the antrum just distal to the distension. Recommend direct visualization or upper GI. 3. No other significant abnormalities.   02/11/2019 Procedure   Flexible Sigmoidoscopy 02/11/19 by Dr. Benson Norway IMPRESSION -a malignant completely obstructing tumor in the recto-sigmoid colon - biopsied.   02/11/2019 Initial Biopsy   Colon, rectosigmoid mass biopsy --Invasive Adenocarcinoma,  moderately differentiated   02/17/2019 Imaging   CT Chest W Contrast 02/17/19  IMPRESSION: No evidence of metastatic disease in the chest.    02/20/2019 Imaging   CT CP W Contrast 02/20/19  IMPRESSION: 1. Pneumoperitoneum indicating a perforated loop of bowel. There appears to be a 4 cm extraluminal collection of gas and fluid central in the pelvis anterior to the sigmoid colon (series 2, image 68). This is in the region of distal large bowel tumor as described on 02/07/2019. Critical Value/emergent results were called by telephone at the time of interpretation on 02/20/2019 at 1933 hours to PA-C [name] , who verbally acknowledged these results. 2. Small volume of free fluid and free air scattered in the bilateral abdomen. Generalized mesenteric inflammation. 3. No metastatic disease identified.   02/20/2019 Surgery   EXPLORATORY LAPAROTOMY with LOWER ANTERIOR RESECTION WITH COLOSTOMY by Dr. [name]  02/20/19    02/20/2019 Cancer Staging   Staging form: Colon and Rectum, AJCC 8th Edition - Pathologic stage from 02/20/2019: Stage IIA (pT3, pN0, cM0) - Signed by [name], MD on 02/24/2019   02/20/2019 Pathology Results   Surgical Pathology  Diagnosis Colon, segmental resection for tumor, recto-sigmoid colon - INVASIVE ADENOCARCINOMA, MODERATELY DIFFERENTIATED, SPANNING 4 CM. - TUMOR INVOLVES PERICOLONIC SOFT TISSUE. - DIVERTICULITIS WITH ABSCESS. - SEROSITIS. - DISTAL RESECTION MARGIN IS POSITIVE FOR CARCINOMA. - FIFTEEN OF FIFTEEN LYMPH NODES NEGATIVE FOR CARCINOMA (0/15). - SEE ONCOLOGY TABLE.    02/24/2019 Initial Diagnosis   Rectal cancer ([place])   02/25/2019 Imaging   CT AP 02/25/19  IMPRESSION: 1. Postop changes from low anterior resection with left lower quadrant colostomy. Postop ileus. No evidence of abscess or bowel obstruction. 2. Increased diffuse body wall and mesenteric edema and small bilateral pleural effusions, consistent with 3rd  spacing.    Chemotherapy    Adjuvant Xeloda 1500mg  BID 2 weeks on and 1 week off, every 21 days PENDING to start on 04/21/19      CURRENT THERAPY:  AdjuvantXeloda 1500mg BID 2 weeks on and 1 week off,every 21 day startingon 04/21/19  INTERVAL HISTORY:  Marie Levy is here for a follow up of treatment. She presents to the clinic alone. She notes he is doing well. She is on her off week of Xeloda and will start next cycle on 06/04/19. She notes tolerating well overall with hair loss. She denies fever, nausea, diarrhea or cough. She saw Dr [name] 2 days ago and cauterized her incision to help it heal quicker. She is not using a dressing anymore. Wound less than an inch long and less than a 1/4 inch wide.  She notes she is eating well and gaining weight. She notes she is tolerating her colostomy bag well and has normal BMs/stool. She empties it once a day and fills in the morning.  She notes having to pay several hundred dollars still on her past CT scans even with her insurance included.      REVIEW OF SYSTEMS:   Constitutional: Denies fevers, chills or abnormal weight loss Eyes: Denies blurriness of vision Ears, nose, mouth, throat, and face: Denies mucositis or sore throat Respiratory: Denies cough, dyspnea or wheezes Cardiovascular: Denies palpitation, chest discomfort or lower extremity swelling Gastrointestinal:  Denies nausea, heartburn or change in bowel habits (+) abdominal wound is healing after cauterized, smaller than 1x0.25in Skin: Denies abnormal skin rashes (+) hair loss Lymphatics: Denies new lymphadenopathy or easy bruising Neurological:Denies numbness, tingling or new weaknesses Behavioral/Psych: Mood is stable, no new changes  All other systems were reviewed with the patient and are negative.  MEDICAL HISTORY:  Past Medical History:  Diagnosis Date  . Rectal cancer ([place])   . Thyroid disease     SURGICAL HISTORY: Past Surgical History:  Procedure Laterality Date  . COLECTOMY WITH  COLOSTOMY CREATION/HARTMANN PROCEDURE N/A 02/20/2019   Procedure: LOWER ANTERIOR RESECTION WITH COLOSTOMY;  Surgeon: [name], MD;  Location: [facility];  Service: General;  Laterality: N/A;  . LAPAROTOMY N/A 02/20/2019   Procedure: EXPLORATORY LAPAROTOMY;  Surgeon: [name], MD;  Location: [facility];  Service: General;  Laterality: N/A;  . TONSILLECTOMY      I have reviewed the social history and family history with the patient and they are unchanged from previous note.  ALLERGIES:  has No Known Allergies.  MEDICATIONS:  Current Outpatient Medications  Medication Sig Dispense Refill  . Ascorbic Acid (VITAMIN C) 100 MG tablet Take 100 mg by mouth daily.    . capecitabine (XELODA) 500 MG tablet Take 3 tablets (1,500 mg total) by mouth 2 (two) times daily after a meal. Take for 14 days on, 7 days off, repeat every 21 days. 84 tablet 3  . Digestive Enzyme CAPS Take 1 capsule by mouth daily.    . ferrous sulfate 325 (65 FE) MG tablet Take 27 mg by mouth daily with breakfast. From [employer]    . Flaxseed, Linseed, (FLAXSEED OIL PO) Take by mouth.    . Garlic 10 MG CAPS Take by mouth.    . magnesium 30 MG tablet Take 500 mg by mouth daily.     . NP THYROID 90 MG tablet Take 90 mg by mouth daily. Mon-Fri    . Selenium 100 MCG CAPS Take 1 capsule by mouth daily.    Marland Kitchen  UNABLE TO FIND daily. Med Name: [place]ville Haggard    . UNABLE TO FIND Take by mouth daily. Med Name: [place]    . UNABLE TO FIND Take by mouth daily. Med Name: HCL    . vitamin B-12 (CYANOCOBALAMIN) 500 MCG tablet Take 500 mcg by mouth daily.    Marland Kitchen VITAMIN D PO Take 30 mLs by mouth daily.    Marland Kitchen zinc gluconate 50 MG tablet Take 50 mg by mouth daily.     No current facility-administered medications for this visit.     PHYSICAL EXAMINATION: ECOG PERFORMANCE STATUS: 1 - Symptomatic but completely ambulatory  Vitals:   05/30/19 1253  BP: 120/80  Pulse: 95  Resp: 18  Temp: 98.7 F (37.1 C)  SpO2: 97%   Filed Weights    05/30/19 1253  Weight: 129 lb 9.6 oz (58.8 kg)    GENERAL:alert, no distress and comfortable SKIN: skin color, texture, turgor are normal, no rashes or significant lesions EYES: normal, Conjunctiva are pink and non-injected, sclera clear  NECK: supple, thyroid normal size, non-tender, without nodularity LYMPH:  no palpable lymphadenopathy in the cervical, axillary  LUNGS: clear to auscultation and percussion with normal breathing effort HEART: regular rate & rhythm and no murmurs and no lower extremity edema ABDOMEN:abdomen soft, non-tender and normal bowel sounds, surgical incision wound covered, per pt it's about 2-3X1cm Musculoskeletal:no cyanosis of digits and no clubbing  NEURO: alert & oriented x 3 with fluent speech, no focal motor/sensory deficits  LABORATORY DATA:  I have reviewed the data as listed CBC Latest Ref Rng & Units 05/30/2019 05/09/2019 04/17/2019  WBC 4.0 - 10.5 K/uL 7.0 6.6 8.1  Hemoglobin 12.0 - 15.0 g/dL 12.4 11.3(L) 10.3(L)  Hematocrit 36.0 - 46.0 % 39.6 37.7 34.7(L)  Platelets 150 - 400 K/uL 206 303 310     CMP Latest Ref Rng & Units 05/30/2019 05/09/2019 04/17/2019  Glucose 70 - 99 mg/dL 88 89 108(H)  BUN 8 - 23 mg/dL 20 21 16   Creatinine 0.44 - 1.00 mg/dL 0.84 0.78 0.89  Sodium 135 - 145 mmol/L 138 139 137  Potassium 3.5 - 5.1 mmol/L 4.3 4.1 4.0  Chloride 98 - 111 mmol/L 103 104 102  CO2 22 - 32 mmol/L 26 24 27   Calcium 8.9 - 10.3 mg/dL 8.9 9.0 9.2  Total Protein 6.5 - 8.1 g/dL 7.4 7.3 7.2  Total Bilirubin 0.3 - 1.2 mg/dL 0.7 0.4 0.4  Alkaline Phos 38 - 126 U/L 77 72 81  AST 15 - 41 U/L 23 18 22   ALT 0 - 44 U/L 19 11 18       RADIOGRAPHIC STUDIES: I have personally reviewed the radiological images as listed and agreed with the findings in the report. No results found.   ASSESSMENT & PLAN:  Marie Levy is a 73 y.o. female with   1. 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 She underwenturgentsurgical resection on 02/20/19 due to bowel perforation. Pathology shows moderately differentiated adenomacarcinoma, grade II, positivedistalmargins, 15nodes were allnegative. Her tumor was also found to be perforated.  -I discussed given her grade II disease, positive margins and perforation, she is at high risk of local and distant recurrence,especially peritoneal metastasis. -Dr. Raliegh Scarlet second surgery for positive margin, patient declined. -To reduce her risk of recurrence,she startedadjuvantXeloda1500mg  q12h, on day 1-14 every 21 dayson 04/21/19. She tolerated first cycle moderately well with mild hair loss, significant fatigue, abdominal soreness and bloating. This dissipated around week 2.  She tolerated cycle 2 better.  -She saw Dr [name] on 05/28/19 who cauterized her wound. Per patient the wound is now smaller and healing well -Labs reviewed, CBC and CMP WNL. Overall adequate to proceed with cycle 3 Xeloda starting 06/04/19. Plan for a total of 8 cycles.  -F/u in 3 weeks    2. Weight loss  -She was 145 at baseline.  -She lost about 20-30 pounds since onset of initial symptoms and liquid diet. Much improved after surgery. -She takes protein shakes. I encouraged her to continue and increase calorie and protein in diet. -She has been eating well and gaining weight back.Weight trending up.   3. Anemia, likely iron deficiency -secondary to #1 -Hg has dropped since surgery. Labs from 03/04/19 show Hg at 9.8, MCV 76.3 and PLT 624, which supports iron deficiency.  -She currently onferrous sulfate65mg  1-2 tabs a day. Has been on for 1-2 month. Her anemia was not completely responding to oral iron alone. She was given 1 dose IV Feraheme on 05/09/19.   -Hg normalized at 12.4 today (05/30/19)   4.Hypothyroidism -On NP Synthroid 90mg  managed by PCP -Her5/14/20TSH levelat 6.944, I suggest her to f/u with PCP -I will recheck at next visit.     PLAN: -She is clinically stable. Tolerating Xeloda well. Surgical wound is healing well -Labs reviewed, Will proceed with cycle 3 Xeloda starting 06/04/19 -lab and f/u in 3 weeks before cycle 4    No problem-specific Assessment & Plan notes found for this encounter.   No orders of the defined types were placed in this encounter.  All questions were answered. The patient knows to call the clinic with any problems, questions or concerns. No barriers to learning was detected. I spent 15 minutes counseling the patient face to face. The total time spent in the appointment was 20 minutes and more than 50% was on counseling and review of test results     [name], MD 05/30/2019   I, Joslyn Devon, am acting as scribe for [name], MD.   I have reviewed the above documentation for accuracy and completeness, and I agree with the above.

## 2019-05-28 DIAGNOSIS — C20 Malignant neoplasm of rectum: Secondary | ICD-10-CM | POA: Diagnosis not present

## 2019-05-30 ENCOUNTER — Inpatient Hospital Stay (HOSPITAL_BASED_OUTPATIENT_CLINIC_OR_DEPARTMENT_OTHER): Payer: PPO | Admitting: Hematology

## 2019-05-30 ENCOUNTER — Encounter: Payer: Self-pay | Admitting: Hematology

## 2019-05-30 ENCOUNTER — Inpatient Hospital Stay: Payer: PPO

## 2019-05-30 ENCOUNTER — Other Ambulatory Visit: Payer: Self-pay

## 2019-05-30 VITALS — BP 120/80 | HR 95 | Temp 98.7°F | Resp 18 | Ht 66.0 in | Wt 129.6 lb

## 2019-05-30 DIAGNOSIS — E039 Hypothyroidism, unspecified: Secondary | ICD-10-CM | POA: Diagnosis not present

## 2019-05-30 DIAGNOSIS — C20 Malignant neoplasm of rectum: Secondary | ICD-10-CM

## 2019-05-30 DIAGNOSIS — D649 Anemia, unspecified: Secondary | ICD-10-CM

## 2019-05-30 DIAGNOSIS — R634 Abnormal weight loss: Secondary | ICD-10-CM

## 2019-05-30 DIAGNOSIS — Z79899 Other long term (current) drug therapy: Secondary | ICD-10-CM

## 2019-05-30 LAB — CMP (CANCER CENTER ONLY)
ALT: 19 U/L (ref 0–44)
AST: 23 U/L (ref 15–41)
Albumin: 4 g/dL (ref 3.5–5.0)
Alkaline Phosphatase: 77 U/L (ref 38–126)
Anion gap: 9 (ref 5–15)
BUN: 20 mg/dL (ref 8–23)
CO2: 26 mmol/L (ref 22–32)
Calcium: 8.9 mg/dL (ref 8.9–10.3)
Chloride: 103 mmol/L (ref 98–111)
Creatinine: 0.84 mg/dL (ref 0.44–1.00)
GFR, Est AFR Am: >60 mL/min (ref >60)
GFR, Estimated: >60 mL/min (ref >60)
Glucose, Bld: 88 mg/dL (ref 70–99)
Potassium: 4.3 mmol/L (ref 3.5–5.1)
Sodium: 138 mmol/L (ref 135–145)
Total Bilirubin: 0.7 mg/dL (ref 0.3–1.2)
Total Protein: 7.4 g/dL (ref 6.5–8.1)

## 2019-05-30 LAB — CBC WITH DIFFERENTIAL (CANCER CENTER ONLY)
Abs Immature Granulocytes: 0.04 10*3/uL (ref 0.00–0.07)
Basophils Absolute: 0.1 10*3/uL (ref 0.0–0.1)
Basophils Relative: 1 %
Eosinophils Absolute: 0.1 10*3/uL (ref 0.0–0.5)
Eosinophils Relative: 1 %
HCT: 39.6 % (ref 36.0–46.0)
Hemoglobin: 12.4 g/dL (ref 12.0–15.0)
Immature Granulocytes: 1 %
Lymphocytes Relative: 39 %
Lymphs Abs: 2.8 10*3/uL (ref 0.7–4.0)
MCH: 27.7 pg (ref 26.0–34.0)
MCHC: 31.3 g/dL (ref 30.0–36.0)
MCV: 88.6 fL (ref 80.0–100.0)
Monocytes Absolute: 0.8 10*3/uL (ref 0.1–1.0)
Monocytes Relative: 12 %
Neutro Abs: 3.3 10*3/uL (ref 1.7–7.7)
Neutrophils Relative %: 46 %
Platelet Count: 206 10*3/uL (ref 150–400)
RBC: 4.47 MIL/uL (ref 3.87–5.11)
RDW: 25.9 % — ABNORMAL HIGH (ref 11.5–15.5)
WBC Count: 7 10*3/uL (ref 4.0–10.5)
nRBC: 0 % (ref 0.0–0.2)

## 2019-05-30 MED FILL — CAPECITABINE 500 MG TABS: 500 | 21 days supply | Qty: 84 | Fill #1

## 2019-06-02 ENCOUNTER — Telehealth: Payer: Self-pay | Admitting: Hematology

## 2019-06-02 NOTE — Telephone Encounter (Signed)
Patient wanted a late afternoon appt with [facility]. [facility] next late afternoon available appt was August 3rd and the patient was okay with it.  Patietnt aware of her appt date and time.  Scheduled appt per 6/26 los.

## 2019-06-23 MED FILL — CAPECITABINE 500 MG TABS: 500 | 21 days supply | Qty: 84 | Fill #2

## 2019-07-03 NOTE — Progress Notes (Signed)
[place]   [phone] [phone]   Clinic Follow up Note   Patient Care Team: [name], MD as PCP - General (Family Medicine) [name], MD as Consulting Physician (General Surgery) [name], MD as Consulting Physician (Radiation Oncology) [name], MD as Consulting Physician (Hematology)  Date of Service:  07/07/2019  CHIEF COMPLAINT: f/u of rectal cancer  SUMMARY OF ONCOLOGIC HISTORY: Oncology History Overview Note  Cancer Staging Rectal cancer [facility]) Staging form: Colon and Rectum, AJCC 8th Edition - Pathologic stage from 02/20/2019: Stage IIA (pT3, pN0, cM0) - Signed by [name], MD on 02/24/2019     Rectal cancer ([place])  02/08/2019 Imaging   CT AP W Contrast 02/08/19  IMPRESSION: 1. The rectum and sigmoid colon are markedly abnormal with wall thickening, somewhat masslike in appearance in some regions. There is also a region of stricture/narrowing resulting in partial obstruction with fecal loading throughout the remainder of the colon. The patient is at risk of developing a high-grade obstruction. The constellation of findings could be due to an underlying malignancy. A benign stricture with a superimposed colitis/proctitis is a possibility. An infectious or inflammatory colitis are also possibilities. Recommend consultation with gastroenterology. The patient will likely require direct visualization to determine the underlying etiology. Again, malignancy is not excluded. 2. Gastric distention. There is wall thickening in the region of the antrum just distal to the distension. Recommend direct visualization or upper GI. 3. No other significant abnormalities.   02/11/2019 Procedure   Flexible Sigmoidoscopy 02/11/19 by Dr. Benson Norway IMPRESSION -a malignant completely obstructing tumor in the recto-sigmoid colon - biopsied.   02/11/2019 Initial Biopsy   Colon, rectosigmoid mass biopsy --Invasive Adenocarcinoma,  moderately differentiated   02/17/2019 Imaging   CT Chest W Contrast 02/17/19  IMPRESSION: No evidence of metastatic disease in the chest.    02/20/2019 Imaging   CT CP W Contrast 02/20/19  IMPRESSION: 1. Pneumoperitoneum indicating a perforated loop of bowel. There appears to be a 4 cm extraluminal collection of gas and fluid central in the pelvis anterior to the sigmoid colon (series 2, image 68). This is in the region of distal large bowel tumor as described on 02/07/2019. Critical Value/emergent results were called by telephone at the time of interpretation on 02/20/2019 at 1933 hours to PA-C [name] , who verbally acknowledged these results. 2. Small volume of free fluid and free air scattered in the bilateral abdomen. Generalized mesenteric inflammation. 3. No metastatic disease identified.   02/20/2019 Surgery   EXPLORATORY LAPAROTOMY with LOWER ANTERIOR RESECTION WITH COLOSTOMY by Dr. [name]  02/20/19    02/20/2019 Cancer Staging   Staging form: Colon and Rectum, AJCC 8th Edition - Pathologic stage from 02/20/2019: Stage IIA (pT3, pN0, cM0) - Signed by [name], MD on 02/24/2019   02/20/2019 Pathology Results   Surgical Pathology  Diagnosis Colon, segmental resection for tumor, recto-sigmoid colon - INVASIVE ADENOCARCINOMA, MODERATELY DIFFERENTIATED, SPANNING 4 CM. - TUMOR INVOLVES PERICOLONIC SOFT TISSUE. - DIVERTICULITIS WITH ABSCESS. - SEROSITIS. - DISTAL RESECTION MARGIN IS POSITIVE FOR CARCINOMA. - FIFTEEN OF FIFTEEN LYMPH NODES NEGATIVE FOR CARCINOMA (0/15). - SEE ONCOLOGY TABLE.    02/24/2019 Initial Diagnosis   Rectal cancer ([place])   02/25/2019 Imaging   CT AP 02/25/19  IMPRESSION: 1. Postop changes from low anterior resection with left lower quadrant colostomy. Postop ileus. No evidence of abscess or bowel obstruction. 2. Increased diffuse body wall and mesenteric edema and small bilateral pleural effusions, consistent with 3rd  spacing.   04/21/2019 -   Chemotherapy   Adjuvant Xeloda 1500mg  BID 2 weeks on and 1 week off, every 21 days start on 04/21/19      CURRENT THERAPY:  AdjuvantXeloda 1500mg BID 2 weeks on and 1 week off,every 21 day startingon 04/21/19  INTERVAL HISTORY:  Marie Levy is here for a follow up. She presents to the clinic alone. She notes she is doing well. She is tolerating Xeloda well. She notes extra abdominal bloating. She has been sleeping 8 hours instead of 7 hours. She is still on 1500mg  BI 2 weeks on/1 week off. She will finish cycle 4 on week on 8/5. She plans to start cycle 5 on 8/12. She has not skin toxicities. Tolerating well overall. She feels her stomach feels more like a pouch since surgery. She denies issues with incision. She notes her BM are mostly controlled very well. She denies pain. She is tolerating stoma well. She previously had a rash around stoma but has resolved.  She notes she has been on oral iron. She is also on thyroid replacement. I reviewed her med list with her.    REVIEW OF SYSTEMS:   Constitutional: Denies fevers, chills or abnormal weight loss Eyes: Denies blurriness of vision Ears, nose, mouth, throat, and face: Denies mucositis or sore throat Respiratory: Denies cough, dyspnea or wheezes Cardiovascular: Denies palpitation, chest discomfort or lower extremity swelling Gastrointestinal:  Denies nausea, heartburn or change in bowel habits (+) abdominal bloating.  Skin: Denies abnormal skin rashes Lymphatics: Denies new lymphadenopathy or easy bruising Neurological:Denies numbness, tingling or new weaknesses Behavioral/Psych: Mood is stable, no new changes  All other systems were reviewed with the patient and are negative.  MEDICAL HISTORY:  Past Medical History:  Diagnosis Date  . Rectal cancer ([place])   . Thyroid disease     SURGICAL HISTORY: Past Surgical History:  Procedure Laterality Date  . COLECTOMY WITH COLOSTOMY CREATION/HARTMANN PROCEDURE N/A 02/20/2019    Procedure: LOWER ANTERIOR RESECTION WITH COLOSTOMY;  Surgeon: [name], MD;  Location: [facility];  Service: General;  Laterality: N/A;  . LAPAROTOMY N/A 02/20/2019   Procedure: EXPLORATORY LAPAROTOMY;  Surgeon: [name], MD;  Location: [facility];  Service: General;  Laterality: N/A;  . TONSILLECTOMY      I have reviewed the social history and family history with the patient and they are unchanged from previous note.  ALLERGIES:  has No Known Allergies.  MEDICATIONS:  Current Outpatient Medications  Medication Sig Dispense Refill  . Ascorbic Acid (VITAMIN C) 100 MG tablet Take 100 mg by mouth daily.    . capecitabine (XELODA) 500 MG tablet Take 3 tablets (1,500 mg total) by mouth 2 (two) times daily after a meal. Take for 14 days on, 7 days off, repeat every 21 days. 84 tablet 3  . Digestive Enzyme CAPS Take 1 capsule by mouth daily.    . ferrous sulfate 325 (65 FE) MG tablet Take 27 mg by mouth daily with breakfast. From [employer]    . Flaxseed, Linseed, (FLAXSEED OIL PO) Take by mouth.    . Garlic 10 MG CAPS Take by mouth.    . magnesium 30 MG tablet Take 500 mg by mouth daily.     . NP THYROID 90 MG tablet Take 90 mg by mouth daily. Mon-Fri    . Selenium 100 MCG CAPS Take 1 capsule by mouth daily.    Marland Kitchen UNABLE TO FIND daily. Med Name: Norville Haggard    .  UNABLE TO FIND Take by mouth daily. Med Name: [place]    . UNABLE TO FIND Take by mouth daily. Med Name: HCL    . VITAMIN D PO Take 30 mLs by mouth daily.    Marland Kitchen zinc gluconate 50 MG tablet Take 50 mg by mouth daily.     No current facility-administered medications for this visit.     PHYSICAL EXAMINATION: ECOG PERFORMANCE STATUS: 1 - Symptomatic but completely ambulatory  Vitals:   07/07/19 1302  BP: 121/75  Pulse: 84  Resp: 18  Temp: 98 F (36.7 C)  SpO2: 100%   Filed Weights   07/07/19 1302  Weight: 133 lb 12.8 oz (60.7 kg)    GENERAL:alert, no distress and comfortable SKIN: skin color, texture, turgor are  normal, no rashes or significant lesions EYES: normal, Conjunctiva are pink and non-injected, sclera clear  NECK: supple, thyroid normal size, non-tender, without nodularity LYMPH:  no palpable lymphadenopathy in the cervical, axillary  LUNGS: clear to auscultation and percussion with normal breathing effort HEART: regular rate & rhythm and no murmurs and no lower extremity edema ABDOMEN:abdomen soft, non-tender and normal bowel sounds (+) Midline surgical incision healed well, per patient picture she provided.  Musculoskeletal:no cyanosis of digits and no clubbing  NEURO: alert & oriented x 3 with fluent speech, no focal motor/sensory deficits  LABORATORY DATA:  I have reviewed the data as listed CBC Latest Ref Rng & Units 07/07/2019 05/30/2019 05/09/2019  WBC 4.0 - 10.5 K/uL 5.7 7.0 6.6  Hemoglobin 12.0 - 15.0 g/dL 13.3 12.4 11.3(L)  Hematocrit 36.0 - 46.0 % 41.6 39.6 37.7  Platelets 150 - 400 K/uL 164 206 303     CMP Latest Ref Rng & Units 07/07/2019 05/30/2019 05/09/2019  Glucose 70 - 99 mg/dL 124(H) 88 89  BUN 8 - 23 mg/dL 15 20 21   Creatinine 0.44 - 1.00 mg/dL 0.83 0.84 0.78  Sodium 135 - 145 mmol/L 139 138 139  Potassium 3.5 - 5.1 mmol/L 4.2 4.3 4.1  Chloride 98 - 111 mmol/L 103 103 104  CO2 22 - 32 mmol/L 27 26 24   Calcium 8.9 - 10.3 mg/dL 9.4 8.9 9.0  Total Protein 6.5 - 8.1 g/dL 7.1 7.4 7.3  Total Bilirubin 0.3 - 1.2 mg/dL 0.7 0.7 0.4  Alkaline Phos 38 - 126 U/L 78 77 72  AST 15 - 41 U/L 20 23 18   ALT 0 - 44 U/L 11 19 11       RADIOGRAPHIC STUDIES: I have personally reviewed the radiological images as listed and agreed with the findings in the report. No results found.   ASSESSMENT & PLAN:  Marie Levy is a 73 y.o. female with   1. 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 She underwenturgentsurgical resection on 02/20/19 due to bowel perforation. Pathology shows moderately differentiated  adenomacarcinoma, grade II, positivedistalmargins, 15nodes were allnegative. Her tumor was also found to be perforated.  -I discussed given her grade II disease, positive margins and perforation, she is at high risk of local and distant recurrence,especially peritoneal metastasis. -Dr. Raliegh Scarlet second surgery for positive margin, patient declined. -To reduce her risk of recurrence,she startedadjuvantXeloda1500mg  q12h, on day 1-14 every 21 dayson 04/21/19.She has been tolerating it well  -Her surgical wound is healed now  -She continues to tolerate Xeloda very well. Labs reviewed, CBC and CMP WNL. CEA and iron panel still pending. Continue Xeloda. She plans start cycle 5 on 07/16/19. Plan for 8 cycles.  -  She has repeatedly declined recommended adjuvant radiation.  -F/u week of 9/21 before cycle 7.     2. Anemia, likely iron deficiency -secondary to #1 -Hg has dropped since surgery. Labs from 03/04/19 show Hg at 9.8, MCV 76.3 and PLT 624, which supports iron deficiency.  -She currently onferrous sulfate65mg  1-2 tabs a day.Has been on for 1-63month. Her anemia was not completely responding to oral iron alone.She was given 1 dose IV Feraheme on 05/09/19.  -Hg normalized lately. Iron panel still pending today (07/07/19). Continue oral iron.    3.Hypothyroidism -On NP Synthroid 90mg  managed by PCP -Her5/14/20TSH levelat 6.944, I suggest her to f/u with PCP -I will recheck at next visit.   PLAN: -She is clinically stable. Tolerating Xeloda well, refilled today.  -Labs reviewed, Will proceed with cycle 5 Xeloda starting 07/16/19 -lab and f/u week of 9/21 before cycle 7   No problem-specific Assessment & Plan notes found for this encounter.   No orders of the defined types were placed in this encounter.  All questions were answered. The patient knows to call the clinic with any problems, questions or concerns. No barriers to learning was detected. I spent 15  minutes counseling the patient face to face. The total time spent in the appointment was 20 minutes and more than 50% was on counseling and review of test results     [name], MD 07/07/2019   I, Joslyn Devon, am acting as scribe for [name], MD.   I have reviewed the above documentation for accuracy and completeness, and I agree with the above.

## 2019-07-07 ENCOUNTER — Inpatient Hospital Stay (HOSPITAL_BASED_OUTPATIENT_CLINIC_OR_DEPARTMENT_OTHER): Payer: PPO | Admitting: Hematology

## 2019-07-07 ENCOUNTER — Other Ambulatory Visit: Payer: Self-pay

## 2019-07-07 ENCOUNTER — Telehealth: Payer: Self-pay | Admitting: Hematology

## 2019-07-07 ENCOUNTER — Encounter: Payer: Self-pay | Admitting: Hematology

## 2019-07-07 ENCOUNTER — Inpatient Hospital Stay: Payer: PPO | Attending: Hematology

## 2019-07-07 VITALS — BP 121/75 | HR 84 | Temp 98.0°F | Resp 18 | Ht 66.0 in | Wt 133.8 lb

## 2019-07-07 DIAGNOSIS — Z79899 Other long term (current) drug therapy: Secondary | ICD-10-CM | POA: Insufficient documentation

## 2019-07-07 DIAGNOSIS — Z9221 Personal history of antineoplastic chemotherapy: Secondary | ICD-10-CM | POA: Diagnosis not present

## 2019-07-07 DIAGNOSIS — Z933 Colostomy status: Secondary | ICD-10-CM | POA: Diagnosis not present

## 2019-07-07 DIAGNOSIS — E039 Hypothyroidism, unspecified: Secondary | ICD-10-CM | POA: Diagnosis not present

## 2019-07-07 DIAGNOSIS — C20 Malignant neoplasm of rectum: Secondary | ICD-10-CM

## 2019-07-07 DIAGNOSIS — D649 Anemia, unspecified: Secondary | ICD-10-CM | POA: Diagnosis not present

## 2019-07-07 LAB — CMP (CANCER CENTER ONLY)
ALT: 11 U/L (ref 0–44)
AST: 20 U/L (ref 15–41)
Albumin: 3.9 g/dL (ref 3.5–5.0)
Alkaline Phosphatase: 78 U/L (ref 38–126)
Anion gap: 9 (ref 5–15)
BUN: 15 mg/dL (ref 8–23)
CO2: 27 mmol/L (ref 22–32)
Calcium: 9.4 mg/dL (ref 8.9–10.3)
Chloride: 103 mmol/L (ref 98–111)
Creatinine: 0.83 mg/dL (ref 0.44–1.00)
GFR, Est AFR Am: >60 mL/min (ref >60)
GFR, Estimated: >60 mL/min (ref >60)
Glucose, Bld: 124 mg/dL — ABNORMAL HIGH (ref 70–99)
Potassium: 4.2 mmol/L (ref 3.5–5.1)
Sodium: 139 mmol/L (ref 135–145)
Total Bilirubin: 0.7 mg/dL (ref 0.3–1.2)
Total Protein: 7.1 g/dL (ref 6.5–8.1)

## 2019-07-07 LAB — CBC WITH DIFFERENTIAL (CANCER CENTER ONLY)
Abs Immature Granulocytes: 0.02 10*3/uL (ref 0.00–0.07)
Basophils Absolute: 0 10*3/uL (ref 0.0–0.1)
Basophils Relative: 1 %
Eosinophils Absolute: 0.1 10*3/uL (ref 0.0–0.5)
Eosinophils Relative: 2 %
HCT: 41.6 % (ref 36.0–46.0)
Hemoglobin: 13.3 g/dL (ref 12.0–15.0)
Immature Granulocytes: 0 %
Lymphocytes Relative: 45 %
Lymphs Abs: 2.6 10*3/uL (ref 0.7–4.0)
MCH: 30 pg (ref 26.0–34.0)
MCHC: 32 g/dL (ref 30.0–36.0)
MCV: 93.7 fL (ref 80.0–100.0)
Monocytes Absolute: 0.6 10*3/uL (ref 0.1–1.0)
Monocytes Relative: 10 %
Neutro Abs: 2.4 10*3/uL (ref 1.7–7.7)
Neutrophils Relative %: 42 %
Platelet Count: 164 10*3/uL (ref 150–400)
RBC: 4.44 MIL/uL (ref 3.87–5.11)
RDW: 24.9 % — ABNORMAL HIGH (ref 11.5–15.5)
WBC Count: 5.7 10*3/uL (ref 4.0–10.5)
nRBC: 0 % (ref 0.0–0.2)

## 2019-07-07 LAB — IRON AND TIBC
Iron: 117 ug/dL (ref 41–142)
Saturation Ratios: 29 % (ref 21–57)
TIBC: 401 ug/dL (ref 236–444)
UIBC: 284 ug/dL (ref 120–384)

## 2019-07-07 LAB — CEA (IN HOUSE-CHCC): CEA (CHCC-In House): 1.48 ng/mL (ref 0.00–5.00)

## 2019-07-07 LAB — FERRITIN: Ferritin: 67 ng/mL (ref 11–307)

## 2019-07-07 MED ORDER — CAPECITABINE 500 MG PO TABS: 1000.0000 mg/m2 | ORAL_TABLET | Freq: Two times a day (BID) | ORAL | 3 refills | Status: DC

## 2019-07-07 NOTE — Telephone Encounter (Signed)
Scheduled appt per 8/03 LOS - pt is aware of apt date and time. Per pt she does not need a print out , she will put appt in her phone.

## 2019-07-14 MED FILL — CAPECITABINE 500 MG TABS: 500 | 21 days supply | Qty: 84 | Fill #3

## 2019-07-30 DIAGNOSIS — E039 Hypothyroidism, unspecified: Secondary | ICD-10-CM | POA: Diagnosis not present

## 2019-07-30 DIAGNOSIS — C20 Malignant neoplasm of rectum: Secondary | ICD-10-CM | POA: Diagnosis not present

## 2019-08-01 DIAGNOSIS — Z933 Colostomy status: Secondary | ICD-10-CM | POA: Diagnosis not present

## 2019-08-04 MED FILL — CAPECITABINE 500 MG TABS: 500 | 21 days supply | Qty: 84 | Fill #0

## 2019-08-21 NOTE — Progress Notes (Signed)
[place]   [phone] [phone]   Clinic Follow up Note   Patient Care Team: [name], MD as PCP - General (Family Medicine) [name], MD as Consulting Physician (General Surgery) [name], MD as Consulting Physician (Radiation Oncology) [name], MD as Consulting Physician (Hematology)  Date of Service:  08/25/2019  CHIEF COMPLAINT: f/u of rectal cancer  SUMMARY OF ONCOLOGIC HISTORY: Oncology History Overview Note  Cancer Staging Rectal cancer [facility]) Staging form: Colon and Rectum, AJCC 8th Edition - Pathologic stage from 02/20/2019: Stage IIA (pT3, pN0, cM0) - Signed by [name], MD on 02/24/2019     Rectal cancer ([place])  02/08/2019 Imaging   CT AP W Contrast 02/08/19  IMPRESSION: 1. The rectum and sigmoid colon are markedly abnormal with wall thickening, somewhat masslike in appearance in some regions. There is also a region of stricture/narrowing resulting in partial obstruction with fecal loading throughout the remainder of the colon. The patient is at risk of developing a high-grade obstruction. The constellation of findings could be due to an underlying malignancy. A benign stricture with a superimposed colitis/proctitis is a possibility. An infectious or inflammatory colitis are also possibilities. Recommend consultation with gastroenterology. The patient will likely require direct visualization to determine the underlying etiology. Again, malignancy is not excluded. 2. Gastric distention. There is wall thickening in the region of the antrum just distal to the distension. Recommend direct visualization or upper GI. 3. No other significant abnormalities.   02/11/2019 Procedure   Flexible Sigmoidoscopy 02/11/19 by Dr. Benson Norway IMPRESSION -a malignant completely obstructing tumor in the recto-sigmoid colon - biopsied.   02/11/2019 Initial Biopsy   Colon, rectosigmoid mass biopsy --Invasive Adenocarcinoma,  moderately differentiated   02/17/2019 Imaging   CT Chest W Contrast 02/17/19  IMPRESSION: No evidence of metastatic disease in the chest.    02/20/2019 Imaging   CT CP W Contrast 02/20/19  IMPRESSION: 1. Pneumoperitoneum indicating a perforated loop of bowel. There appears to be a 4 cm extraluminal collection of gas and fluid central in the pelvis anterior to the sigmoid colon (series 2, image 68). This is in the region of distal large bowel tumor as described on 02/07/2019. Critical Value/emergent results were called by telephone at the time of interpretation on 02/20/2019 at 1933 hours to PA-C [name] , who verbally acknowledged these results. 2. Small volume of free fluid and free air scattered in the bilateral abdomen. Generalized mesenteric inflammation. 3. No metastatic disease identified.   02/20/2019 Surgery   EXPLORATORY LAPAROTOMY with LOWER ANTERIOR RESECTION WITH COLOSTOMY by Dr. [name]  02/20/19    02/20/2019 Cancer Staging   Staging form: Colon and Rectum, AJCC 8th Edition - Pathologic stage from 02/20/2019: Stage IIA (pT3, pN0, cM0) - Signed by [name], MD on 02/24/2019   02/20/2019 Pathology Results   Surgical Pathology  Diagnosis Colon, segmental resection for tumor, recto-sigmoid colon - INVASIVE ADENOCARCINOMA, MODERATELY DIFFERENTIATED, SPANNING 4 CM. - TUMOR INVOLVES PERICOLONIC SOFT TISSUE. - DIVERTICULITIS WITH ABSCESS. - SEROSITIS. - DISTAL RESECTION MARGIN IS POSITIVE FOR CARCINOMA. - FIFTEEN OF FIFTEEN LYMPH NODES NEGATIVE FOR CARCINOMA (0/15). - SEE ONCOLOGY TABLE.    02/24/2019 Initial Diagnosis   Rectal cancer ([place])   02/25/2019 Imaging   CT AP 02/25/19  IMPRESSION: 1. Postop changes from low anterior resection with left lower quadrant colostomy. Postop ileus. No evidence of abscess or bowel obstruction. 2. Increased diffuse body wall and mesenteric edema and small bilateral pleural effusions, consistent with 3rd  spacing.   04/21/2019 -   Chemotherapy   Adjuvant Xeloda 1500mg  BID 2 weeks on and 1 week off, every 21 days start on 04/21/19      CURRENT THERAPY:  AdjuvantXeloda 1500mg BID 2 weeks on and 1 week off,every 21 day startingon 04/21/19  INTERVAL HISTORY:  Marie Levy is here for a follow up and presents alone. She notes she is doing well. She notes she is being charged $433 dollar per prescription. She lost her part time job when [employer] started and she cannot afford this cost. She feels she will not be able to continue after this. She notes Pharmacist Evelena Peat found [phone] grant for her. She notes 20% of her total bill is out of pocket cost based on the bill she gets from her insurance. She notes she feels better off her chemo but it is still tolerable with fatigue and abdominal bloating. She denies GI issues. She only changed her colostomy bag once a day.     REVIEW OF SYSTEMS:   Constitutional: Denies fevers, chills or abnormal weight loss Eyes: Denies blurriness of vision Ears, nose, mouth, throat, and face: Denies mucositis or sore throat Respiratory: Denies cough, dyspnea or wheezes Cardiovascular: Denies palpitation, chest discomfort or lower extremity swelling Gastrointestinal:  Denies nausea, heartburn or change in bowel habits Skin: Denies abnormal skin rashes Lymphatics: Denies new lymphadenopathy or easy bruising Neurological:Denies numbness, tingling or new weaknesses Behavioral/Psych: Mood is stable, no new changes  All other systems were reviewed with the patient and are negative.  MEDICAL HISTORY:  Past Medical History:  Diagnosis Date  . Rectal cancer ([place])   . Thyroid disease     SURGICAL HISTORY: Past Surgical History:  Procedure Laterality Date  . COLECTOMY WITH COLOSTOMY CREATION/HARTMANN PROCEDURE N/A 02/20/2019   Procedure: LOWER ANTERIOR RESECTION WITH COLOSTOMY;  Surgeon: [name], MD;  Location: [facility];  Service: General;  Laterality: N/A;  . LAPAROTOMY N/A 02/20/2019    Procedure: EXPLORATORY LAPAROTOMY;  Surgeon: [name], MD;  Location: [facility];  Service: General;  Laterality: N/A;  . TONSILLECTOMY      I have reviewed the social history and family history with the patient and they are unchanged from previous note.  ALLERGIES:  has No Known Allergies.  MEDICATIONS:  Current Outpatient Medications  Medication Sig Dispense Refill  . Ascorbic Acid (VITAMIN C) 100 MG tablet Take 100 mg by mouth daily.    . capecitabine (XELODA) 500 MG tablet Take 3 tablets (1,500 mg total) by mouth 2 (two) times daily after a meal. Take for 14 days on, 7 days off, repeat every 21 days. 84 tablet 3  . Digestive Enzyme CAPS Take 1 capsule by mouth daily.    . ferrous sulfate 325 (65 FE) MG tablet Take 27 mg by mouth daily with breakfast. From [employer]    . Flaxseed, Linseed, (FLAXSEED OIL PO) Take by mouth.    . Garlic 10 MG CAPS Take by mouth.    . magnesium 30 MG tablet Take 500 mg by mouth daily.     . NP THYROID 90 MG tablet Take 90 mg by mouth daily. Mon-Fri    . Selenium 100 MCG CAPS Take 1 capsule by mouth daily.    Marland Kitchen UNABLE TO FIND daily. Med Name: Norville Haggard    . UNABLE TO FIND Take by mouth daily. Med Name: [place]    . UNABLE TO FIND Take by mouth daily. Med Name: HCL    . VITAMIN D  PO Take 30 mLs by mouth daily.    Marland Kitchen zinc gluconate 50 MG tablet Take 50 mg by mouth daily.     No current facility-administered medications for this visit.     PHYSICAL EXAMINATION: ECOG PERFORMANCE STATUS: 1 - Symptomatic but completely ambulatory  Vitals:   08/25/19 1441  BP: 130/87  Pulse: 82  Resp: 16  Temp: 97.8 F (36.6 C)  SpO2: 98%   Filed Weights   08/25/19 1441  Weight: 140 lb 9.6 oz (63.8 kg)    GENERAL:alert, no distress and comfortable SKIN: skin color, texture, turgor are normal, no rashes or significant lesions EYES: normal, Conjunctiva are pink and non-injected, sclera clear  NECK: supple, thyroid normal size, non-tender, without nodularity  LYMPH:  no palpable lymphadenopathy in the cervical, axillary  LUNGS: clear to auscultation and percussion with normal breathing effort HEART: regular rate & rhythm and no murmurs and no lower extremity edema ABDOMEN:abdomen soft, non-tender and normal bowel sounds Musculoskeletal:no cyanosis of digits and no clubbing  NEURO: alert & oriented x 3 with fluent speech, no focal motor/sensory deficits  LABORATORY DATA:  I have reviewed the data as listed CBC Latest Ref Rng & Units 08/25/2019 07/07/2019 05/30/2019  WBC 4.0 - 10.5 K/uL 6.7 5.7 7.0  Hemoglobin 12.0 - 15.0 g/dL 13.4 13.3 12.4  Hematocrit 36.0 - 46.0 % 41.3 41.6 39.6  Platelets 150 - 400 K/uL 180 164 206     CMP Latest Ref Rng & Units 08/25/2019 07/07/2019 05/30/2019  Glucose 70 - 99 mg/dL 90 124(H) 88  BUN 8 - 23 mg/dL 14 15 20   Creatinine 0.44 - 1.00 mg/dL 0.80 0.83 0.84  Sodium 135 - 145 mmol/L 138 139 138  Potassium 3.5 - 5.1 mmol/L 3.9 4.2 4.3  Chloride 98 - 111 mmol/L 104 103 103  CO2 22 - 32 mmol/L 26 27 26   Calcium 8.9 - 10.3 mg/dL 9.2 9.4 8.9  Total Protein 6.5 - 8.1 g/dL 7.0 7.1 7.4  Total Bilirubin 0.3 - 1.2 mg/dL 0.8 0.7 0.7  Alkaline Phos 38 - 126 U/L 78 78 77  AST 15 - 41 U/L 19 20 23   ALT 0 - 44 U/L 12 11 19       RADIOGRAPHIC STUDIES: I have personally reviewed the radiological images as listed and agreed with the findings in the report. No results found.   ASSESSMENT & PLAN:  Marie Levy is a 73 y.o. female with   1. 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 She underwenturgentsurgical resection on 02/20/19 due to bowel perforation. Pathology shows moderately differentiated adenomacarcinoma, grade II, positivedistalmargins, 15nodes were allnegative. Her tumor was also found to be perforated.  -I discussed given her grade II disease, positive margins and perforation, she is at high risk of local and distant  recurrence,especially peritoneal metastasis. -Dr. Raliegh Scarlet second surgery for positive margin, patient declined. -She has repeatedly declined recommended adjuvant radiation.  -To reduce her risk of recurrence,she startedadjuvantXeloda1500mg  q12h, on day 1-14 every 21 dayson 04/21/19.She has been tolerating it well with fatigue and abdominal bloating.  -Labs reviewed, CBC and CMP WNL except MCV 102.2. Latest CEA remains normal.  -she will start Xeloda C7 on 9/23. Plan for 8 cycles if she can afford it.  -I discussed the risk of cancer recurrence in the future. I discussed the surveillance plan, which is a physical exam and lab test (including CBC, CMP and CEA) every 3 months for the first 2 years, then  every 6-12 months, colonoscopy in one year, and surveillance CT scan every 6-12 month for up to 5 year.  -pt is reluctant to have CT scan due to radiation exposure. She prefer to delay next scan to 2021 -F/u 2 months. Plan to rescan her in early 2021 -She declined f/u shot today    2. Anemia, likely iron deficiency -secondary to #1 -Hg has dropped since surgery. Labs from 03/04/19 show Hg at 9.8, MCV 76.3 and PLT 624, which supports iron deficiency.  -She currently onferrous sulfate65mg  1-2 tabs a day.Has been on for 1-77month.Her anemia was not completely responding to oral iron alone.She was given 1 dose IV Feraheme on 05/09/19. -Hgnormalized lately. MCV 102.2 today (08/25/19). Continue oral iron.    3.Hypothyroidism -On NP Synthroid 90mg  managed by PCP -Her5/14/20TSH levelat 6.944, I suggest her to f/u with PCP -I will check TSH today (08/25/19)  4. Financial Support  -She received a grant for her Xeloda copay  -On her bill from [facility] she notes she is being charged 20% of her Xeloda bill which is $433 which she cannot afford.  -I encouraged her to speak with her financial advocate. I will reach out to Pharmacist Evelena Peat to help clarify  with her.    PLAN: -She is clinically stable. Tolerating Xeloda well -Labs reviewed, Will proceed with cycle 7 Xeloda on 9/23 -she she does not have copay for Xeloda, she is wiling to completed cycle 8  -Lab and f/u in 2 months    No problem-specific Assessment & Plan notes found for this encounter.   Orders Placed This Encounter  Procedures  . TSH    Standing Status:   Future    Number of Occurrences:   1    Standing Expiration Date:   08/24/2020   All questions were answered. The patient knows to call the clinic with any problems, questions or concerns. No barriers to learning was detected. I spent 15 minutes counseling the patient face to face. The total time spent in the appointment was 20 minutes and more than 50% was on counseling and review of test results     [name], MD 08/25/2019   I, Joslyn Devon, am acting as scribe for [name], MD.   I have reviewed the above documentation for accuracy and completeness, and I agree with the above.

## 2019-08-22 ENCOUNTER — Telehealth: Payer: Self-pay | Admitting: Hematology

## 2019-08-22 NOTE — Telephone Encounter (Signed)
Called patient regarding pre-screening questions before 09/21 appointment, left a voicemail.

## 2019-08-25 ENCOUNTER — Encounter: Payer: Self-pay | Admitting: Hematology

## 2019-08-25 ENCOUNTER — Inpatient Hospital Stay: Payer: PPO

## 2019-08-25 ENCOUNTER — Other Ambulatory Visit: Payer: Self-pay

## 2019-08-25 ENCOUNTER — Telehealth: Payer: Self-pay

## 2019-08-25 ENCOUNTER — Telehealth: Payer: Self-pay | Admitting: Hematology

## 2019-08-25 ENCOUNTER — Inpatient Hospital Stay: Payer: PPO | Attending: Hematology | Admitting: Hematology

## 2019-08-25 VITALS — BP 130/87 | HR 82 | Temp 97.8°F | Resp 16 | Wt 140.6 lb

## 2019-08-25 DIAGNOSIS — Z79899 Other long term (current) drug therapy: Secondary | ICD-10-CM | POA: Insufficient documentation

## 2019-08-25 DIAGNOSIS — C20 Malignant neoplasm of rectum: Secondary | ICD-10-CM

## 2019-08-25 DIAGNOSIS — Z933 Colostomy status: Secondary | ICD-10-CM | POA: Insufficient documentation

## 2019-08-25 DIAGNOSIS — E039 Hypothyroidism, unspecified: Secondary | ICD-10-CM

## 2019-08-25 DIAGNOSIS — D649 Anemia, unspecified: Secondary | ICD-10-CM | POA: Insufficient documentation

## 2019-08-25 LAB — CBC WITH DIFFERENTIAL (CANCER CENTER ONLY)
Abs Immature Granulocytes: 0.04 10*3/uL (ref 0.00–0.07)
Basophils Absolute: 0 10*3/uL (ref 0.0–0.1)
Basophils Relative: 0 %
Eosinophils Absolute: 0.1 10*3/uL (ref 0.0–0.5)
Eosinophils Relative: 1 %
HCT: 41.3 % (ref 36.0–46.0)
Hemoglobin: 13.4 g/dL (ref 12.0–15.0)
Immature Granulocytes: 1 %
Lymphocytes Relative: 50 %
Lymphs Abs: 3.4 10*3/uL (ref 0.7–4.0)
MCH: 33.2 pg (ref 26.0–34.0)
MCHC: 32.4 g/dL (ref 30.0–36.0)
MCV: 102.2 fL — ABNORMAL HIGH (ref 80.0–100.0)
Monocytes Absolute: 0.8 10*3/uL (ref 0.1–1.0)
Monocytes Relative: 12 %
Neutro Abs: 2.4 10*3/uL (ref 1.7–7.7)
Neutrophils Relative %: 36 %
Platelet Count: 180 10*3/uL (ref 150–400)
RBC: 4.04 MIL/uL (ref 3.87–5.11)
RDW: 18.4 % — ABNORMAL HIGH (ref 11.5–15.5)
WBC Count: 6.7 10*3/uL (ref 4.0–10.5)
nRBC: 0 % (ref 0.0–0.2)

## 2019-08-25 LAB — CMP (CANCER CENTER ONLY)
ALT: 12 U/L (ref 0–44)
AST: 19 U/L (ref 15–41)
Albumin: 4.1 g/dL (ref 3.5–5.0)
Alkaline Phosphatase: 78 U/L (ref 38–126)
Anion gap: 8 (ref 5–15)
BUN: 14 mg/dL (ref 8–23)
CO2: 26 mmol/L (ref 22–32)
Calcium: 9.2 mg/dL (ref 8.9–10.3)
Chloride: 104 mmol/L (ref 98–111)
Creatinine: 0.8 mg/dL (ref 0.44–1.00)
GFR, Est AFR Am: >60 mL/min (ref >60)
GFR, Estimated: >60 mL/min (ref >60)
Glucose, Bld: 90 mg/dL (ref 70–99)
Potassium: 3.9 mmol/L (ref 3.5–5.1)
Sodium: 138 mmol/L (ref 135–145)
Total Bilirubin: 0.8 mg/dL (ref 0.3–1.2)
Total Protein: 7 g/dL (ref 6.5–8.1)

## 2019-08-25 MED FILL — CAPECITABINE 500 MG TABS: 500 | 21 days supply | Qty: 84 | Fill #1

## 2019-08-25 NOTE — Telephone Encounter (Signed)
Oral Oncology Patient Advocate Encounter  I received notification that the patient has some concerns about billing at the pharmacy.  I called the patient to talk about her concerns and had to leave a voicemail.  [place] Patient [place] Phone [phone] Fax [phone] 08/25/2019   3:09 PM

## 2019-08-25 NOTE — Telephone Encounter (Signed)
Scheduled appt per 9/21 los.  Patient declined calendar and avs.

## 2019-08-26 ENCOUNTER — Telehealth: Payer: Self-pay | Admitting: *Deleted

## 2019-08-26 LAB — TSH: TSH: 27.041 u[IU]/mL — ABNORMAL HIGH (ref 0.308–3.960)

## 2019-08-26 NOTE — Telephone Encounter (Signed)
Oral Oncology Patient Advocate Encounter  The patient returned my call and stated that she received a statement from her insurance company that looks like she is paying a copay on her medicine. I explained to her that if she had a copay at the pharmacy then she would have to pay that copay before she could get her medicine. The copay that her insurance is saying she has is being billed to her grant and her grant is covering this copay leaving her with $0 to pay.  When I explained this to her she asked why is her bill going up at [facility] from her hospital stay. I informed her that she would need to call the billing department where she was treated and ask about her bill from her hospital stay, I have no way of knowing this information.  The patient verbalized understanding and great appreciation.  [place] Patient [place] Phone [phone] Fax [phone] 08/26/2019   12:12 PM

## 2019-08-26 NOTE — Telephone Encounter (Signed)
TCT patient regarding lab results from yesterday. Spoke with pt and advised that she is not on adequate thyroid hormone replacement. Advised that these results were fax'd to her PCP today.  She states she will follow up with her PCP

## 2019-08-26 NOTE — Telephone Encounter (Signed)
-----   Message from [name], MD sent at 08/26/2019  2:12 PM EDT ----- Please let pt know her TSH result, she is not on adequate thyroid hormone replacement, please send the result to her PCP, thanks   [name]  08/26/2019

## 2019-09-15 ENCOUNTER — Telehealth: Payer: Self-pay

## 2019-09-15 NOTE — Telephone Encounter (Signed)
I recommend her to take the last cycle when she returns from her trip, thanks   [name] MD

## 2019-09-15 NOTE — Telephone Encounter (Signed)
Patient calls stating she is due to start her last session of Xeloda on Wednesday.  She is having some skin issues, soreness and cracking of the nails/cuticles, cracking of skin on left foot. She has plans to go to the mountains this weekend and she is wondering if she decides to not take the last cycle of Xeloda. It usually makes her feel bad, bloated, tired. Or can she start if after getting back from the mountains?  [phone]

## 2019-09-15 NOTE — Telephone Encounter (Signed)
Spoke with patient regarding her last cycle of Xeloda.  Per Dr. Burr Medico informed her okay to start after her trip to the mountains, the patient verbalized an understanding and appreciated the call.

## 2019-09-16 MED FILL — CAPECITABINE 500 MG TABS: 500 | 21 days supply | Qty: 84 | Fill #2

## 2019-09-30 DIAGNOSIS — Z933 Colostomy status: Secondary | ICD-10-CM | POA: Diagnosis not present

## 2019-10-23 NOTE — Progress Notes (Signed)
[place]   [phone] [phone]   Clinic Follow up Note   Patient Care Team: [name], MD as PCP - General (Family Medicine) [name], MD as Consulting Physician (General Surgery) [name], MD as Consulting Physician (Radiation Oncology) [name], MD as Consulting Physician (Hematology)  Date of Service:  10/24/2019  CHIEF COMPLAINT: f/u of rectal cancer  SUMMARY OF ONCOLOGIC HISTORY: Oncology History Overview Note  Cancer Staging Rectal cancer [facility]) Staging form: Colon and Rectum, AJCC 8th Edition - Pathologic stage from 02/20/2019: Stage IIA (pT3, pN0, cM0) - Signed by [name], MD on 02/24/2019     Rectal cancer ([place])  02/08/2019 Imaging   CT AP W Contrast 02/08/19  IMPRESSION: 1. The rectum and sigmoid colon are markedly abnormal with wall thickening, somewhat masslike in appearance in some regions. There is also a region of stricture/narrowing resulting in partial obstruction with fecal loading throughout the remainder of the colon. The patient is at risk of developing a high-grade obstruction. The constellation of findings could be due to an underlying malignancy. A benign stricture with a superimposed colitis/proctitis is a possibility. An infectious or inflammatory colitis are also possibilities. Recommend consultation with gastroenterology. The patient will likely require direct visualization to determine the underlying etiology. Again, malignancy is not excluded. 2. Gastric distention. There is wall thickening in the region of the antrum just distal to the distension. Recommend direct visualization or upper GI. 3. No other significant abnormalities.   02/11/2019 Procedure   Flexible Sigmoidoscopy 02/11/19 by Dr. Benson Norway IMPRESSION -a malignant completely obstructing tumor in the recto-sigmoid colon - biopsied.   02/11/2019 Initial Biopsy   Colon, rectosigmoid mass biopsy --Invasive Adenocarcinoma,  moderately differentiated   02/17/2019 Imaging   CT Chest W Contrast 02/17/19  IMPRESSION: No evidence of metastatic disease in the chest.    02/20/2019 Imaging   CT CP W Contrast 02/20/19  IMPRESSION: 1. Pneumoperitoneum indicating a perforated loop of bowel. There appears to be a 4 cm extraluminal collection of gas and fluid central in the pelvis anterior to the sigmoid colon (series 2, image 68). This is in the region of distal large bowel tumor as described on 02/07/2019. Critical Value/emergent results were called by telephone at the time of interpretation on 02/20/2019 at 1933 hours to PA-C [name] , who verbally acknowledged these results. 2. Small volume of free fluid and free air scattered in the bilateral abdomen. Generalized mesenteric inflammation. 3. No metastatic disease identified.   02/20/2019 Surgery   EXPLORATORY LAPAROTOMY with LOWER ANTERIOR RESECTION WITH COLOSTOMY by Dr. [name]  02/20/19    02/20/2019 Cancer Staging   Staging form: Colon and Rectum, AJCC 8th Edition - Pathologic stage from 02/20/2019: Stage IIA (pT3, pN0, cM0) - Signed by [name], MD on 02/24/2019   02/20/2019 Pathology Results   Surgical Pathology  Diagnosis Colon, segmental resection for tumor, recto-sigmoid colon - INVASIVE ADENOCARCINOMA, MODERATELY DIFFERENTIATED, SPANNING 4 CM. - TUMOR INVOLVES PERICOLONIC SOFT TISSUE. - DIVERTICULITIS WITH ABSCESS. - SEROSITIS. - DISTAL RESECTION MARGIN IS POSITIVE FOR CARCINOMA. - FIFTEEN OF FIFTEEN LYMPH NODES NEGATIVE FOR CARCINOMA (0/15). - SEE ONCOLOGY TABLE.    02/24/2019 Initial Diagnosis   Rectal cancer ([place])   02/25/2019 Imaging   CT AP 02/25/19  IMPRESSION: 1. Postop changes from low anterior resection with left lower quadrant colostomy. Postop ileus. No evidence of abscess or bowel obstruction. 2. Increased diffuse body wall and mesenteric edema and small bilateral pleural effusions, consistent with 3rd  spacing.   04/21/2019 -   Chemotherapy   Adjuvant Xeloda 1500mg  BID 2 weeks on and 1 week off, every 21 day starting on 04/21/19 completed 8 cycles first week of November.       CURRENT THERAPY:  Surveillance   INTERVAL HISTORY:  Marie Levy is here for a follow up. She presents to the clinic alone. She notes she is doing well. She notes her last 8th dose of Xeloda was first week of November. She notes since off treatment she has more energy and doing yoga. She notes her insurance is not paying for the labs they usually cover. She brought the bill here. She notes her BMs are mostly normal but can be urgent. She notes during yoga she did have small amount of GI bleeding only once. This has not recurred since.     REVIEW OF SYSTEMS:   Constitutional: Denies fevers, chills or abnormal weight loss Eyes: Denies blurriness of vision Ears, nose, mouth, throat, and face: Denies mucositis or sore throat Respiratory: Denies cough, dyspnea or wheezes Cardiovascular: Denies palpitation, chest discomfort or lower extremity swelling Gastrointestinal:  Denies nausea, heartburn or change in bowel habits (+) Urgent BM Skin: Denies abnormal skin rashes Lymphatics: Denies new lymphadenopathy or easy bruising Neurological:Denies numbness, tingling or new weaknesses Behavioral/Psych: Mood is stable, no new changes  All other systems were reviewed with the patient and are negative.  MEDICAL HISTORY:  Past Medical History:  Diagnosis Date   Rectal cancer ([name])    Thyroid disease     SURGICAL HISTORY: Past Surgical History:  Procedure Laterality Date   COLECTOMY WITH COLOSTOMY CREATION/HARTMANN PROCEDURE N/A 02/20/2019   Procedure: LOWER ANTERIOR RESECTION WITH COLOSTOMY;  Surgeon: [name], MD;  Location: [facility];  Service: General;  Laterality: N/A;   LAPAROTOMY N/A 02/20/2019   Procedure: EXPLORATORY LAPAROTOMY;  Surgeon: [name], MD;  Location: [facility];  Service: General;  Laterality: N/A;    TONSILLECTOMY      I have reviewed the social history and family history with the patient and they are unchanged from previous note.  ALLERGIES:  has No Known Allergies.  MEDICATIONS:  Current Outpatient Medications  Medication Sig Dispense Refill   Ascorbic Acid (VITAMIN C) 100 MG tablet Take 100 mg by mouth daily.     capecitabine (XELODA) 500 MG tablet Take 3 tablets (1,500 mg total) by mouth 2 (two) times daily after a meal. Take for 14 days on, 7 days off, repeat every 21 days. 84 tablet 3   Digestive Enzyme CAPS Take 1 capsule by mouth daily.     ELDERBERRY PO Take by mouth.     ferrous sulfate 325 (65 FE) MG tablet Take 27 mg by mouth daily with breakfast. From [employer]     Flaxseed, Linseed, (FLAXSEED OIL PO) Take by mouth.     Garlic 10 MG CAPS Take by mouth.     magnesium 30 MG tablet Take 500 mg by mouth daily.      NP THYROID 90 MG tablet Take 90 mg by mouth daily. Mon-Fri     Selenium 100 MCG CAPS Take 1 capsule by mouth daily.     UNABLE TO FIND daily. Med Name: Tumero     UNABLE TO FIND Take by mouth daily. Med Name: Aseo     UNABLE TO FIND Take by mouth daily. Med Name: HCL     VITAMIN D PO Take 30 mLs by mouth daily.     zinc  gluconate 50 MG tablet Take 50 mg by mouth daily.     No current facility-administered medications for this visit.     PHYSICAL EXAMINATION: ECOG PERFORMANCE STATUS: 0 - Asymptomatic  Vitals:   10/24/19 1300  BP: 129/79  Pulse: 97  Resp: 18  Temp: 98.3 F (36.8 C)  SpO2: 98%   Filed Weights   10/24/19 1300  Weight: 141 lb 1.6 oz (64 kg)    GENERAL:alert, no distress and comfortable SKIN: skin color, texture, turgor are normal, no rashes or significant lesions EYES: normal, Conjunctiva are pink and non-injected, sclera clear  NECK: supple, thyroid normal size, non-tender, without nodularity LYMPH:  no palpable lymphadenopathy in the cervical, axillary  LUNGS: clear to auscultation and percussion with normal  breathing effort HEART: regular rate & rhythm and no murmurs and no lower extremity edema ABDOMEN:abdomen soft, non-tender and normal bowel sounds (+) colostomy bag at left lower abdomen, clean  Musculoskeletal:no cyanosis of digits and no clubbing  NEURO: alert & oriented x 3 with fluent speech, no focal motor/sensory deficits RECTAL: No palpable mass. No blood on glove. Black secretion present. Benign exam    LABORATORY DATA:  I have reviewed the data as listed CBC Latest Ref Rng & Units 10/24/2019 08/25/2019 07/07/2019  WBC 4.0 - 10.5 K/uL 5.9 6.7 5.7  Hemoglobin 12.0 - 15.0 g/dL 13.9 13.4 13.3  Hematocrit 36.0 - 46.0 % 42.8 41.3 41.6  Platelets 150 - 400 K/uL 185 180 164     CMP Latest Ref Rng & Units 10/24/2019 08/25/2019 07/07/2019  Glucose 70 - 99 mg/dL 79 90 124(H)  BUN 8 - 23 mg/dL 15 14 15   Creatinine 0.44 - 1.00 mg/dL 0.80 0.80 0.83  Sodium 135 - 145 mmol/L 140 138 139  Potassium 3.5 - 5.1 mmol/L 3.7 3.9 4.2  Chloride 98 - 111 mmol/L 103 104 103  CO2 22 - 32 mmol/L 26 26 27   Calcium 8.9 - 10.3 mg/dL 9.5 9.2 9.4  Total Protein 6.5 - 8.1 g/dL 7.5 7.0 7.1  Total Bilirubin 0.3 - 1.2 mg/dL 0.7 0.8 0.7  Alkaline Phos 38 - 126 U/L 93 78 78  AST 15 - 41 U/L 24 19 20   ALT 0 - 44 U/L 19 12 11       RADIOGRAPHIC STUDIES: I have personally reviewed the radiological images as listed and agreed with the findings in the report. No results found.   ASSESSMENT & PLAN:  Marie Levy is a 74 y.o. female with   1. 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 She underwenturgentsurgical resection on 02/20/19 due to bowel perforation. Pathology shows moderately differentiated adenomacarcinoma, grade II, positivedistalmargins, 15nodes were allnegative. Her tumor was also found to be perforated.  -I discussed given her grade II disease, positive margins and perforation, she is at high risk of local and  distant recurrence,especially peritoneal metastasis. -Dr. Raliegh Scarlet second surgery for positive margin, patient declined. -She has repeatedly declined recommended adjuvant radiation. -To reduce her risk of recurrence, she completed 8 cycles of adjuvant Xeloda in early November.  -She is recovering well from treatment. Her energy has improved. She has watery stool which can be urgent at times. Colostomy reversal may be considered by Dr. [name] down the road.  -Labs reviewed, CBC and CMP WNL. CEA and Iron panel still pending. Physical and rectal exam benign today.  -I discussed the risk of cancer recurrence in the future. I discussed the surveillance plan, which is a  physical exam and lab test (including CBC, CMP and CEA) every 3 months for the first 2 years, then every 6-12 months, colonoscopy in 04/2020, and surveillance CT scan every 6-12 month for up to 5 year starting 01/2020.  -she will be due for colonoscopy in March 2021 -F/u in 3 months with scan. I encouraged her to continue to f/u with her PCP, maintaining her health and continue cancer screenings.     2. Anemia, iron deficiency -secondary to #1 and dropped after surgery. Labs supported iron deficiency.  -She currently onferrous sulfate65mg  1-2 tabs a day. She was given 1 dose IV Feraheme on 05/09/19. -Hgnormalizedlately. Iron panel still pending today. Continue oral iron.  3.Hypothyroidism -On NP Synthroid 90mg  managed by PCP -Her5/14/20TSH levelat 6.944 and increased to 27/041 in 08/2019. I previously suggested she f/u with PCP  4. Watery stool with mild urgency, secondary to surgery   -She has watery stool which can be urgent at times. She is tolerating colostomy well.  -I recommend she use imodium to keep diarrhea controlled, but to avoid constipation.     PLAN: -F/u in 3 months with lab and CT CAP a few days before  -she will f/u with Dr. [name] in the interim    No problem-specific [place] notes found for this encounter.   Orders Placed This Encounter  Procedures   CT Abdomen Pelvis W Contrast    Standing Status:   Future    Standing Expiration Date:   10/23/2020    Order Specific Question:   If indicated for the ordered procedure, I authorize the administration of contrast media per Radiology protocol    Answer:   Yes    Order Specific Question:   Preferred imaging location?    Answer:   [facility]    Order Specific Question:   Is Oral Contrast requested for this exam?    Answer:   Yes, Per Radiology protocol    Order Specific Question:   Radiology Contrast Protocol - do NOT remove file path    Answer:   [email or web address]   CT Chest W Contrast    Standing Status:   Future    Standing Expiration Date:   10/23/2020    Order Specific Question:   If indicated for the ordered procedure, I authorize the administration of contrast media per Radiology protocol    Answer:   Yes    Order Specific Question:   Preferred imaging location?    Answer:   [facility]    Order Specific Question:   Radiology Contrast Protocol - do NOT remove file path    Answer:   [email or web address]   All questions were answered. The patient knows to call the clinic with any problems, questions or concerns. No barriers to learning was detected. I spent 20 minutes counseling the patient face to face. The total time spent in the appointment was 25 minutes and more than 50% was on counseling and review of test results     [name], MD 10/24/2019   I, Joslyn Devon, am acting as scribe for [name], MD.   I have reviewed the above documentation for accuracy and completeness, and I agree with the above.

## 2019-10-24 ENCOUNTER — Telehealth: Payer: Self-pay | Admitting: Hematology

## 2019-10-24 ENCOUNTER — Encounter: Payer: Self-pay | Admitting: Hematology

## 2019-10-24 ENCOUNTER — Inpatient Hospital Stay: Payer: PPO | Attending: Hematology

## 2019-10-24 ENCOUNTER — Other Ambulatory Visit: Payer: Self-pay

## 2019-10-24 ENCOUNTER — Inpatient Hospital Stay (HOSPITAL_BASED_OUTPATIENT_CLINIC_OR_DEPARTMENT_OTHER): Payer: PPO | Admitting: Hematology

## 2019-10-24 VITALS — BP 129/79 | HR 97 | Temp 98.3°F | Resp 18 | Ht 66.0 in | Wt 141.1 lb

## 2019-10-24 DIAGNOSIS — Z933 Colostomy status: Secondary | ICD-10-CM | POA: Insufficient documentation

## 2019-10-24 DIAGNOSIS — C19 Malignant neoplasm of rectosigmoid junction: Secondary | ICD-10-CM | POA: Diagnosis not present

## 2019-10-24 DIAGNOSIS — Z79899 Other long term (current) drug therapy: Secondary | ICD-10-CM | POA: Diagnosis not present

## 2019-10-24 DIAGNOSIS — C20 Malignant neoplasm of rectum: Secondary | ICD-10-CM | POA: Diagnosis not present

## 2019-10-24 DIAGNOSIS — R197 Diarrhea, unspecified: Secondary | ICD-10-CM | POA: Insufficient documentation

## 2019-10-24 DIAGNOSIS — E039 Hypothyroidism, unspecified: Secondary | ICD-10-CM | POA: Insufficient documentation

## 2019-10-24 DIAGNOSIS — D509 Iron deficiency anemia, unspecified: Secondary | ICD-10-CM | POA: Insufficient documentation

## 2019-10-24 DIAGNOSIS — Z9049 Acquired absence of other specified parts of digestive tract: Secondary | ICD-10-CM | POA: Insufficient documentation

## 2019-10-24 LAB — IRON AND TIBC
Iron: 82 ug/dL (ref 41–142)
Saturation Ratios: 19 % — ABNORMAL LOW (ref 21–57)
TIBC: 424 ug/dL (ref 236–444)
UIBC: 342 ug/dL (ref 120–384)

## 2019-10-24 LAB — CMP (CANCER CENTER ONLY)
ALT: 19 U/L (ref 0–44)
AST: 24 U/L (ref 15–41)
Albumin: 4.2 g/dL (ref 3.5–5.0)
Alkaline Phosphatase: 93 U/L (ref 38–126)
Anion gap: 11 (ref 5–15)
BUN: 15 mg/dL (ref 8–23)
CO2: 26 mmol/L (ref 22–32)
Calcium: 9.5 mg/dL (ref 8.9–10.3)
Chloride: 103 mmol/L (ref 98–111)
Creatinine: 0.8 mg/dL (ref 0.44–1.00)
GFR, Est AFR Am: >60 mL/min (ref >60)
GFR, Estimated: >60 mL/min (ref >60)
Glucose, Bld: 79 mg/dL (ref 70–99)
Potassium: 3.7 mmol/L (ref 3.5–5.1)
Sodium: 140 mmol/L (ref 135–145)
Total Bilirubin: 0.7 mg/dL (ref 0.3–1.2)
Total Protein: 7.5 g/dL (ref 6.5–8.1)

## 2019-10-24 LAB — CBC WITH DIFFERENTIAL (CANCER CENTER ONLY)
Abs Immature Granulocytes: 0.02 10*3/uL (ref 0.00–0.07)
Basophils Absolute: 0.1 10*3/uL (ref 0.0–0.1)
Basophils Relative: 1 %
Eosinophils Absolute: 0.1 10*3/uL (ref 0.0–0.5)
Eosinophils Relative: 1 %
HCT: 42.8 % (ref 36.0–46.0)
Hemoglobin: 13.9 g/dL (ref 12.0–15.0)
Immature Granulocytes: 0 %
Lymphocytes Relative: 33 %
Lymphs Abs: 1.9 10*3/uL (ref 0.7–4.0)
MCH: 33.3 pg (ref 26.0–34.0)
MCHC: 32.5 g/dL (ref 30.0–36.0)
MCV: 102.6 fL — ABNORMAL HIGH (ref 80.0–100.0)
Monocytes Absolute: 0.7 10*3/uL (ref 0.1–1.0)
Monocytes Relative: 12 %
Neutro Abs: 3.1 10*3/uL (ref 1.7–7.7)
Neutrophils Relative %: 53 %
Platelet Count: 185 10*3/uL (ref 150–400)
RBC: 4.17 MIL/uL (ref 3.87–5.11)
RDW: 14.6 % (ref 11.5–15.5)
WBC Count: 5.9 10*3/uL (ref 4.0–10.5)
nRBC: 0 % (ref 0.0–0.2)

## 2019-10-24 LAB — CEA (IN HOUSE-CHCC): CEA (CHCC-In House): 1.27 ng/mL (ref 0.00–5.00)

## 2019-10-24 LAB — FERRITIN: Ferritin: 66 ng/mL (ref 11–307)

## 2019-10-24 NOTE — Telephone Encounter (Signed)
Scheduled per 11/20 los, patient is notified of upcoming appointments.

## 2019-10-27 ENCOUNTER — Encounter: Payer: Self-pay | Admitting: Hematology

## 2020-01-09 DIAGNOSIS — Z933 Colostomy status: Secondary | ICD-10-CM | POA: Diagnosis not present

## 2020-01-21 NOTE — Progress Notes (Signed)
Marie Levy   Telephone:([phone]) [phone] Fax:([phone]) [phone]   Clinic Follow up Note   Patient Care Team: [name], MD as PCP - General (Family Medicine) [name], MD as Consulting Physician (General Surgery) [name], MD as Consulting Physician (Radiation Oncology) [name], MD as Consulting Physician (Hematology)  Date of Service:  01/26/2020  CHIEF COMPLAINT: f/u of rectal cancer  SUMMARY OF ONCOLOGIC HISTORY: Oncology History Overview Note  Cancer Staging Rectal cancer [facility]) Staging form: Colon and Rectum, AJCC 8th Edition - Pathologic stage from 02/20/2019: Stage IIA (pT3, pN0, cM0) - Signed by [name], MD on 02/24/2019     Rectal cancer ([place])  02/08/2019 Imaging   CT AP W Contrast 02/08/19  IMPRESSION: 1. The rectum and sigmoid colon are markedly abnormal with wall thickening, somewhat masslike in appearance in some regions. There is also a region of stricture/narrowing resulting in partial obstruction with fecal loading throughout the remainder of the colon. The patient is at risk of developing a high-grade obstruction. The constellation of findings could be due to an underlying malignancy. A benign stricture with a superimposed colitis/proctitis is a possibility. An infectious or inflammatory colitis are also possibilities. Recommend consultation with gastroenterology. The patient will likely require direct visualization to determine the underlying etiology. Again, malignancy is not excluded. 2. Gastric distention. There is wall thickening in the region of the antrum just distal to the distension. Recommend direct visualization or upper GI. 3. No other significant abnormalities.   02/11/2019 Procedure   Flexible Sigmoidoscopy 02/11/19 by Dr. Benson Norway IMPRESSION -a malignant completely obstructing tumor in the recto-sigmoid colon - biopsied.   02/11/2019 Initial Biopsy   Colon, rectosigmoid mass biopsy --Invasive Adenocarcinoma,  moderately differentiated   02/17/2019 Imaging   CT Chest W Contrast 02/17/19  IMPRESSION: No evidence of metastatic disease in the chest.    02/20/2019 Imaging   CT CP W Contrast 02/20/19  IMPRESSION: 1. Pneumoperitoneum indicating a perforated loop of bowel. There appears to be a 4 cm extraluminal collection of gas and fluid central in the pelvis anterior to the sigmoid colon (series 2, image 68). This is in the region of distal large bowel tumor as described on 02/07/2019. Critical Value/emergent results were called by telephone at the time of interpretation on 02/20/2019 at 1933 hours to PA-C [name] , who verbally acknowledged these results. 2. Small volume of free fluid and free air scattered in the bilateral abdomen. Generalized mesenteric inflammation. 3. No metastatic disease identified.   02/20/2019 Surgery   EXPLORATORY LAPAROTOMY with LOWER ANTERIOR RESECTION WITH COLOSTOMY by Dr. [name]  02/20/19    02/20/2019 Cancer Staging   Staging form: Colon and Rectum, AJCC 8th Edition - Pathologic stage from 02/20/2019: Stage IIA (pT3, pN0, cM0) - Signed by [name], MD on 02/24/2019   02/20/2019 Pathology Results   Surgical Pathology  Diagnosis Colon, segmental resection for tumor, recto-sigmoid colon - INVASIVE ADENOCARCINOMA, MODERATELY DIFFERENTIATED, SPANNING 4 CM. - TUMOR INVOLVES PERICOLONIC SOFT TISSUE. - DIVERTICULITIS WITH ABSCESS. - SEROSITIS. - DISTAL RESECTION MARGIN IS POSITIVE FOR CARCINOMA. - FIFTEEN OF FIFTEEN LYMPH NODES NEGATIVE FOR CARCINOMA (0/15). - SEE ONCOLOGY TABLE.    02/24/2019 Initial Diagnosis   Rectal cancer ([place])   02/25/2019 Imaging   CT AP 02/25/19  IMPRESSION: 1. Postop changes from low anterior resection with left lower quadrant colostomy. Postop ileus. No evidence of abscess or bowel obstruction. 2. Increased diffuse body wall and mesenteric edema and small bilateral pleural effusions, consistent with 3rd  spacing.   04/21/2019 -   Chemotherapy   Adjuvant Xeloda 1500mg  BID 2 weeks on and 1 week off, every 21 day starting on 04/21/19 completed 8 cycles first week of November.    01/23/2020 Imaging   CT CAP W contrast  IMPRESSION: No acute findings. No evidence of recurrent or metastatic carcinoma, or other significant abnormality.      CURRENT THERAPY:  Surveillance   INTERVAL HISTORY:  Marie Levy is here for a follow up of rectal cancer. She presents to the clinic alone. She notes she is doing well. She does have rectal urgency but only liquid will come out. She will occasionally have blood with this. She feels this is related to hemorrhoids. She denies any issues with her ostomy bag. She notes her stool output is never hard. She notes she has colonoscopy in 02/2020. She denies any new pain or stomach issues. She notes she feels she wants to sleep more. She feels through the day she can do what she wants to do. She is not interested in COVID19 vaccine.     REVIEW OF SYSTEMS:   Constitutional: Denies fevers, chills or abnormal weight loss (+) fatigue  Eyes: Denies blurriness of vision Ears, nose, mouth, throat, and face: Denies mucositis or sore throat Respiratory: Denies cough, dyspnea or wheezes Cardiovascular: Denies palpitation, chest discomfort or lower extremity swelling Gastrointestinal:  Denies nausea, heartburn (+) Rectal discharge with occasional bleeding.  Skin: Denies abnormal skin rashes Lymphatics: Denies new lymphadenopathy or easy bruising Neurological:Denies numbness, tingling or new weaknesses Behavioral/Psych: Mood is stable, no new changes  All other systems were reviewed with the patient and are negative.  MEDICAL HISTORY:  Past Medical History:  Diagnosis Date  . Rectal cancer ([place]) dx'd 02/2019  . Thyroid disease     SURGICAL HISTORY: Past Surgical History:  Procedure Laterality Date  . COLECTOMY WITH COLOSTOMY CREATION/HARTMANN PROCEDURE N/A 02/20/2019   Procedure: LOWER  ANTERIOR RESECTION WITH COLOSTOMY;  Surgeon: [name], MD;  Location: [facility];  Service: General;  Laterality: N/A;  . LAPAROTOMY N/A 02/20/2019   Procedure: EXPLORATORY LAPAROTOMY;  Surgeon: [name], MD;  Location: [facility];  Service: General;  Laterality: N/A;  . TONSILLECTOMY      I have reviewed the social history and family history with the patient and they are unchanged from previous note.  ALLERGIES:  has No Known Allergies.  MEDICATIONS:  Current Outpatient Medications  Medication Sig Dispense Refill  . Ascorbic Acid (VITAMIN C) 100 MG tablet Take 100 mg by mouth daily.    . Digestive Enzyme CAPS Take 1 capsule by mouth daily.    Marland Kitchen ELDERBERRY PO Take by mouth.    . ferrous sulfate 325 (65 FE) MG tablet Take 27 mg by mouth daily with breakfast. From [facility]    . Flaxseed, Linseed, (FLAXSEED OIL PO) Take by mouth.    . Garlic 10 MG CAPS Take by mouth.    . magnesium 30 MG tablet Take 500 mg by mouth daily.     . NP THYROID 90 MG tablet Take 90 mg by mouth daily. Mon-Fri    . Selenium 100 MCG CAPS Take 1 capsule by mouth daily.    Marland Kitchen UNABLE TO FIND daily. Med Name: [place] Haggard    . UNABLE TO FIND Take by mouth daily. Med Name: [place]    . UNABLE TO FIND Take by mouth daily. Med Name: HCL    . VITAMIN D PO Take 30 mLs by  mouth daily.    Marland Kitchen zinc gluconate 50 MG tablet Take 50 mg by mouth daily.     No current facility-administered medications for this visit.    PHYSICAL EXAMINATION: ECOG PERFORMANCE STATUS: 0 - Asymptomatic  Vitals:   01/26/20 1327  BP: 131/81  Pulse: 91  Resp: 19  Temp: 97.9 F (36.6 C)  SpO2: 98%   Filed Weights   01/26/20 1327  Weight: 146 lb (66.2 kg)    GENERAL:alert, no distress and comfortable SKIN: skin color, texture, turgor are normal, no rashes or significant lesions EYES: normal, Conjunctiva are pink and non-injected, sclera clear  NECK: supple, thyroid normal size, non-tender, without nodularity LYMPH:  no palpable  lymphadenopathy in the cervical, axillary  LUNGS: clear to auscultation and percussion with normal breathing effort HEART: regular rate & rhythm and no murmurs and no lower extremity edema ABDOMEN:abdomen soft, non-tender and normal bowel sounds Musculoskeletal:no cyanosis of digits and no clubbing  NEURO: alert & oriented x 3 with fluent speech, no focal motor/sensory deficits  LABORATORY DATA:  I have reviewed the data as listed CBC Latest Ref Rng & Units 01/23/2020 10/24/2019 08/25/2019  WBC 4.0 - 10.5 K/uL 8.2 5.9 6.7  Hemoglobin 12.0 - 15.0 g/dL 14.3 13.9 13.4  Hematocrit 36.0 - 46.0 % 45.3 42.8 41.3  Platelets 150 - 400 K/uL 241 185 180     CMP Latest Ref Rng & Units 01/23/2020 10/24/2019 08/25/2019  Glucose 70 - 99 mg/dL 97 79 90  BUN 8 - 23 mg/dL 17 15 14   Creatinine 0.44 - 1.00 mg/dL 0.76 0.80 0.80  Sodium 135 - 145 mmol/L 141 140 138  Potassium 3.5 - 5.1 mmol/L 4.0 3.7 3.9  Chloride 98 - 111 mmol/L 103 103 104  CO2 22 - 32 mmol/L 28 26 26   Calcium 8.9 - 10.3 mg/dL 9.5 9.5 9.2  Total Protein 6.5 - 8.1 g/dL 7.7 7.5 7.0  Total Bilirubin 0.3 - 1.2 mg/dL 0.4 0.7 0.8  Alkaline Phos 38 - 126 U/L 95 93 78  AST 15 - 41 U/L 21 24 19   ALT 0 - 44 U/L 17 19 12       RADIOGRAPHIC STUDIES: I have personally reviewed the radiological images as listed and agreed with the findings in the report. No results found.   ASSESSMENT & PLAN:  CATE KOPER is a 74 y.o. female with    1. 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 She underwenturgentsurgical resection on 02/20/19 due to bowel perforation. Pathology shows moderately differentiated adenomacarcinoma, grade II, positivedistalmargins, 15nodes were allnegative. Her tumor was also found to be perforated.  -I discussed given her grade II disease, positive margins and perforation, she is at high risk of local and distant recurrence,especially  peritoneal metastasis. -Dr. Raliegh Scarlet second surgery for positive margin, patient declined. She still has colostomy bag in place.  -She has repeatedly declined recommended adjuvant radiation. -To reduce her risk of recurrence, she completed 8 cycles of adjuvant Xeloda in early 10/2019.  -I personally reviewed and discussed her CT CAP from 01/23/20 which shows NED. She notes she has been having rectal discharge more frequently and occasional bleeding. She is set for Colonoscopy next month with Dr Benson Norway. I discussed this will be better to rule out local recurrence  -She is otherwise clinically doing well. Labs reviewed, CBC, CMP, CEA and iron panel WNL.  -Continue surveillance, f/u in 3 months. I encouraged her to watch for concerning symptoms of recurrence.  -  She is not interested in [place], I recommend she continue COVID19 precautions.    2. Anemia, iron deficiency -secondary to #1 and dropped after surgery. Labs supported iron deficiency.  -She currently onferrous sulfate65mg  1-2 tabs a day. She was given 1 dose IV Feraheme on 05/09/19. -01/23/20 Hg and iron panel normal. Overall resolved now.   3.Hypothyroidism -On NP Synthroid 90mg  managed by PCP -Her5/14/20TSH levelat 6.944 and increased to 27/041 in 08/2019. I previously suggested she f/u with PCP  4. Watery stool with mild urgency, secondary to surgery, rectal discharge  -She has watery stool which can be urgent at times. She is tolerating colostomy well.  -I recommend she use imodium to keep diarrhea controlled, but to avoid constipation.  -She notes recent increased frequency of rectal discharge with occasional blood. Will notify Dr. Benson Norway as she has a colonoscopy next month.    PLAN: -CT CAP reviewed, NED  -Copy note to Dr. Benson Norway, she will have colonoscopy soon  -Lab and F/u in 3 months< I encouraged her to f/u with Dr. [name] in 6 months    No problem-specific Assessment & Plan notes found for this  encounter.   No orders of the defined types were placed in this encounter.  All questions were answered. The patient knows to call the clinic with any problems, questions or concerns. No barriers to learning was detected. The total time spent in the appointment was 30 minutes.     [name], MD 01/26/2020   I, Joslyn Devon, am acting as scribe for [name], MD.   I have reviewed the above documentation for accuracy and completeness, and I agree with the above.

## 2020-01-23 ENCOUNTER — Encounter (HOSPITAL_COMMUNITY): Payer: Self-pay

## 2020-01-23 ENCOUNTER — Ambulatory Visit (HOSPITAL_COMMUNITY)
Admission: RE | Admit: 2020-01-23 | Discharge: 2020-01-23 | Disposition: A | Discharge status: Home / Self Care | Payer: PPO | Source: Ambulatory Visit | Attending: Hematology | Admitting: Hematology

## 2020-01-23 ENCOUNTER — Inpatient Hospital Stay: Payer: PPO | Attending: Hematology

## 2020-01-23 ENCOUNTER — Other Ambulatory Visit: Payer: Self-pay

## 2020-01-23 DIAGNOSIS — Z933 Colostomy status: Secondary | ICD-10-CM | POA: Diagnosis not present

## 2020-01-23 DIAGNOSIS — Z85048 Personal history of other malignant neoplasm of rectum, rectosigmoid junction, and anus: Secondary | ICD-10-CM | POA: Diagnosis not present

## 2020-01-23 DIAGNOSIS — Z9049 Acquired absence of other specified parts of digestive tract: Secondary | ICD-10-CM | POA: Diagnosis not present

## 2020-01-23 DIAGNOSIS — R152 Fecal urgency: Secondary | ICD-10-CM | POA: Insufficient documentation

## 2020-01-23 DIAGNOSIS — K6289 Other specified diseases of anus and rectum: Secondary | ICD-10-CM | POA: Diagnosis not present

## 2020-01-23 DIAGNOSIS — C20 Malignant neoplasm of rectum: Secondary | ICD-10-CM

## 2020-01-23 DIAGNOSIS — D509 Iron deficiency anemia, unspecified: Secondary | ICD-10-CM | POA: Diagnosis not present

## 2020-01-23 DIAGNOSIS — E039 Hypothyroidism, unspecified: Secondary | ICD-10-CM | POA: Diagnosis not present

## 2020-01-23 DIAGNOSIS — C218 Malignant neoplasm of overlapping sites of rectum, anus and anal canal: Secondary | ICD-10-CM | POA: Diagnosis not present

## 2020-01-23 LAB — CMP (CANCER CENTER ONLY)
ALT: 17 U/L (ref 0–44)
AST: 21 U/L (ref 15–41)
Albumin: 4.1 g/dL (ref 3.5–5.0)
Alkaline Phosphatase: 95 U/L (ref 38–126)
Anion gap: 10 (ref 5–15)
BUN: 17 mg/dL (ref 8–23)
CO2: 28 mmol/L (ref 22–32)
Calcium: 9.5 mg/dL (ref 8.9–10.3)
Chloride: 103 mmol/L (ref 98–111)
Creatinine: 0.76 mg/dL (ref 0.44–1.00)
GFR, Est AFR Am: >60 mL/min (ref >60)
GFR, Estimated: >60 mL/min (ref >60)
Glucose, Bld: 97 mg/dL (ref 70–99)
Potassium: 4 mmol/L (ref 3.5–5.1)
Sodium: 141 mmol/L (ref 135–145)
Total Bilirubin: 0.4 mg/dL (ref 0.3–1.2)
Total Protein: 7.7 g/dL (ref 6.5–8.1)

## 2020-01-23 LAB — CBC WITH DIFFERENTIAL (CANCER CENTER ONLY)
Abs Immature Granulocytes: 0.02 10*3/uL (ref 0.00–0.07)
Basophils Absolute: 0.1 10*3/uL (ref 0.0–0.1)
Basophils Relative: 1 %
Eosinophils Absolute: 0.2 10*3/uL (ref 0.0–0.5)
Eosinophils Relative: 2 %
HCT: 45.3 % (ref 36.0–46.0)
Hemoglobin: 14.3 g/dL (ref 12.0–15.0)
Immature Granulocytes: 0 %
Lymphocytes Relative: 45 %
Lymphs Abs: 3.6 10*3/uL (ref 0.7–4.0)
MCH: 29.2 pg (ref 26.0–34.0)
MCHC: 31.6 g/dL (ref 30.0–36.0)
MCV: 92.6 fL (ref 80.0–100.0)
Monocytes Absolute: 0.8 10*3/uL (ref 0.1–1.0)
Monocytes Relative: 9 %
Neutro Abs: 3.5 10*3/uL (ref 1.7–7.7)
Neutrophils Relative %: 43 %
Platelet Count: 241 10*3/uL (ref 150–400)
RBC: 4.89 MIL/uL (ref 3.87–5.11)
RDW: 12.8 % (ref 11.5–15.5)
WBC Count: 8.2 10*3/uL (ref 4.0–10.5)
nRBC: 0 % (ref 0.0–0.2)

## 2020-01-23 LAB — IRON AND TIBC
Iron: 97 ug/dL (ref 41–142)
Saturation Ratios: 27 % (ref 21–57)
TIBC: 359 ug/dL (ref 236–444)
UIBC: 262 ug/dL (ref 120–384)

## 2020-01-23 LAB — FERRITIN: Ferritin: 92 ng/mL (ref 11–307)

## 2020-01-23 LAB — CEA (IN HOUSE-CHCC): CEA (CHCC-In House): 1.62 ng/mL (ref 0.00–5.00)

## 2020-01-23 MED ORDER — SODIUM CHLORIDE (PF) 0.9 % IJ SOLN
INTRAMUSCULAR | Status: AC
  Filled 2020-01-23: qty 50

## 2020-01-23 MED ORDER — IOHEXOL 300 MG/ML  SOLN
100.0000 mL | Freq: Once | INTRAMUSCULAR | Status: AC | PRN
  Administered 2020-01-23: 100 mL via INTRAVENOUS

## 2020-01-26 ENCOUNTER — Inpatient Hospital Stay (HOSPITAL_BASED_OUTPATIENT_CLINIC_OR_DEPARTMENT_OTHER): Payer: PPO | Admitting: Hematology

## 2020-01-26 ENCOUNTER — Other Ambulatory Visit: Payer: Self-pay

## 2020-01-26 ENCOUNTER — Encounter: Payer: Self-pay | Admitting: Hematology

## 2020-01-26 ENCOUNTER — Telehealth: Payer: Self-pay | Admitting: Hematology

## 2020-01-26 VITALS — BP 131/81 | HR 91 | Temp 97.9°F | Resp 19 | Ht 66.0 in | Wt 146.0 lb

## 2020-01-26 DIAGNOSIS — Z85048 Personal history of other malignant neoplasm of rectum, rectosigmoid junction, and anus: Secondary | ICD-10-CM | POA: Diagnosis not present

## 2020-01-26 DIAGNOSIS — C20 Malignant neoplasm of rectum: Secondary | ICD-10-CM | POA: Diagnosis not present

## 2020-01-26 NOTE — Telephone Encounter (Signed)
Scheduled appt per 2/22 los - pt aware of appt date and time

## 2020-02-11 DIAGNOSIS — Z85038 Personal history of other malignant neoplasm of large intestine: Secondary | ICD-10-CM | POA: Diagnosis not present

## 2020-02-11 DIAGNOSIS — K625 Hemorrhage of anus and rectum: Secondary | ICD-10-CM | POA: Diagnosis not present

## 2020-02-11 DIAGNOSIS — R194 Change in bowel habit: Secondary | ICD-10-CM | POA: Diagnosis not present

## 2020-02-21 IMAGING — CT CT ABDOMEN AND PELVIS WITH CONTRAST
3 of 6 series · 16 of 46 positions shown, 18 images · IV contrast (APPLIED)
Comparison: 02/20/2019

CLINICAL DATA: Abdominal pain and fever. Perforated rectal
carcinoma. 5 days status post low anterior resection with colostomy.

EXAM:
CT ABDOMEN AND PELVIS WITH CONTRAST
TECHNIQUE: Multidetector CT imaging of the abdomen and pelvis was performed
using the standard protocol following bolus administration of
intravenous contrast.
CONTRAST:  100mL OMNIPAQUE IOHEXOL 300 MG/ML SOLN, 30mL OMNIPAQUE
IOHEXOL 300 MG/ML SOLN

[Series 2: axial st · axial · 0.88mm/px · z∈[-193,+267]mm · 9 of 116 slices shown, 11 images]
[im 12/116  soft-tissue]
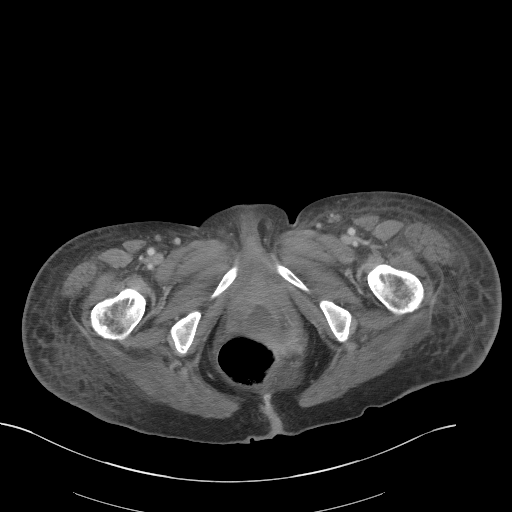
[im 12/116  bone]
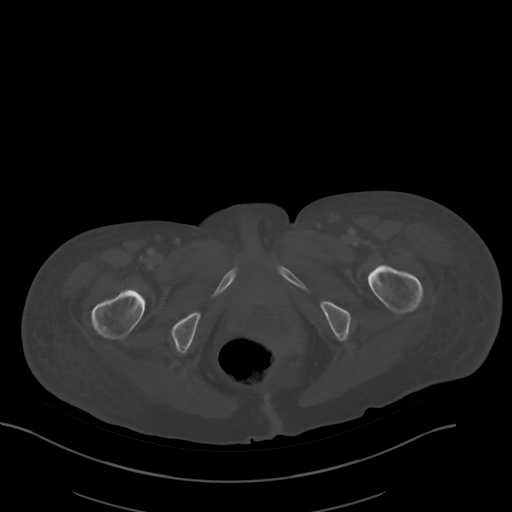
[im 24/116  soft-tissue]
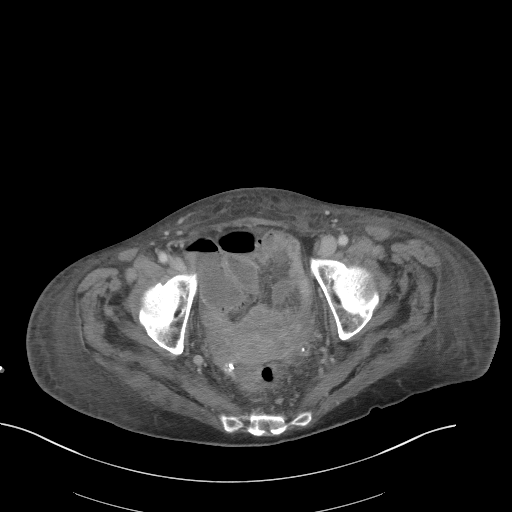
[im 35/116  soft-tissue]
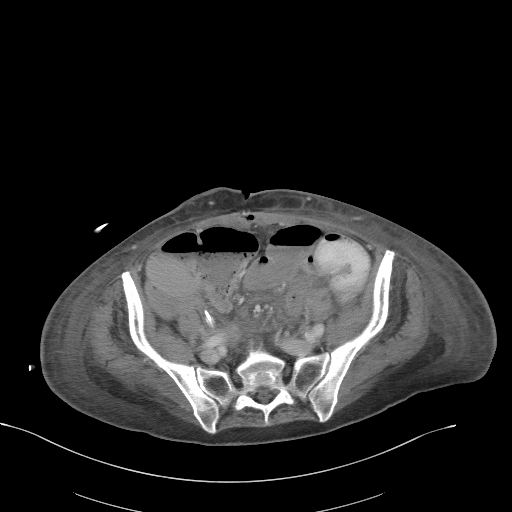
[im 47/116  soft-tissue]
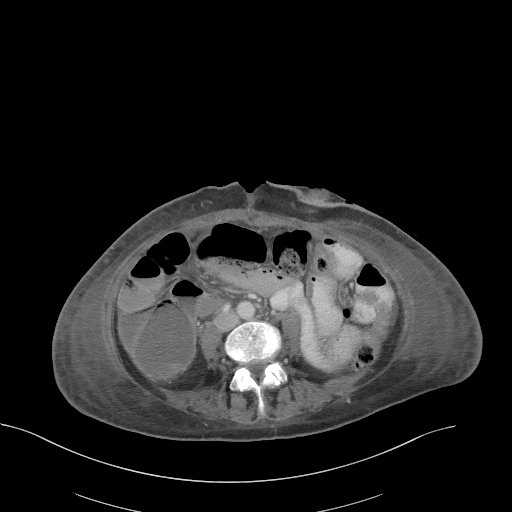
[im 58/116  soft-tissue]
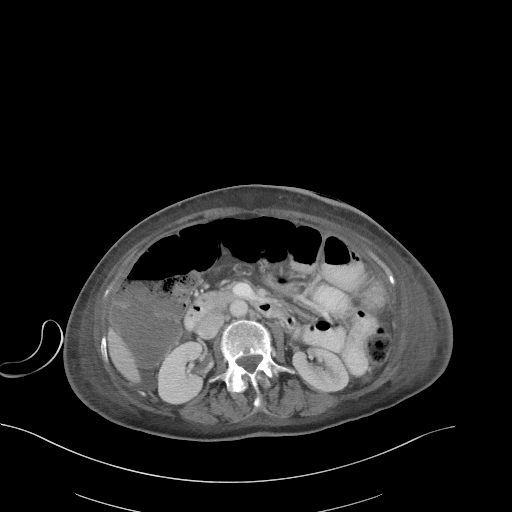
[im 70/116  soft-tissue]
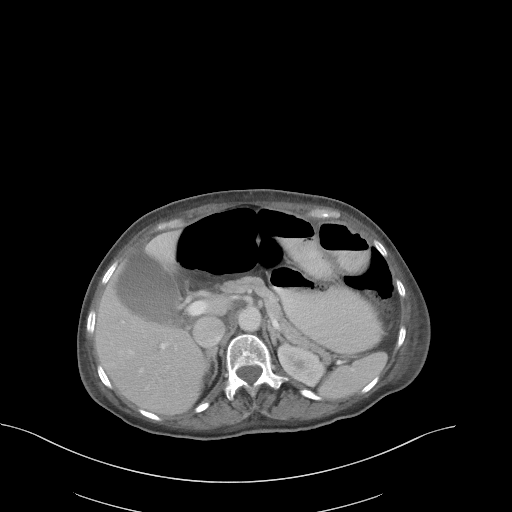
[im 81/116  soft-tissue]
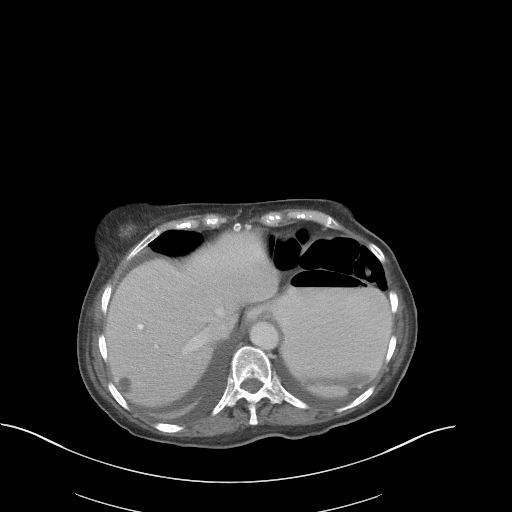
[im 93/116  soft-tissue]
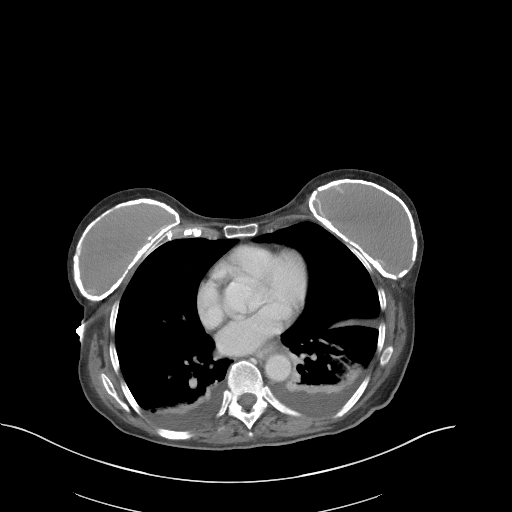
[im 104/116  soft-tissue]
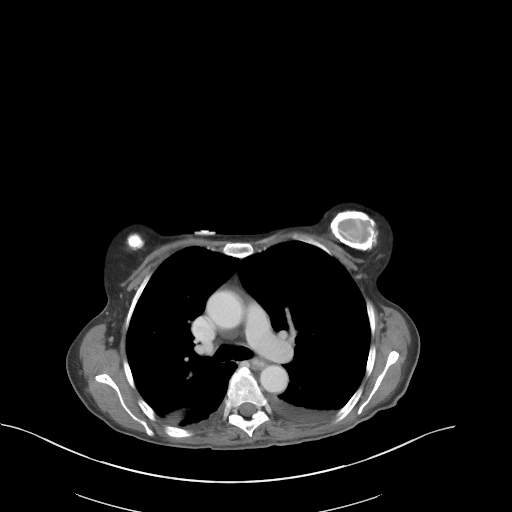
[im 104/116  bone]
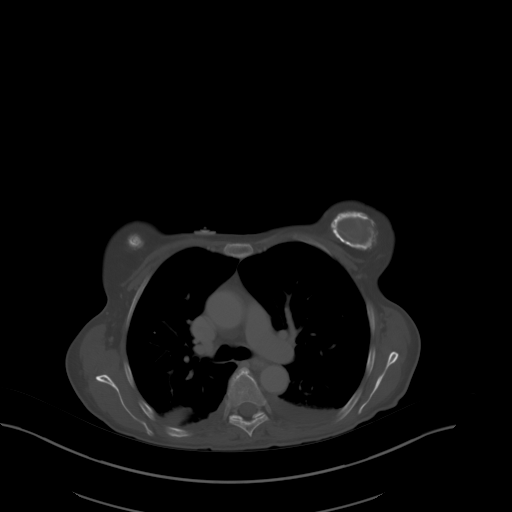

[Series 6: coronal st · coronal · 0.78mm/px · 3 of 81 slices shown]
[im 27/81  soft-tissue]
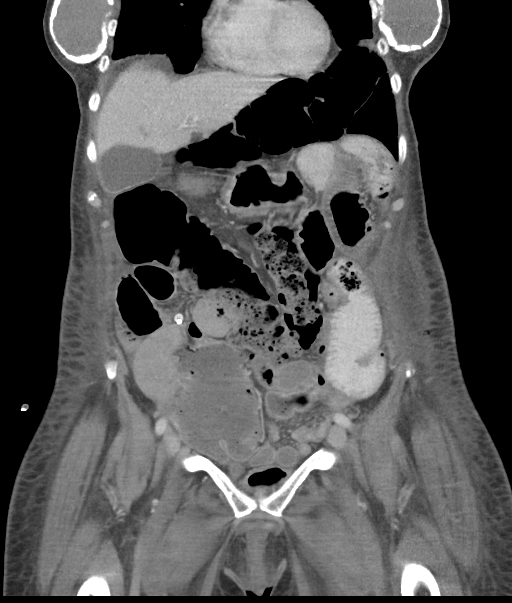
[im 36/81  soft-tissue]
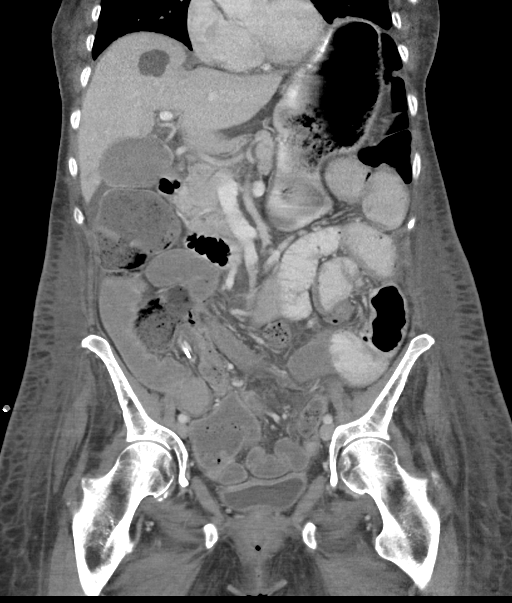
[im 45/81  soft-tissue]
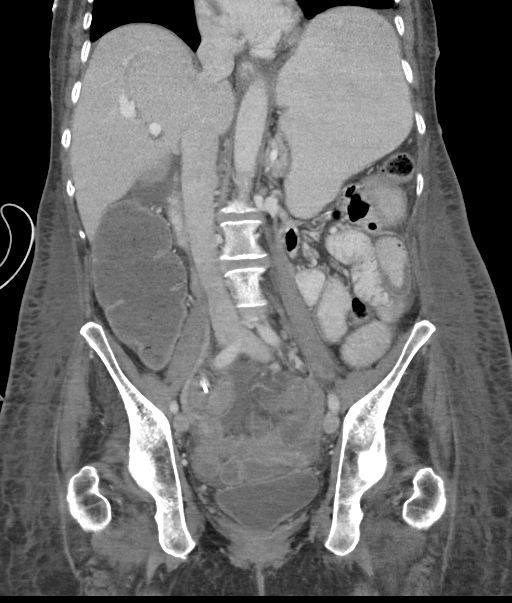

[Series 8: lung bases · axial · 0.88mm/px · z∈[+155,+229]mm · 4 of 99 slices shown]
[im 13/99  bone]
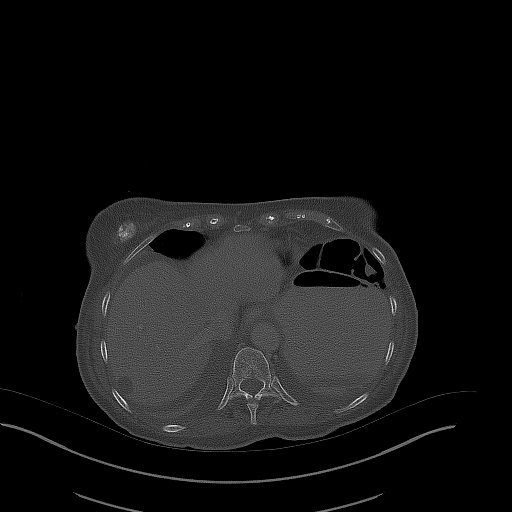
[im 25/99  bone]
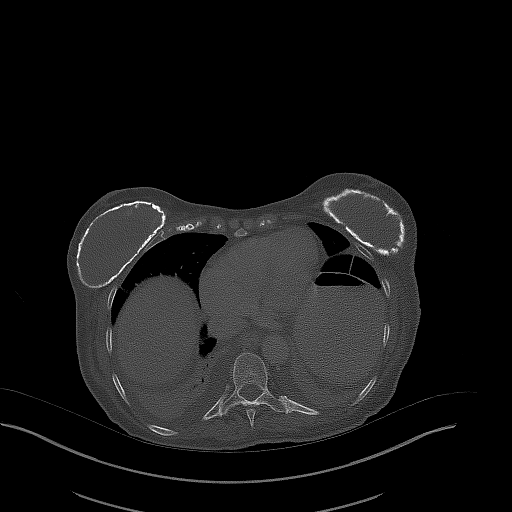
[im 37/99  bone]
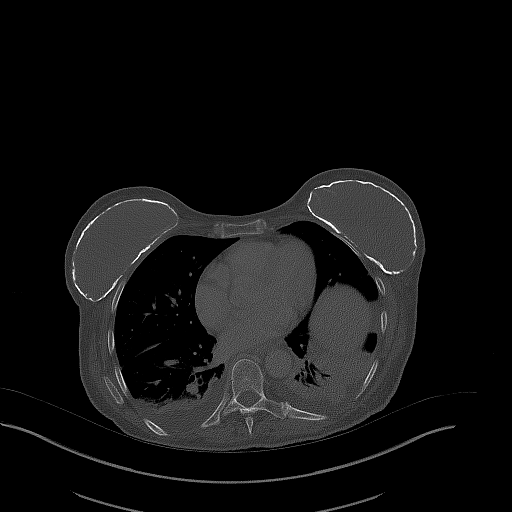
[im 50/99  bone]
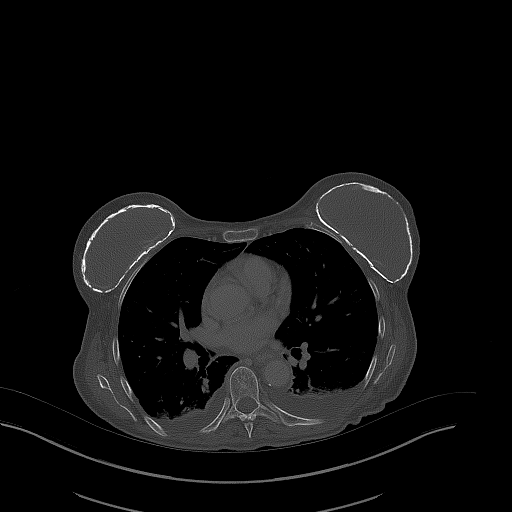

[16 of 46 positions shown; findings below may reference images not displayed]

FINDINGS: Lower Chest: Increased dependent bibasilar atelectasis and new small
bilateral pleural effusions.

Hepatobiliary: No hepatic masses identified. Several small hepatic
cysts remain stable. Gallbladder is distended but otherwise
unremarkable in appearance. No evidence of biliary ductal
dilatation.

Pancreas:  No mass or inflammatory changes.

Spleen: Within normal limits in size and appearance.

Adrenals/Urinary Tract: No masses identified. No evidence of
hydronephrosis. Small amount of gas in the urinary bladder is most
likely due to recent instrumentation.

Stomach/Bowel: Postop changes are seen from low anterior resection
with left lower quadrant colostomy since prior exam. Surgical drain
is seen within the pelvic cul-de-sac. Generalized gaseous distention
of small bowel and colon is consistent with postop ileus. No focal
inflammatory process abscess identified.

Vascular/Lymphatic: No pathologically enlarged lymph nodes. No
abdominal aortic aneurysm.

Reproductive:  No mass or other significant abnormality.

Other:  Increased diffuse body wall and mesenteric edema.

Musculoskeletal:  No suspicious bone lesions identified.
IMPRESSION: 1. Postop changes from low anterior resection with left lower
quadrant colostomy. Postop ileus. No evidence of abscess or bowel
obstruction.
2. Increased diffuse body wall and mesenteric edema and small
bilateral pleural effusions, consistent with 3rd spacing.

## 2020-03-11 ENCOUNTER — Telehealth: Payer: Self-pay | Admitting: Hematology

## 2020-03-11 DIAGNOSIS — K625 Hemorrhage of anus and rectum: Secondary | ICD-10-CM | POA: Diagnosis not present

## 2020-03-11 DIAGNOSIS — D122 Benign neoplasm of ascending colon: Secondary | ICD-10-CM | POA: Diagnosis not present

## 2020-03-11 DIAGNOSIS — C189 Malignant neoplasm of colon, unspecified: Secondary | ICD-10-CM | POA: Diagnosis not present

## 2020-03-11 DIAGNOSIS — C187 Malignant neoplasm of sigmoid colon: Secondary | ICD-10-CM | POA: Diagnosis not present

## 2020-03-11 DIAGNOSIS — C19 Malignant neoplasm of rectosigmoid junction: Secondary | ICD-10-CM | POA: Diagnosis not present

## 2020-03-11 DIAGNOSIS — K635 Polyp of colon: Secondary | ICD-10-CM | POA: Diagnosis not present

## 2020-03-11 DIAGNOSIS — K5730 Diverticulosis of large intestine without perforation or abscess without bleeding: Secondary | ICD-10-CM | POA: Diagnosis not present

## 2020-03-11 DIAGNOSIS — K921 Melena: Secondary | ICD-10-CM | POA: Diagnosis not present

## 2020-03-11 NOTE — Telephone Encounter (Signed)
Scheduled appt per 4/8 sch message - unable to reach pt . Left message with apt date and time

## 2020-03-12 ENCOUNTER — Telehealth: Payer: Self-pay

## 2020-03-12 ENCOUNTER — Telehealth: Payer: Self-pay | Admitting: Hematology

## 2020-03-12 NOTE — Telephone Encounter (Signed)
Pt left vm reequesting appt with Dr. Burr Medico on 4/16 be rescheduled to 4/19 or 4/20 in the afternoon.  Scheduling message sent to reschedule for 4/19.

## 2020-03-12 NOTE — Telephone Encounter (Signed)
R/s appt per 4/9  sch message - pt is aware of change

## 2020-03-18 NOTE — Progress Notes (Signed)
[place]   Telephone:([phone]) [phone] Fax:(336) [phone]   Clinic Follow up Note   Patient Care Team: [name], MD as PCP - General (Family Medicine) [name], MD as Consulting Physician (General Surgery) [name], MD as Consulting Physician (Radiation Oncology) [name], MD as Consulting Physician (Hematology) Carol Ada, MD as Consulting Physician (Gastroenterology)  Date of Service:  03/22/2020  CHIEF COMPLAINT: f/u of rectal cancer  SUMMARY OF ONCOLOGIC HISTORY: Oncology History Overview Note  Cancer Staging Rectal cancer [facility]) Staging form: Colon and Rectum, AJCC 8th Edition - Pathologic stage from 02/20/2019: Stage IIA (pT3, pN0, cM0) - Signed by [name], MD on 02/24/2019     Rectal cancer ([place])  02/08/2019 Imaging   CT AP W Contrast 02/08/19  IMPRESSION: 1. The rectum and sigmoid colon are markedly abnormal with wall thickening, somewhat masslike in appearance in some regions. There is also a region of stricture/narrowing resulting in partial obstruction with fecal loading throughout the remainder of the colon. The patient is at risk of developing a high-grade obstruction. The constellation of findings could be due to an underlying malignancy. A benign stricture with a superimposed colitis/proctitis is a possibility. An infectious or inflammatory colitis are also possibilities. Recommend consultation with gastroenterology. The patient will likely require direct visualization to determine the underlying etiology. Again, malignancy is not excluded. 2. Gastric distention. There is wall thickening in the region of the antrum just distal to the distension. Recommend direct visualization or upper GI. 3. No other significant abnormalities.   02/11/2019 Procedure   Flexible Sigmoidoscopy 02/11/19 by Dr. Benson Norway IMPRESSION -a malignant completely obstructing tumor in the recto-sigmoid colon - biopsied.   02/11/2019 Initial Biopsy   Colon, rectosigmoid mass biopsy --Invasive Adenocarcinoma, moderately differentiated   02/17/2019 Imaging   CT Chest W Contrast 02/17/19  IMPRESSION: No evidence of metastatic disease in the chest.    02/20/2019 Imaging   CT CP W Contrast 02/20/19  IMPRESSION: 1. Pneumoperitoneum indicating a perforated loop of bowel. There appears to be a 4 cm extraluminal collection of gas and fluid central in the pelvis anterior to the sigmoid colon (series 2, image 68). This is in the region of distal large bowel tumor as described on 02/07/2019. Critical Value/emergent results were called by telephone at the time of interpretation on 02/20/2019 at 1933 hours to PA-C [name] , who verbally acknowledged these results. 2. Small volume of free fluid and free air scattered in the bilateral abdomen. Generalized mesenteric inflammation. 3. No metastatic disease identified.   02/20/2019 Surgery   EXPLORATORY LAPAROTOMY with LOWER ANTERIOR RESECTION WITH COLOSTOMY by Dr. [name]  02/20/19    02/20/2019 Cancer Staging   Staging form: Colon and Rectum, AJCC 8th Edition - Pathologic stage from 02/20/2019: Stage IIA (pT3, pN0, cM0) - Signed by [name], MD on 02/24/2019   02/20/2019 Pathology Results   Surgical Pathology  Diagnosis Colon, segmental resection for tumor, recto-sigmoid colon - INVASIVE ADENOCARCINOMA, MODERATELY DIFFERENTIATED, SPANNING 4 CM. - TUMOR INVOLVES PERICOLONIC SOFT TISSUE. - DIVERTICULITIS WITH ABSCESS. - SEROSITIS. - DISTAL RESECTION MARGIN IS POSITIVE FOR CARCINOMA. - FIFTEEN OF FIFTEEN LYMPH NODES NEGATIVE FOR CARCINOMA (0/15). - SEE ONCOLOGY TABLE.    02/24/2019 Initial Diagnosis   Rectal cancer ([place])   02/25/2019 Imaging   CT AP 02/25/19  IMPRESSION: 1. Postop changes from low anterior resection with left lower quadrant colostomy. Postop ileus. No evidence of abscess or bowel obstruction. 2. Increased diffuse body wall and mesenteric edema and small  bilateral  pleural effusions, consistent with 3rd spacing.   04/21/2019 -  Chemotherapy   Adjuvant Xeloda 1500mg  BID 2 weeks on and 1 week off, every 21 day starting on 04/21/19 completed 8 cycles first week of November.    01/23/2020 Imaging   CT CAP W contrast  IMPRESSION: No acute findings. No evidence of recurrent or metastatic carcinoma, or other significant abnormality.      CURRENT THERAPY:  Surveillance  INTERVAL HISTORY:  Marie Levy is here for a follow up of rectal cancer. She  presents to the clinic alone. She had a colonoscopy last week. She notes she had not had any abdominal and rectal discomforts. She was having rectal bleeding increasingly (3 times a day) in the past few weeks. No rectal bleeding in the last 4 days. She was told by Dr Benson Norway that she had a rectal mass and it was found to be cancer recurrence. Her colon polyp was benign. Per patient she notes when Dr [name] previously discussed another surgery she understood it was about if she had cancer recurrence, not that her surgery was offered at that time for positive margins.      REVIEW OF SYSTEMS:   Constitutional: Denies fevers, chills or abnormal weight loss Eyes: Denies blurriness of vision Ears, nose, mouth, throat, and face: Denies mucositis or sore throat Respiratory: Denies cough, dyspnea or wheezes Cardiovascular: Denies palpitation, chest discomfort or lower extremity swelling Gastrointestinal:  Denies nausea, heartburn or change in bowel habits   Skin: Denies abnormal skin rashes Lymphatics: Denies new lymphadenopathy or easy bruising Neurological:Denies numbness, tingling or new weaknesses Behavioral/Psych: Mood is stable, no new changes  All other systems were reviewed with the patient and are negative.  MEDICAL HISTORY:  Past Medical History:  Diagnosis Date  . Rectal cancer ([place]) dx'd 02/2019  . Thyroid disease     SURGICAL HISTORY: Past Surgical History:  Procedure Laterality Date  . COLECTOMY  WITH COLOSTOMY CREATION/HARTMANN PROCEDURE N/A 02/20/2019   Procedure: LOWER ANTERIOR RESECTION WITH COLOSTOMY;  Surgeon: [name], MD;  Location: [facility];  Service: General;  Laterality: N/A;  . LAPAROTOMY N/A 02/20/2019   Procedure: EXPLORATORY LAPAROTOMY;  Surgeon: [name], MD;  Location: [facility];  Service: General;  Laterality: N/A;  . TONSILLECTOMY      I have reviewed the social history and family history with the patient and they are unchanged from previous note.  ALLERGIES:  has No Known Allergies.  MEDICATIONS:  Current Outpatient Medications  Medication Sig Dispense Refill  . Beta Carotene (VITAMIN A) 25000 UNIT capsule Take 25,000 Units by mouth daily.    Marland Kitchen UNABLE TO FIND Med Name: Quercidin    . Ascorbic Acid (VITAMIN C) 100 MG tablet Take 100 mg by mouth daily.    . Digestive Enzyme CAPS Take 1 capsule by mouth daily.    Marland Kitchen ELDERBERRY PO Take by mouth.    . ferrous sulfate 325 (65 FE) MG tablet Take 27 mg by mouth daily with breakfast. From [employer]    . Flaxseed, Linseed, (FLAXSEED OIL PO) Take by mouth.    . Garlic 10 MG CAPS Take by mouth.    . magnesium 30 MG tablet Take 500 mg by mouth daily.     . NP THYROID 90 MG tablet Take 90 mg by mouth daily. Mon-Fri    . Selenium 100 MCG CAPS Take 1 capsule by mouth daily.    Marland Kitchen UNABLE TO FIND daily. Med Name: [place] Haggard    .  UNABLE TO FIND Take by mouth daily. Med Name: [place]    . UNABLE TO FIND Take by mouth daily. Med Name: HCL    . VITAMIN D PO Take 30 mLs by mouth daily.    Marland Kitchen zinc gluconate 50 MG tablet Take 50 mg by mouth daily.     No current facility-administered medications for this visit.    PHYSICAL EXAMINATION: ECOG PERFORMANCE STATUS: 1 - Symptomatic but completely ambulatory  Vitals:   03/22/20 1433  BP: 117/82  Pulse: (!) 107  Resp: 20  Temp: 98.9 F (37.2 C)  SpO2: 99%   Filed Weights   03/22/20 1433  Weight: 141 lb (64 kg)    GENERAL:alert, no distress and comfortable SKIN:  skin color, texture, turgor are normal, no rashes or significant lesions EYES: normal, Conjunctiva are pink and non-injected, sclera clear  NECK: supple, thyroid normal size, non-tender, without nodularity LYMPH:  no palpable lymphadenopathy in the cervical, axillary  LUNGS: clear to auscultation and percussion with normal breathing effort HEART: regular rate & rhythm and no murmurs and no lower extremity edema ABDOMEN:abdomen soft, non-tender and normal bowel sounds Musculoskeletal:no cyanosis of digits and no clubbing  NEURO: alert & oriented x 3 with fluent speech, no focal motor/sensory deficits  LABORATORY DATA:  I have reviewed the data as listed CBC Latest Ref Rng & Units 03/22/2020 01/23/2020 10/24/2019  WBC 4.0 - 10.5 K/uL 8.3 8.2 5.9  Hemoglobin 12.0 - 15.0 g/dL 13.2 14.3 13.9  Hematocrit 36.0 - 46.0 % 42.6 45.3 42.8  Platelets 150 - 400 K/uL 255 241 185     CMP Latest Ref Rng & Units 03/22/2020 01/23/2020 10/24/2019  Glucose 70 - 99 mg/dL 111(H) 97 79  BUN 8 - 23 mg/dL 9 17 15   Creatinine 0.44 - 1.00 mg/dL 0.79 0.76 0.80  Sodium 135 - 145 mmol/L 142 141 140  Potassium 3.5 - 5.1 mmol/L 3.9 4.0 3.7  Chloride 98 - 111 mmol/L 107 103 103  CO2 22 - 32 mmol/L 27 28 26   Calcium 8.9 - 10.3 mg/dL 9.0 9.5 9.5  Total Protein 6.5 - 8.1 g/dL 7.2 7.7 7.5  Total Bilirubin 0.3 - 1.2 mg/dL 0.5 0.4 0.7  Alkaline Phos 38 - 126 U/L 91 95 93  AST 15 - 41 U/L 22 21 24   ALT 0 - 44 U/L 15 17 19       RADIOGRAPHIC STUDIES: I have personally reviewed the radiological images as listed and agreed with the findings in the report. No results found.   ASSESSMENT & PLAN:  JIAVANNA TEMPESTA is a 74 y.o. female with    1. 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 She underwenturgentsurgical resection on 02/20/19 due to bowel perforation. Pathology shows moderately differentiated  adenomacarcinoma, grade II, positivedistalmargins, 15nodes were allnegative. Her tumor was also found to be perforated.  -I discussed given her grade II disease, positive margins and perforation, she is at high risk of local and distant recurrence,especially peritoneal metastasis. -Dr. Raliegh Scarlet second surgery for positive margin, patient declined. She still has colostomy bag in place.  -She had repeatedly declined recommended adjuvant radiation. -To reduce her risk of recurrence,she completed 8 cycles of adjuvant Xeloda in early 10/2019.  -Her CT CAP from 01/23/20 showed NED.  -She has been having more rectal bleeding lately. She underwent colonoscopy with Dr Benson Norway on 03/11/2020. She was found to have a large colon cancer recurrence at the proximal portion  of the Hartman's pouch (biopsy confirmed).  -I recommend MRI for better imaging of her rectal area to see how extensive her cancer is before surgical consult with Dr. [name]. If her surgeon is going to be more extensive she may consult with surgeons at [facility] or [place]. She is open to surgery  -I also discussed if she is not eligible for surgical resection then her cancer would not be curable but still treatable. She would have the option of chemotherapy and Radiation to control her disease. She is not very interested in chemo, and still refuse radiation at this time.  -Give her local recurrence To be completely removed by surgery, I do not plan to give her more adjuvant chemotherapy. -F/u open, after surgery    2. Anemia, iron deficiency -secondary to #1and dropped after surgery. Labs supported iron deficiency. -She currently onferrous sulfate65mg  1-2 tabs a day. She was given 1 dose IV Feraheme on 05/09/19. -Previously resolved  -She recently had increased rectal bleeding. Has stopped after colonoscopy last week. Will check her CBC and iron panel this week.  -She will continue oral iron.   3.Hypothyroidism -On NP Synthroid  90mg  managed by PCP -Her5/14/20TSH levelat 6.944and increased to 27/041 in 08/2019.Ipreviouslysuggested shef/u with PCP  4. Watery stool with mild urgency, secondary to surgery, rectal discharge  -She has watery stool which can be urgent at times. She is tolerating colostomy well.  -I recommend she use imodium to keep diarrhea controlled, but to avoid constipation. -She notes recent increased frequency of rectal discharge with occasional blood. Will notify Dr. Benson Norway as she has a colonoscopy next month.    PLAN: -MRI Pelvis in  1-2 weeks  -f/u with Dr. [name] after MRI  -F/u after surgery   No problem-specific Assessment & Plan notes found for this encounter.   Orders Placed This Encounter  Procedures  . MR Pelvis W Wo Contrast    Standing Status:   Future    Standing Expiration Date:   05/22/2021    Order Specific Question:   If indicated for the ordered procedure, I authorize the administration of contrast media per Radiology protocol    Answer:   Yes    Order Specific Question:   What is the patient's sedation requirement?    Answer:   No Sedation    Order Specific Question:   Does the patient have a pacemaker or implanted devices?    Answer:   No    Order Specific Question:   Radiology Contrast Protocol - do NOT remove file path    Answer:   [email or web address]    Order Specific Question:   Preferred imaging location?    Answer:   [facility] (table limit - 550lbs)   All questions were answered. The patient knows to call the clinic with any problems, questions or concerns. No barriers to learning was detected. The total time spent in the appointment was 30 minutes.     [name], MD 03/22/2020   I, Joslyn Devon, am acting as scribe for [name], MD.   I have reviewed the above documentation for accuracy and completeness, and I agree with the above.

## 2020-03-19 ENCOUNTER — Ambulatory Visit: Payer: PPO | Admitting: Hematology

## 2020-03-22 ENCOUNTER — Encounter: Payer: Self-pay | Admitting: Hematology

## 2020-03-22 ENCOUNTER — Ambulatory Visit: Payer: PPO | Admitting: Hematology

## 2020-03-22 ENCOUNTER — Inpatient Hospital Stay: Payer: PPO | Attending: Hematology | Admitting: Hematology

## 2020-03-22 ENCOUNTER — Other Ambulatory Visit: Payer: Self-pay

## 2020-03-22 ENCOUNTER — Inpatient Hospital Stay: Payer: PPO

## 2020-03-22 VITALS — BP 117/82 | HR 107 | Temp 98.9°F | Resp 20 | Ht 66.0 in | Wt 141.0 lb

## 2020-03-22 DIAGNOSIS — C20 Malignant neoplasm of rectum: Secondary | ICD-10-CM | POA: Diagnosis not present

## 2020-03-22 DIAGNOSIS — C19 Malignant neoplasm of rectosigmoid junction: Secondary | ICD-10-CM | POA: Insufficient documentation

## 2020-03-22 DIAGNOSIS — R152 Fecal urgency: Secondary | ICD-10-CM | POA: Insufficient documentation

## 2020-03-22 DIAGNOSIS — D509 Iron deficiency anemia, unspecified: Secondary | ICD-10-CM | POA: Diagnosis not present

## 2020-03-22 DIAGNOSIS — Z933 Colostomy status: Secondary | ICD-10-CM | POA: Diagnosis not present

## 2020-03-22 DIAGNOSIS — E039 Hypothyroidism, unspecified: Secondary | ICD-10-CM | POA: Diagnosis not present

## 2020-03-22 LAB — CBC WITH DIFFERENTIAL (CANCER CENTER ONLY)
Abs Immature Granulocytes: 0.02 10*3/uL (ref 0.00–0.07)
Basophils Absolute: 0.1 10*3/uL (ref 0.0–0.1)
Basophils Relative: 1 %
Eosinophils Absolute: 0.2 10*3/uL (ref 0.0–0.5)
Eosinophils Relative: 2 %
HCT: 42.6 % (ref 36.0–46.0)
Hemoglobin: 13.2 g/dL (ref 12.0–15.0)
Immature Granulocytes: 0 %
Lymphocytes Relative: 37 %
Lymphs Abs: 3 10*3/uL (ref 0.7–4.0)
MCH: 28.2 pg (ref 26.0–34.0)
MCHC: 31 g/dL (ref 30.0–36.0)
MCV: 91 fL (ref 80.0–100.0)
Monocytes Absolute: 0.9 10*3/uL (ref 0.1–1.0)
Monocytes Relative: 11 %
Neutro Abs: 4.1 10*3/uL (ref 1.7–7.7)
Neutrophils Relative %: 49 %
Platelet Count: 255 10*3/uL (ref 150–400)
RBC: 4.68 MIL/uL (ref 3.87–5.11)
RDW: 13.7 % (ref 11.5–15.5)
WBC Count: 8.3 10*3/uL (ref 4.0–10.5)
nRBC: 0 % (ref 0.0–0.2)

## 2020-03-22 LAB — CMP (CANCER CENTER ONLY)
ALT: 15 U/L (ref 0–44)
AST: 22 U/L (ref 15–41)
Albumin: 3.6 g/dL (ref 3.5–5.0)
Alkaline Phosphatase: 91 U/L (ref 38–126)
Anion gap: 8 (ref 5–15)
BUN: 9 mg/dL (ref 8–23)
CO2: 27 mmol/L (ref 22–32)
Calcium: 9 mg/dL (ref 8.9–10.3)
Chloride: 107 mmol/L (ref 98–111)
Creatinine: 0.79 mg/dL (ref 0.44–1.00)
GFR, Est AFR Am: >60 mL/min (ref >60)
GFR, Estimated: >60 mL/min (ref >60)
Glucose, Bld: 111 mg/dL — ABNORMAL HIGH (ref 70–99)
Potassium: 3.9 mmol/L (ref 3.5–5.1)
Sodium: 142 mmol/L (ref 135–145)
Total Bilirubin: 0.5 mg/dL (ref 0.3–1.2)
Total Protein: 7.2 g/dL (ref 6.5–8.1)

## 2020-03-23 ENCOUNTER — Telehealth: Payer: Self-pay | Admitting: Hematology

## 2020-03-23 NOTE — Telephone Encounter (Signed)
No 4/19 los. No changes made to pt's schedule.  

## 2020-03-24 ENCOUNTER — Telehealth: Payer: Self-pay

## 2020-03-24 NOTE — Telephone Encounter (Signed)
I let Marie Levy know her MRI is scheduled at [facility] per her request on 04/19/2020 at 1:00, arrival time 1245, npo 4 hours prior to MRI.  She verbalized understanding

## 2020-03-30 DIAGNOSIS — C20 Malignant neoplasm of rectum: Secondary | ICD-10-CM | POA: Diagnosis not present

## 2020-03-30 DIAGNOSIS — E039 Hypothyroidism, unspecified: Secondary | ICD-10-CM | POA: Diagnosis not present

## 2020-04-09 DIAGNOSIS — Z933 Colostomy status: Secondary | ICD-10-CM | POA: Diagnosis not present

## 2020-04-19 ENCOUNTER — Ambulatory Visit
Admission: RE | Admit: 2020-04-19 | Discharge: 2020-04-19 | Disposition: A | Discharge status: Home / Self Care | Payer: PPO | Source: Ambulatory Visit | Attending: Hematology | Admitting: Hematology

## 2020-04-19 ENCOUNTER — Other Ambulatory Visit: Payer: Self-pay

## 2020-04-19 DIAGNOSIS — C20 Malignant neoplasm of rectum: Secondary | ICD-10-CM | POA: Diagnosis not present

## 2020-04-22 NOTE — Progress Notes (Signed)
Marie Levy   [phone] [phone]   Clinic Follow up Note   Patient Care Team: [name], MD as PCP - General (Family Medicine) [name], MD as Consulting Physician (General Surgery) [name], MD as Consulting Physician (Radiation Oncology) [name], MD as Consulting Physician (Hematology) Carol Ada, MD as Consulting Physician (Gastroenterology)  Date of Service:  04/23/2020  CHIEF COMPLAINT: f/u of rectal cancer  SUMMARY OF ONCOLOGIC HISTORY: Oncology History Overview Note  Cancer Staging Rectal cancer [facility]) Staging form: Colon and Rectum, AJCC 8th Edition - Pathologic stage from 02/20/2019: Stage IIA (pT3, pN0, cM0) - Signed by [name], MD on 02/24/2019     Rectal cancer ([place])  02/08/2019 Imaging   CT AP W Contrast 02/08/19  IMPRESSION: 1. The rectum and sigmoid colon are markedly abnormal with wall thickening, somewhat masslike in appearance in some regions. There is also a region of stricture/narrowing resulting in partial obstruction with fecal loading throughout the remainder of the colon. The patient is at risk of developing a high-grade obstruction. The constellation of findings could be due to an underlying malignancy. A benign stricture with a superimposed colitis/proctitis is a possibility. An infectious or inflammatory colitis are also possibilities. Recommend consultation with gastroenterology. The patient will likely require direct visualization to determine the underlying etiology. Again, malignancy is not excluded. 2. Gastric distention. There is wall thickening in the region of the antrum just distal to the distension. Recommend direct visualization or upper GI. 3. No other significant abnormalities.   02/11/2019 Procedure   Flexible Sigmoidoscopy 02/11/19 by Dr. Benson Norway IMPRESSION -a malignant completely obstructing tumor in the recto-sigmoid colon - biopsied.   02/11/2019 Initial Biopsy    Colon, rectosigmoid mass biopsy --Invasive Adenocarcinoma, moderately differentiated   02/17/2019 Imaging   CT Chest W Contrast 02/17/19  IMPRESSION: No evidence of metastatic disease in the chest.    02/20/2019 Imaging   CT CP W Contrast 02/20/19  IMPRESSION: 1. Pneumoperitoneum indicating a perforated loop of bowel. There appears to be a 4 cm extraluminal collection of gas and fluid central in the pelvis anterior to the sigmoid colon (series 2, image 68). This is in the region of distal large bowel tumor as described on 02/07/2019. Critical Value/emergent results were called by telephone at the time of interpretation on 02/20/2019 at 1933 hours to PA-C [name] , who verbally acknowledged these results. 2. Small volume of free fluid and free air scattered in the bilateral abdomen. Generalized mesenteric inflammation. 3. No metastatic disease identified.   02/20/2019 Surgery   EXPLORATORY LAPAROTOMY with LOWER ANTERIOR RESECTION WITH COLOSTOMY by Dr. [name]  02/20/19    02/20/2019 Cancer Staging   Staging form: Colon and Rectum, AJCC 8th Edition - Pathologic stage from 02/20/2019: Stage IIA (pT3, pN0, cM0) - Signed by [name], MD on 02/24/2019   02/20/2019 Pathology Results   Surgical Pathology  Diagnosis Colon, segmental resection for tumor, recto-sigmoid colon - INVASIVE ADENOCARCINOMA, MODERATELY DIFFERENTIATED, SPANNING 4 CM. - TUMOR INVOLVES PERICOLONIC SOFT TISSUE. - DIVERTICULITIS WITH ABSCESS. - SEROSITIS. - DISTAL RESECTION MARGIN IS POSITIVE FOR CARCINOMA. - FIFTEEN OF FIFTEEN LYMPH NODES NEGATIVE FOR CARCINOMA (0/15). - SEE ONCOLOGY TABLE.    02/24/2019 Initial Diagnosis   Rectal cancer ([place])   02/25/2019 Imaging   CT AP 02/25/19  IMPRESSION: 1. Postop changes from low anterior resection with left lower quadrant colostomy. Postop ileus. No evidence of abscess or bowel obstruction. 2. Increased diffuse body wall and mesenteric edema and  small bilateral  pleural effusions, consistent with 3rd spacing.   04/21/2019 -  Chemotherapy   Adjuvant Xeloda 1500mg  BID 2 weeks on and 1 week off, every 21 day starting on 04/21/19 completed 8 cycles first week of November.    01/23/2020 Imaging   CT CAP W contrast  IMPRESSION: No acute findings. No evidence of recurrent or metastatic carcinoma, or other significant abnormality.   04/19/2020 Imaging   MRI Pelvis   IMPRESSION: Recurrent rectal carcinoma involving the proximal most portion of the Hartman pouch, at the level of the surgical anastomosis. Although this is a recurrent carcinoma following surgical resection and chemotherapy, stage by imaging is:   Rectal adenocarcinoma T stage: T3d   Rectal adenocarcinoma N stage:  N2   Distance from tumor to the internal anal sphincter is 6.4 cm.      CURRENT THERAPY:  Surveillance  INTERVAL HISTORY:  Marie Levy is here for a follow up of rectal cancer. She presents to the clinic alone. She notes she is doing well. She only had recent issue after her MRI scan for a few days. She has been able to recover. She denies abnormal BM and no more GI bleeding. She notes only occasional drops of blood now. She would like second surgical opinion at [facility] with Dr. Hester Mates. She is not very interested in chemo or RT. She is more interested in holistic and natural treatments as possible. She plans to attend a Symposium Hosted by Sharee Holster with a panel of physicians and cancer experts.     REVIEW OF SYSTEMS:   Constitutional: Denies fevers, chills or abnormal weight loss Eyes: Denies blurriness of vision Ears, nose, mouth, throat, and face: Denies mucositis or sore throat Respiratory: Denies cough, dyspnea or wheezes Cardiovascular: Denies palpitation, chest discomfort or lower extremity swelling Gastrointestinal:  Denies nausea, heartburn or change in bowel habits (+) Occasional drop of blood in stool  Skin: Denies abnormal skin rashes Lymphatics:  Denies new lymphadenopathy or easy bruising Neurological:Denies numbness, tingling or new weaknesses Behavioral/Psych: Mood is stable, no new changes  All other systems were reviewed with the patient and are negative.  MEDICAL HISTORY:  Past Medical History:  Diagnosis Date  . Rectal cancer ([place]) dx'd 02/2019  . Thyroid disease     SURGICAL HISTORY: Past Surgical History:  Procedure Laterality Date  . COLECTOMY WITH COLOSTOMY CREATION/HARTMANN PROCEDURE N/A 02/20/2019   Procedure: LOWER ANTERIOR RESECTION WITH COLOSTOMY;  Surgeon: [name], MD;  Location: [facility];  Service: General;  Laterality: N/A;  . LAPAROTOMY N/A 02/20/2019   Procedure: EXPLORATORY LAPAROTOMY;  Surgeon: [name], MD;  Location: [facility];  Service: General;  Laterality: N/A;  . TONSILLECTOMY      I have reviewed the social history and family history with the patient and they are unchanged from previous note.  ALLERGIES:  has No Known Allergies.  MEDICATIONS:  Current Outpatient Medications  Medication Sig Dispense Refill  . Ascorbic Acid (VITAMIN C) 100 MG tablet Take 100 mg by mouth daily.    . Beta Carotene (VITAMIN A) 25000 UNIT capsule Take 25,000 Units by mouth daily.    . Digestive Enzyme CAPS Take 1 capsule by mouth daily.    Marland Kitchen ELDERBERRY PO Take by mouth.    . ferrous sulfate 325 (65 FE) MG tablet Take 27 mg by mouth daily with breakfast. From [employer]    . Flaxseed, Linseed, (FLAXSEED OIL PO) Take by mouth.    . Garlic 10 MG  CAPS Take by mouth.    . magnesium 30 MG tablet Take 500 mg by mouth daily.     . NP THYROID 90 MG tablet Take 90 mg by mouth daily. Mon-Fri    . Selenium 100 MCG CAPS Take 1 capsule by mouth daily.    Marland Kitchen UNABLE TO FIND daily. Med Name: Norville Haggard    . UNABLE TO FIND Take by mouth daily. Med Name: [place]o    . UNABLE TO FIND Take by mouth daily. Med Name: HCL    . UNABLE TO FIND Med Name: Quercidin    . VITAMIN D PO Take 30 mLs by mouth daily.    Marland Kitchen zinc  gluconate 50 MG tablet Take 50 mg by mouth daily.     No current facility-administered medications for this visit.    PHYSICAL EXAMINATION: ECOG PERFORMANCE STATUS: 0 - Asymptomatic  Vitals:   04/23/20 1408  BP: 112/78  Pulse: (!) 109  Resp: 20  Temp: 98.1 F (36.7 C)  SpO2: 97%   Filed Weights   04/23/20 1408  Weight: 137 lb 1.6 oz (62.2 kg)    Due to COVID19 we will limit examination to appearance. Patient had no complaints.  GENERAL:alert, no distress and comfortable SKIN: skin color normal, no rashes or significant lesions EYES: normal, Conjunctiva are pink and non-injected, sclera clear  NEURO: alert & oriented x 3 with fluent speech   LABORATORY DATA:  I have reviewed the data as listed CBC Latest Ref Rng & Units 03/22/2020 01/23/2020 10/24/2019  WBC 4.0 - 10.5 K/uL 8.3 8.2 5.9  Hemoglobin 12.0 - 15.0 g/dL 13.2 14.3 13.9  Hematocrit 36.0 - 46.0 % 42.6 45.3 42.8  Platelets 150 - 400 K/uL 255 241 185     CMP Latest Ref Rng & Units 03/22/2020 01/23/2020 10/24/2019  Glucose 70 - 99 mg/dL 111(H) 97 79  BUN 8 - 23 mg/dL 9 17 15   Creatinine 0.44 - 1.00 mg/dL 0.79 0.76 0.80  Sodium 135 - 145 mmol/L 142 141 140  Potassium 3.5 - 5.1 mmol/L 3.9 4.0 3.7  Chloride 98 - 111 mmol/L 107 103 103  CO2 22 - 32 mmol/L 27 28 26   Calcium 8.9 - 10.3 mg/dL 9.0 9.5 9.5  Total Protein 6.5 - 8.1 g/dL 7.2 7.7 7.5  Total Bilirubin 0.3 - 1.2 mg/dL 0.5 0.4 0.7  Alkaline Phos 38 - 126 U/L 91 95 93  AST 15 - 41 U/L 22 21 24   ALT 0 - 44 U/L 15 17 19       RADIOGRAPHIC STUDIES: I have personally reviewed the radiological images as listed and agreed with the findings in the report. No results found.   ASSESSMENT & PLAN:  Marie Levy is a 74 y.o. female with    1. 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 She underwenturgentsurgical resection on 02/20/19 due to bowel  perforation. Pathology shows moderately differentiated adenomacarcinoma, grade II, positivedistalmargins, 15nodes were allnegative. Her tumor was also found to be perforated.  -I discussed given her grade II disease, positive margins and perforation, she is at high risk of local and distant recurrence,especially peritoneal metastasis. -Dr. Raliegh Scarlet second surgery for positive margin, patient declined.She still has colostomy bag in place. -She had repeatedly declined recommended adjuvant radiation. -To reduce her risk of recurrence,she completed 8 cycles of adjuvant Xeloda in early11/2020.she declined more intensive chemo such as FOLFOX.  -Her CT CAP from 01/23/20 showedNED.  -She has  been having more rectal bleeding lately. She underwent colonoscopy with Dr Benson Norway on 03/11/2020. She was found to have a large colon cancer recurrence at the proximal portion of the Hartman's pouch (biopsy confirmed).  -We discussed her MRI pelvis from 04/19/20 which showed recurrence rectal carcinoma involving the proximal most portion of the Hartman's pouch along with involvement of 4 surrounding LNs. This is locally advanced cancer recurrence.  -I discussed standard treatment at this point is a larger surgical resection. I discussed her MRI with Dr white and he notes given the extent of her cancer, he may not be able to obtain complete resection and she may need intraop radiaton. I discussed a multidisciplinary approach including chemo and radiaiton to further down stage her cancer in preporation of surgery, -She notes she is not very interested in chemo or radiation unless it is her last resort. She is open to more holistic approaches. I discussed [facility] does have some alternative options. She is still open to surgery but would like a second opinion outside of [facility] including Dr. Hester Mates at [facility]. She is willing to consult with Dr. [name] as well. I encouraged her to be seen soon given  the longer she waits her cancer will progress. Currently she is overall asymptomatic.  -I will discuss her case in GI Tumor Board next week about other approaches that are possible.  -f/u open, she will call me after her surgical consults    2. Anemia, iron deficiency -Secondary to #1and dropped after surgery. Labs supported iron deficiency. -She currently onferrous sulfate65mg  1-2 tabs a day. She was given 1 dose IV Feraheme on 05/09/19. -Previously resolved  -She recently had increased rectal bleeding. Has stopped after colonoscopy in 03/2020. -She will continue oral iron.  -Will check her recent labs with Dr. Denny Peon. Will monitor her iron panel moving forward.   3.Hypothyroidism -On NP Synthroid 90mg  managed by PCP -Her5/14/20TSH levelat 6.944and increased to 27/041 in 08/2019.Ipreviouslysuggested shef/u with PCP   4. Watery stool with mild urgency, secondary to surgery, rectal discharge -She has watery stool which can be urgent at times. She is tolerating colostomy well.  -I recommend she use imodium to keep diarrhea controlled, but to avoid constipation. -She notes recent increased frequency of rectal discharge with occasional blood. Will notify Dr. Benson Norway as she has a colonoscopy next month. -Her BM are normal overall now with only drops of blood in her stool. She does have recent polyuria after her MRI which soul be related to her mass.    PLAN: -MRI reviewed with Pt -Send referral to Dr. Hester Mates at [facility], she will also see Dr. [name]  -F/u open, she will call me after her surgical consults    No problem-specific Assessment & Plan notes found for this encounter.   No orders of the defined types were placed in this encounter.  All questions were answered. The patient knows to call the clinic with any problems, questions or concerns. No barriers to learning was detected. The total time spent in the appointment was 30 minutes.     [name], MD 04/23/2020    I, Joslyn Devon, am acting as scribe for [name], MD.   I have reviewed the above documentation for accuracy and completeness, and I agree with the above.

## 2020-04-23 ENCOUNTER — Other Ambulatory Visit: Payer: Self-pay

## 2020-04-23 ENCOUNTER — Inpatient Hospital Stay: Payer: PPO

## 2020-04-23 ENCOUNTER — Inpatient Hospital Stay: Payer: PPO | Attending: Hematology | Admitting: Hematology

## 2020-04-23 VITALS — BP 112/78 | HR 109 | Temp 98.1°F | Resp 20 | Ht 66.0 in | Wt 137.1 lb

## 2020-04-23 DIAGNOSIS — R197 Diarrhea, unspecified: Secondary | ICD-10-CM | POA: Insufficient documentation

## 2020-04-23 DIAGNOSIS — C20 Malignant neoplasm of rectum: Secondary | ICD-10-CM

## 2020-04-23 DIAGNOSIS — E039 Hypothyroidism, unspecified: Secondary | ICD-10-CM | POA: Diagnosis not present

## 2020-04-23 DIAGNOSIS — D509 Iron deficiency anemia, unspecified: Secondary | ICD-10-CM | POA: Diagnosis not present

## 2020-04-23 DIAGNOSIS — Z933 Colostomy status: Secondary | ICD-10-CM | POA: Diagnosis not present

## 2020-04-23 DIAGNOSIS — Z9221 Personal history of antineoplastic chemotherapy: Secondary | ICD-10-CM | POA: Diagnosis not present

## 2020-04-25 ENCOUNTER — Encounter: Payer: Self-pay | Admitting: Hematology

## 2020-04-26 ENCOUNTER — Other Ambulatory Visit: Payer: Self-pay

## 2020-04-26 DIAGNOSIS — C20 Malignant neoplasm of rectum: Secondary | ICD-10-CM

## 2020-04-26 NOTE — Progress Notes (Signed)
Referral, last ov, last scans, surgical pathology report faxed to [facility], Dr. Hester Mates at [phone].

## 2020-04-28 ENCOUNTER — Other Ambulatory Visit: Payer: Self-pay

## 2020-06-16 ENCOUNTER — Telehealth: Payer: Self-pay

## 2020-06-16 NOTE — Telephone Encounter (Signed)
I spoke with Dunn from [facility].  He states that [facility] is outside of [place] [employer].  We will need to get an in network authorization.

## 2020-06-17 NOTE — Telephone Encounter (Signed)
I spoke with Marie Levy and let her know that [facility] is out of her insurance network and she will need to get autorization to go there.  I told her to contact her PCP for help with that.  She voiced interest in [facility] and [facility].  I asked her to call her insurance to see if either of those was in network and to call me back with that information.  She verbalized understanding.

## 2020-08-25 DIAGNOSIS — Z933 Colostomy status: Secondary | ICD-10-CM | POA: Diagnosis not present

## 2020-11-04 DIAGNOSIS — R634 Abnormal weight loss: Secondary | ICD-10-CM | POA: Diagnosis not present

## 2020-11-04 DIAGNOSIS — E039 Hypothyroidism, unspecified: Secondary | ICD-10-CM | POA: Diagnosis not present

## 2020-11-04 DIAGNOSIS — Z939 Artificial opening status, unspecified: Secondary | ICD-10-CM | POA: Diagnosis not present

## 2020-11-04 DIAGNOSIS — C20 Malignant neoplasm of rectum: Secondary | ICD-10-CM | POA: Diagnosis not present

## 2020-11-04 DIAGNOSIS — R1900 Intra-abdominal and pelvic swelling, mass and lump, unspecified site: Secondary | ICD-10-CM | POA: Diagnosis not present

## 2020-11-04 DIAGNOSIS — E559 Vitamin D deficiency, unspecified: Secondary | ICD-10-CM | POA: Diagnosis not present

## 2020-11-09 DIAGNOSIS — Z933 Colostomy status: Secondary | ICD-10-CM | POA: Diagnosis not present

## 2020-12-26 DIAGNOSIS — Z23 Encounter for immunization: Secondary | ICD-10-CM | POA: Diagnosis not present

## 2020-12-26 DIAGNOSIS — S61411A Laceration without foreign body of right hand, initial encounter: Secondary | ICD-10-CM | POA: Diagnosis not present

## 2021-01-09 DIAGNOSIS — Z4802 Encounter for removal of sutures: Secondary | ICD-10-CM | POA: Diagnosis not present

## 2021-04-20 DIAGNOSIS — Z433 Encounter for attention to colostomy: Secondary | ICD-10-CM | POA: Diagnosis not present

## 2021-04-25 DIAGNOSIS — R634 Abnormal weight loss: Secondary | ICD-10-CM | POA: Diagnosis not present

## 2021-04-25 DIAGNOSIS — E559 Vitamin D deficiency, unspecified: Secondary | ICD-10-CM | POA: Diagnosis not present

## 2021-04-25 DIAGNOSIS — E039 Hypothyroidism, unspecified: Secondary | ICD-10-CM | POA: Diagnosis not present

## 2021-04-25 DIAGNOSIS — C20 Malignant neoplasm of rectum: Secondary | ICD-10-CM | POA: Diagnosis not present

## 2021-04-25 DIAGNOSIS — R1900 Intra-abdominal and pelvic swelling, mass and lump, unspecified site: Secondary | ICD-10-CM | POA: Diagnosis not present

## 2021-04-25 DIAGNOSIS — R109 Unspecified abdominal pain: Secondary | ICD-10-CM | POA: Diagnosis not present

## 2021-04-25 DIAGNOSIS — N8110 Cystocele, unspecified: Secondary | ICD-10-CM | POA: Diagnosis not present

## 2021-06-13 ENCOUNTER — Telehealth: Payer: Self-pay

## 2021-06-13 NOTE — Telephone Encounter (Signed)
This nurse returned call to patient who left a message requesting a call back.  Patient states she has questions about her type of cancer.  No answer, left a message stating that this nurse will return call on the next business day.

## 2021-08-18 DIAGNOSIS — E559 Vitamin D deficiency, unspecified: Secondary | ICD-10-CM | POA: Diagnosis not present

## 2021-08-18 DIAGNOSIS — D520 Dietary folate deficiency anemia: Secondary | ICD-10-CM | POA: Diagnosis not present

## 2021-08-18 DIAGNOSIS — Z0001 Encounter for general adult medical examination with abnormal findings: Secondary | ICD-10-CM | POA: Diagnosis not present

## 2021-08-18 DIAGNOSIS — R5383 Other fatigue: Secondary | ICD-10-CM | POA: Diagnosis not present

## 2021-08-18 DIAGNOSIS — K529 Noninfective gastroenteritis and colitis, unspecified: Secondary | ICD-10-CM | POA: Diagnosis not present

## 2021-08-18 DIAGNOSIS — R634 Abnormal weight loss: Secondary | ICD-10-CM | POA: Diagnosis not present

## 2021-08-18 DIAGNOSIS — R1900 Intra-abdominal and pelvic swelling, mass and lump, unspecified site: Secondary | ICD-10-CM | POA: Diagnosis not present

## 2021-08-18 DIAGNOSIS — Z939 Artificial opening status, unspecified: Secondary | ICD-10-CM | POA: Diagnosis not present

## 2021-08-18 DIAGNOSIS — R109 Unspecified abdominal pain: Secondary | ICD-10-CM | POA: Diagnosis not present

## 2021-08-18 DIAGNOSIS — D519 Vitamin B12 deficiency anemia, unspecified: Secondary | ICD-10-CM | POA: Diagnosis not present

## 2021-08-18 DIAGNOSIS — E039 Hypothyroidism, unspecified: Secondary | ICD-10-CM | POA: Diagnosis not present

## 2021-09-08 DIAGNOSIS — E039 Hypothyroidism, unspecified: Secondary | ICD-10-CM | POA: Diagnosis not present

## 2021-09-08 DIAGNOSIS — E559 Vitamin D deficiency, unspecified: Secondary | ICD-10-CM | POA: Diagnosis not present

## 2021-09-08 DIAGNOSIS — C20 Malignant neoplasm of rectum: Secondary | ICD-10-CM | POA: Diagnosis not present

## 2021-09-08 DIAGNOSIS — Z939 Artificial opening status, unspecified: Secondary | ICD-10-CM | POA: Diagnosis not present

## 2021-09-22 ENCOUNTER — Telehealth: Payer: Self-pay | Admitting: *Deleted

## 2021-09-22 NOTE — Telephone Encounter (Signed)
Spoke with patient and Joelene Millin with [employer]. They just need for patient to have routine genetic testing. Patient will send results to [employer] to see if she qualifies for any clinical trials.

## 2021-10-24 ENCOUNTER — Telehealth: Payer: Self-pay

## 2021-10-24 DIAGNOSIS — C20 Malignant neoplasm of rectum: Secondary | ICD-10-CM

## 2021-10-24 NOTE — Telephone Encounter (Signed)
Pt called looking for genetic testing results.  Informed pt that no genetic testing was done.  Spoke with Dr. Burr Medico and a referral to our Genetic Counselor was placed.

## 2021-10-31 ENCOUNTER — Encounter: Payer: PPO | Admitting: Licensed Clinical Social Worker

## 2021-11-09 ENCOUNTER — Other Ambulatory Visit: Payer: Self-pay | Admitting: Licensed Clinical Social Worker

## 2021-11-09 DIAGNOSIS — C20 Malignant neoplasm of rectum: Secondary | ICD-10-CM

## 2021-11-14 ENCOUNTER — Encounter: Payer: PPO | Admitting: Licensed Clinical Social Worker

## 2021-11-14 ENCOUNTER — Inpatient Hospital Stay: Payer: PPO | Attending: Genetic Counselor | Admitting: Licensed Clinical Social Worker

## 2021-11-14 ENCOUNTER — Other Ambulatory Visit: Payer: Self-pay

## 2021-11-14 ENCOUNTER — Encounter: Payer: Self-pay | Admitting: Licensed Clinical Social Worker

## 2021-11-14 ENCOUNTER — Inpatient Hospital Stay: Payer: PPO

## 2021-11-14 ENCOUNTER — Other Ambulatory Visit: Payer: PPO

## 2021-11-14 DIAGNOSIS — Z801 Family history of malignant neoplasm of trachea, bronchus and lung: Secondary | ICD-10-CM | POA: Insufficient documentation

## 2021-11-14 DIAGNOSIS — C20 Malignant neoplasm of rectum: Secondary | ICD-10-CM | POA: Diagnosis not present

## 2021-11-14 DIAGNOSIS — Z800 Family history of malignant neoplasm of digestive organs: Secondary | ICD-10-CM

## 2021-11-14 DIAGNOSIS — Z808 Family history of malignant neoplasm of other organs or systems: Secondary | ICD-10-CM

## 2021-11-14 LAB — GENETIC SCREENING ORDER

## 2021-11-14 NOTE — Progress Notes (Signed)
REFERRING PROVIDER: [name], MD [address] [place],  [place] [address]  PRIMARY PROVIDER:  [name], MD  PRIMARY REASON FOR VISIT:  1. Rectal cancer ([place])   2. Family history of colon cancer   3. Family history of melanoma   4. Family history of lung cancer      HISTORY OF PRESENT ILLNESS:   Marie Levy, a 75 y.o. female, was seen for a [place] cancer genetics consultation at the request of Dr. Burr Medico due to a personal and family history of cancer.  Marie Levy presents to clinic today to discuss the possibility of a hereditary predisposition to cancer, genetic testing, and to further clarify her future cancer risks, as well as potential cancer risks for family members.   In 2020, at the age of 75, Marie Levy was diagnosed with rectal cancer, stage IIA. The treatment plan included resection, completed on 02/20/2019, MMR (IHC) was  normal (intact) and MSI was stable.Colonoscopy in May 2021 showed recurrence of rectal carcinoma, also had normal MMR IHC.   CANCER HISTORY:  Oncology History Overview Note  Cancer Staging Rectal cancer [facility]) Staging form: Colon and Rectum, AJCC 8th Edition - Pathologic stage from 02/20/2019: Stage IIA (pT3, pN0, cM0) - Signed by [name], MD on 02/24/2019     Rectal cancer ([place])  02/08/2019 Imaging   CT AP W Contrast 02/08/19  IMPRESSION: 1. The rectum and sigmoid colon are markedly abnormal with wall thickening, somewhat masslike in appearance in some regions. There is also a region of stricture/narrowing resulting in partial obstruction with fecal loading throughout the remainder of the colon. The patient is at risk of developing a high-grade obstruction. The constellation of findings could be due to an underlying malignancy. A benign stricture with a superimposed colitis/proctitis is a possibility. An infectious or inflammatory colitis are also possibilities. Recommend consultation with gastroenterology. The patient will likely  require direct visualization to determine the underlying etiology. Again, malignancy is not excluded. 2. Gastric distention. There is wall thickening in the region of the antrum just distal to the distension. Recommend direct visualization or upper GI. 3. No other significant abnormalities.   02/11/2019 Procedure   Flexible Sigmoidoscopy 02/11/19 by Dr. Benson Norway IMPRESSION -a malignant completely obstructing tumor in the recto-sigmoid colon - biopsied.   02/11/2019 Initial Biopsy   Colon, rectosigmoid mass biopsy --Invasive Adenocarcinoma, moderately differentiated   02/17/2019 Imaging   CT Chest W Contrast 02/17/19  IMPRESSION: No evidence of metastatic disease in the chest.    02/20/2019 Imaging   CT CP W Contrast 02/20/19  IMPRESSION: 1. Pneumoperitoneum indicating a perforated loop of bowel. There appears to be a 4 cm extraluminal collection of gas and fluid central in the pelvis anterior to the sigmoid colon (series 2, image 68). This is in the region of distal large bowel tumor as described on 02/07/2019. Critical Value/emergent results were called by telephone at the time of interpretation on 02/20/2019 at 1933 hours to PA-C [name] , who verbally acknowledged these results. 2. Small volume of free fluid and free air scattered in the bilateral abdomen. Generalized mesenteric inflammation. 3. No metastatic disease identified.   02/20/2019 Surgery   EXPLORATORY LAPAROTOMY with LOWER ANTERIOR RESECTION WITH COLOSTOMY by Dr. [name]  02/20/19    02/20/2019 Cancer Staging   Staging form: Colon and Rectum, AJCC 8th Edition - Pathologic stage from 02/20/2019: Stage IIA (pT3, pN0, cM0) - Signed by [name], MD on 02/24/2019    02/20/2019 Pathology Results  Surgical Pathology  Diagnosis Colon, segmental resection for tumor, recto-sigmoid colon - INVASIVE ADENOCARCINOMA, MODERATELY DIFFERENTIATED, SPANNING 4 CM. - TUMOR INVOLVES PERICOLONIC SOFT TISSUE. - DIVERTICULITIS WITH  ABSCESS. - SEROSITIS. - DISTAL RESECTION MARGIN IS POSITIVE FOR CARCINOMA. - FIFTEEN OF FIFTEEN LYMPH NODES NEGATIVE FOR CARCINOMA (0/15). - SEE ONCOLOGY TABLE.    02/24/2019 Initial Diagnosis   Rectal cancer ([place])   02/25/2019 Imaging   CT AP 02/25/19  IMPRESSION: 1. Postop changes from low anterior resection with left lower quadrant colostomy. Postop ileus. No evidence of abscess or bowel obstruction. 2. 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   04/21/2019 -  Chemotherapy   Adjuvant Xeloda 1526m BID 2 weeks on and 1 week off, every 21 day starting on 04/21/19 completed 8 cycles first week of November.    01/23/2020 Imaging   CT CAP W contrast  IMPRESSION: No acute findings. No evidence of recurrent or metastatic carcinoma, or other significant abnormality.   04/19/2020 Imaging   MRI Pelvis   IMPRESSION: Recurrent rectal carcinoma involving the proximal most portion of the Hartman pouch, at the level of the surgical anastomosis. Although this is a recurrent carcinoma following surgical resection and chemotherapy, stage by imaging is:   Rectal adenocarcinoma T stage: T3d   Rectal adenocarcinoma N stage:  N2   Distance from tumor to the internal anal sphincter is 6.4 cm.      RISK FACTORS:  Menarche was at age 75  First live birth at age 75  Ovaries intact: yes.  Hysterectomy: no.  Menopausal status: postmenopausal.  Number of breast biopsies: 0.  Past Medical History:  Diagnosis Date   Family history of colon cancer    Family history of lung cancer    Family history of melanoma    Rectal cancer ([place] dx'd 02/2019   Thyroid disease     Past Surgical History:  Procedure Laterality Date   COLECTOMY WITH COLOSTOMY CREATION/HARTMANN PROCEDURE N/A 02/20/2019   Procedure: LOWER ANTERIOR RESECTION WITH COLOSTOMY;  Surgeon: WIleana Roup MD;  Location: [facility];  Service: General;   Laterality: N/A;   LAPAROTOMY N/A 02/20/2019   Procedure: EXPLORATORY LAPAROTOMY;  Surgeon: WIleana Roup MD;  Location: [facility];  Service: General;  Laterality: N/A;   TONSILLECTOMY      Social History   Socioeconomic History   Marital status: Single    Spouse name: Not on file   Number of children: 2   Years of education: Not on file   Highest education level: Not on file  Occupational History   Not on file  Tobacco Use   Smoking status: Never   Smokeless tobacco: Never  Vaping Use   Vaping Use: Never used  Substance and Sexual Activity   Alcohol use: Not Currently   Drug use: Never   Sexual activity: Not on file  Other Topics Concern   Not on file  Social History Narrative   Not on file   Social Determinants of Health   Financial Resource Strain: Not on file  Food Insecurity: Not on file  Transportation Needs: Not on file  Physical Activity: Not on file  Stress: Not on file  Social Connections: Not on file     FAMILY HISTORY:  We obtained a detailed, 4-generation family history.  Significant diagnoses are listed below: Family History  Problem Relation Age of Onset   Colon cancer Mother 530  Melanoma Father  dx 32s   Cancer Brother        lung cancer    Cancer Maternal Uncle        unknown type cancer    Cancer Maternal Uncle        unknown type cancer    Marie Levy had 2 brothers, one had lung cancer and died in his 35s. She has 1 son, 61 and 1 daughter, 52, who both have had colonoscopies.   Marie Levy mother had colon cancer at 38 and died at 79. Patient had 2 maternal uncles, 2 aunts. Both of her uncles had unknown cancers. No known cancers in cousins or grandparents.  Marie Levy father had melanoma in his 23s and died in his 80s. Patient had 5 paternal uncles,1  aunt, no known cancers. No known cancers in paternal cousins or grandparents.  Marie Levy is unaware of previous family history of genetic testing for hereditary cancer  risks. Patient's maternal ancestors are of [place] descent, and paternal ancestors are of [place] descent. There is no reported Ashkenazi Jewish ancestry. There is no known consanguinity.    GENETIC COUNSELING ASSESSMENT: Marie Levy is a 75 y.o. female with a personal and family history of cancer which is somewhat suggestive of a hereditary cancer syndrome and predisposition to cancer. We, therefore, discussed and recommended the following at today's visit.   DISCUSSION: We discussed that approximately 10% of colorectal cancer is hereditary. Most cases of hereditary colorectal cancer are associated with Lynch syndrome genes, although the testing done on her tumor was normal, indicating it is unlikely she has Lynch syndrome. There are other genes associated with hereditary cancer as well. Cancers and risks are gene specific.  We discussed that testing is beneficial for several reasons including knowing about other cancer risks, identifying potential screening and risk-reduction options that may be appropriate, and to understand if other family members could be at risk for cancer and allow them to undergo genetic testing.   We reviewed the characteristics, features and inheritance patterns of hereditary cancer syndromes. We also discussed genetic testing, including the appropriate family members to test, the process of testing, insurance coverage and turn-around-time for results. We discussed the implications of a negative, positive and/or variant of uncertain significant result. We recommended Marie Levy pursue genetic testing for the [facility] Multi-Cancer+RNA gene panel.   The Multi-Cancer Panel + RNA offered by [facility] includes sequencing and/or deletion duplication testing of the following 84 genes: AIP, ALK, APC, ATM, AXIN2,BAP1,  BARD1, BLM, BMPR1A, BRCA1, BRCA2, BRIP1, CASR, CDC73, CDH1, CDK4, CDKN1B, CDKN1C, CDKN2A (p14ARF), CDKN2A (p16INK4a), CEBPA, CHEK2, CTNNA1, DICER1, DIS3L2, EGFR (c.2369C>T,  p.Thr790Met variant only), EPCAM (Deletion/duplication testing only), FH, FLCN, GATA2, GPC3, GREM1 (Promoter region deletion/duplication testing only), HOXB13 (c.251G>A, p.Gly84Glu), HRAS, KIT, MAX, MEN1, MET, MITF (c.952G>A, p.Glu318Lys variant only), MLH1, MSH2, MSH3, MSH6, MUTYH, NBN, NF1, NF2, NTHL1, PALB2, PDGFRA, PHOX2B, PMS2, POLD1, POLE, POT1, PRKAR1A, PTCH1, PTEN, RAD50, RAD51C, RAD51D, RB1, RECQL4, RET, RUNX1, SDHAF2, SDHA (sequence changes only), SDHB, SDHC, SDHD, SMAD4, SMARCA4, SMARCB1, SMARCE1, STK11, SUFU, TERC, TERT, TMEM127, TP53, TSC1, TSC2, VHL, WRN and WT1.  Based on Marie Levy's personal and family history of cancer, she meets medical criteria for genetic testing. Despite that she meets criteria, she may still have an out of pocket cost. We discussed that if her out of pocket cost for testing is over $100, the laboratory will call and confirm whether she wants to proceed with testing.  If the out of pocket cost of testing is less  than $100 she will be billed by the genetic testing laboratory.   PLAN: After considering the risks, benefits, and limitations, Marie Levy provided informed consent to pursue genetic testing and the blood sample was sent to [facility] for analysis of the Multi-Cancer+RNA panel. Results should be available within approximately 2-3 weeks' time, at which point they will be disclosed by telephone to Marie Levy, as will any additional recommendations warranted by these results. Marie Levy will receive a summary of her genetic counseling visit and a copy of her results once available. This information will also be available in Epic.   Marie Levy questions were answered to her satisfaction today. Our contact information was provided should additional questions or concerns arise. Thank you for the referral and allowing [place] to share in the care of your patient.   Faith Rogue, MS, [facility] Genetic Counselor [email or web address] Phone:  [phone]  The patient was seen for a total of 30 minutes in face-to-face genetic counseling.  Patient was seen alone. Dr. Grayland Ormond was available for discussion regarding this case.   _______________________________________________________________________ For Office Staff:  Number of people involved in session: 1 Was an Intern/ student involved with case: no

## 2021-12-06 ENCOUNTER — Telehealth: Payer: Self-pay | Admitting: Licensed Clinical Social Worker

## 2021-12-06 ENCOUNTER — Ambulatory Visit: Payer: Self-pay | Admitting: Licensed Clinical Social Worker

## 2021-12-06 ENCOUNTER — Encounter: Payer: Self-pay | Admitting: Licensed Clinical Social Worker

## 2021-12-06 DIAGNOSIS — C20 Malignant neoplasm of rectum: Secondary | ICD-10-CM

## 2021-12-06 DIAGNOSIS — Z1379 Encounter for other screening for genetic and chromosomal anomalies: Secondary | ICD-10-CM

## 2021-12-06 DIAGNOSIS — Z800 Family history of malignant neoplasm of digestive organs: Secondary | ICD-10-CM

## 2021-12-06 DIAGNOSIS — Z808 Family history of malignant neoplasm of other organs or systems: Secondary | ICD-10-CM

## 2021-12-06 DIAGNOSIS — Z801 Family history of malignant neoplasm of trachea, bronchus and lung: Secondary | ICD-10-CM

## 2021-12-06 NOTE — Progress Notes (Signed)
HPI:  Marie Levy was previously seen in the [place] clinic due to a personal and family history of cancer and concerns regarding a hereditary predisposition to cancer. Please refer to our prior cancer genetics clinic note for more information regarding our discussion, assessment and recommendations, at the time. Marie Levy recent genetic test results were disclosed to her, as were recommendations warranted by these results. These results and recommendations are discussed in more detail below.  CANCER HISTORY:  Oncology History Overview Note  Cancer Staging Rectal cancer [facility]) Staging form: Colon and Rectum, AJCC 8th Edition - Pathologic stage from 02/20/2019: Stage IIA (pT3, pN0, cM0) - Signed by [name], MD on 02/24/2019     Rectal cancer ([place])  02/08/2019 Imaging   CT AP W Contrast 02/08/19  IMPRESSION: 1. The rectum and sigmoid colon are markedly abnormal with wall thickening, somewhat masslike in appearance in some regions. There is also a region of stricture/narrowing resulting in partial obstruction with fecal loading throughout the remainder of the colon. The patient is at risk of developing a high-grade obstruction. The constellation of findings could be due to an underlying malignancy. A benign stricture with a superimposed colitis/proctitis is a possibility. An infectious or inflammatory colitis are also possibilities. Recommend consultation with gastroenterology. The patient will likely require direct visualization to determine the underlying etiology. Again, malignancy is not excluded. 2. Gastric distention. There is wall thickening in the region of the antrum just distal to the distension. Recommend direct visualization or upper GI. 3. No other significant abnormalities.   02/11/2019 Procedure   Flexible Sigmoidoscopy 02/11/19 by Dr. Benson Norway IMPRESSION -a malignant completely obstructing tumor in the recto-sigmoid colon - biopsied.   02/11/2019 Initial  Biopsy   Colon, rectosigmoid mass biopsy --Invasive Adenocarcinoma, moderately differentiated   02/17/2019 Imaging   CT Chest W Contrast 02/17/19  IMPRESSION: No evidence of metastatic disease in the chest.    02/20/2019 Imaging   CT CP W Contrast 02/20/19  IMPRESSION: 1. Pneumoperitoneum indicating a perforated loop of bowel. There appears to be a 4 cm extraluminal collection of gas and fluid central in the pelvis anterior to the sigmoid colon (series 2, image 68). This is in the region of distal large bowel tumor as described on 02/07/2019. Critical Value/emergent results were called by telephone at the time of interpretation on 02/20/2019 at 1933 hours to PA-C [name] , who verbally acknowledged these results. 2. Small volume of free fluid and free air scattered in the bilateral abdomen. Generalized mesenteric inflammation. 3. No metastatic disease identified.   02/20/2019 Surgery   EXPLORATORY LAPAROTOMY with LOWER ANTERIOR RESECTION WITH COLOSTOMY by Dr. [name]  02/20/19    02/20/2019 Cancer Staging   Staging form: Colon and Rectum, AJCC 8th Edition - Pathologic stage from 02/20/2019: Stage IIA (pT3, pN0, cM0) - Signed by [name], MD on 02/24/2019    02/20/2019 Pathology Results   Surgical Pathology  Diagnosis Colon, segmental resection for tumor, recto-sigmoid colon - INVASIVE ADENOCARCINOMA, MODERATELY DIFFERENTIATED, SPANNING 4 CM. - TUMOR INVOLVES PERICOLONIC SOFT TISSUE. - DIVERTICULITIS WITH ABSCESS. - SEROSITIS. - DISTAL RESECTION MARGIN IS POSITIVE FOR CARCINOMA. - FIFTEEN OF FIFTEEN LYMPH NODES NEGATIVE FOR CARCINOMA (0/15). - SEE ONCOLOGY TABLE.    02/24/2019 Initial Diagnosis   Rectal cancer ([place])   02/25/2019 Imaging   CT AP 02/25/19  IMPRESSION: 1. Postop changes from low anterior resection with left lower quadrant colostomy. Postop ileus. No evidence of abscess or bowel obstruction. 2. Increased diffuse body wall  and mesenteric edema and  small bilateral pleural effusions, consistent with 3rd spacing.   04/21/2019 -  Chemotherapy   Adjuvant Xeloda 1552m BID 2 weeks on and 1 week off, every 21 day starting on 04/21/19 completed 8 cycles first week of November.    01/23/2020 Imaging   CT CAP W contrast  IMPRESSION: No acute findings. No evidence of recurrent or metastatic carcinoma, or other significant abnormality.   04/19/2020 Imaging   MRI Pelvis   IMPRESSION: Recurrent rectal carcinoma involving the proximal most portion of the Hartman pouch, at the level of the surgical anastomosis. Although this is a recurrent carcinoma following surgical resection and chemotherapy, stage by imaging is:   Rectal adenocarcinoma T stage: T3d   Rectal adenocarcinoma N stage:  N2   Distance from tumor to the internal anal sphincter is 6.4 cm.    Genetic Testing   Negative genetic testing. No pathogenic variants identified on the [facility] Multi-Cancer+RNA panel. The report date is 12/03/2021.  The Multi-Cancer Panel + RNA offered by [facility] includes sequencing and/or deletion duplication testing of the following 84 genes: AIP, ALK, APC, ATM, AXIN2,BAP1,  BARD1, BLM, BMPR1A, BRCA1, BRCA2, BRIP1, CASR, CDC73, CDH1, CDK4, CDKN1B, CDKN1C, CDKN2A (p14ARF), CDKN2A (p16INK4a), CEBPA, CHEK2, CTNNA1, DICER1, DIS3L2, EGFR (c.2369C>T, p.Thr790Met variant only), EPCAM (Deletion/duplication testing only), FH, FLCN, GATA2, GPC3, GREM1 (Promoter region deletion/duplication testing only), HOXB13 (c.251G>A, p.Gly84Glu), HRAS, KIT, MAX, MEN1, MET, MITF (c.952G>A, p.Glu318Lys variant only), MLH1, MSH2, MSH3, MSH6, MUTYH, NBN, NF1, NF2, NTHL1, PALB2, PDGFRA, PHOX2B, PMS2, POLD1, POLE, POT1, PRKAR1A, PTCH1, PTEN, RAD50, RAD51C, RAD51D, RB1, RECQL4, RET, RUNX1, SDHAF2, SDHA (sequence changes only), SDHB, SDHC, SDHD, SMAD4, SMARCA4, SMARCB1, SMARCE1, STK11, SUFU, TERC, TERT, TMEM127, TP53, TSC1, TSC2, VHL, WRN and WT1.     FAMILY HISTORY:  We obtained a  detailed, 4-generation family history.  Significant diagnoses are listed below: Family History  Problem Relation Age of Onset   Colon cancer Mother 525  Melanoma Father        dx 891s  Cancer Brother        lung cancer    Cancer Maternal Uncle        unknown type cancer    Cancer Maternal Uncle        unknown type cancer     Ms. HManushad 2 brothers, one had lung cancer and died in his 742s She has 1 son, 449and 1 daughter, 556 who both have had colonoscopies.    Ms. HBalestrierimother had colon cancer at 523and died at 580 Patient had 2 maternal uncles, 2 aunts. Both of her uncles had unknown cancers. No known cancers in cousins or grandparents.   Ms. HDisbrowfather had melanoma in his 825sand died in his 819s Patient had 5 paternal uncles,1  aunt, no known cancers. No known cancers in paternal cousins or grandparents.   Ms. HBettyis unaware of previous family history of genetic testing for hereditary cancer risks. Patient's maternal ancestors are of [place]cent, and paternal ancestors are of [place]. There is no reported Ashkenazi Jewish ancestry. There is no known consanguinity.     GENETIC TEST RESULTS: Genetic testing reported out on 12/06/2020 through the [facility] Multi-Cancer+RNA cancer panel found no pathogenic mutations.   The Multi-Cancer Panel + RNA offered by [facility] includes sequencing and/or deletion duplication testing of the following 84 genes: AIP, ALK, APC, ATM, AXIN2,BAP1,  BARD1, BLM, BMPR1A, BRCA1, BRCA2, BRIP1, CASR, CDC73, CDH1, CDK4, CDKN1B, CDKN1C, CDKN2A (p14ARF), CDKN2A (  p16INK4a), CEBPA, CHEK2, CTNNA1, DICER1, DIS3L2, EGFR (c.2369C>T, p.Thr790Met variant only), EPCAM (Deletion/duplication testing only), FH, FLCN, GATA2, GPC3, GREM1 (Promoter region deletion/duplication testing only), HOXB13 (c.251G>A, p.Gly84Glu), HRAS, KIT, MAX, MEN1, MET, MITF (c.952G>A, p.Glu318Lys variant only), MLH1, MSH2, MSH3, MSH6, MUTYH, NBN, NF1, NF2, NTHL1, PALB2, PDGFRA,  PHOX2B, PMS2, POLD1, POLE, POT1, PRKAR1A, PTCH1, PTEN, RAD50, RAD51C, RAD51D, RB1, RECQL4, RET, RUNX1, SDHAF2, SDHA (sequence changes only), SDHB, SDHC, SDHD, SMAD4, SMARCA4, SMARCB1, SMARCE1, STK11, SUFU, TERC, TERT, TMEM127, TP53, TSC1, TSC2, VHL, WRN and WT1.  The test report has been scanned into EPIC and is located under the Molecular Pathology section of the Results Review tab.  A portion of the result report is included below for reference.    We discussed that because current genetic testing is not perfect, it is possible there may be a gene mutation in one of these genes that current testing cannot detect, but that chance is small.  There could be another gene that has not yet been discovered, or that we have not yet tested, that is responsible for the cancer diagnoses in the family. It is also possible there is a hereditary cause for the cancer in the family that Marie Levy did not inherit and therefore was not identified in her testing.  Therefore, it is important to remain in touch with cancer genetics in the future so that we can continue to offer Marie Levy the most up to date genetic testing.   ADDITIONAL GENETIC TESTING: We discussed with Marie Levy that her genetic testing was fairly extensive.  If there are genes identified to increase cancer risk that can be analyzed in the future, we would be happy to discuss and coordinate this testing at that time.    CANCER SCREENING RECOMMENDATIONS: Marie Levy's test result is considered negative (normal).  This means that we have not identified a hereditary cause for her  personal and family history of cancer at this time. Most cancers happen by chance and this negative test suggests that her cancer may fall into this category.    While reassuring, this does not definitively rule out a hereditary predisposition to cancer. It is still possible that there could be genetic mutations that are undetectable by current technology. There could be  genetic mutations in genes that have not been tested or identified to increase cancer risk.  Therefore, it is recommended she continue to follow the cancer management and screening guidelines provided by her oncology and primary healthcare provider.   An individual's cancer risk and medical management are not determined by genetic test results alone. Overall cancer risk assessment incorporates additional factors, including personal medical history, family history, and any available genetic information that may result in a personalized plan for cancer prevention and surveillance.  RECOMMENDATIONS FOR FAMILY MEMBERS:  Relatives in this family might be at some increased risk of developing cancer, over the general population risk, simply due to the family history of cancer.  We recommended female relatives in this family have a yearly mammogram beginning at age 57, or 60 years younger than the earliest onset of cancer, an annual clinical breast exam, and perform monthly breast self-exams. Female relatives in this family should also have a gynecological exam as recommended by their primary provider.  All family members should be referred for colonoscopy starting at age 77.   FOLLOW-UP: Lastly, we discussed with Marie Levy that cancer genetics is a rapidly advancing field and it is possible that new genetic tests will be appropriate for her  and/or her family members in the future. We encouraged her to remain in contact with cancer genetics on an annual basis so we can update her personal and family histories and let her know of advances in cancer genetics that may benefit this family.   Our contact number was provided. Marie Levy questions were answered to her satisfaction, and she knows she is welcome to call us at anytime with additional questions or concerns.   Faith Rogue, MS, [facility] Genetic Counselor [email or web address] Phone: [phone]

## 2021-12-06 NOTE — Telephone Encounter (Signed)
Revealed negative genetic testing.  This normal result is reassuring and indicates that it is unlikely Marie Levy's cancer is due to a hereditary cause.  It is unlikely that there is an increased risk of another cancer due to a mutation in one of these genes.  However, genetic testing is not perfect, and cannot definitively rule out a hereditary cause.  It will be important for her to keep in contact with genetics to learn if any additional testing may be needed in the future.

## 2022-02-06 ENCOUNTER — Other Ambulatory Visit: Payer: Self-pay | Admitting: Gastroenterology

## 2022-02-06 DIAGNOSIS — C19 Malignant neoplasm of rectosigmoid junction: Secondary | ICD-10-CM

## 2022-03-03 ENCOUNTER — Ambulatory Visit
Admission: RE | Admit: 2022-03-03 | Discharge: 2022-03-03 | Disposition: A | Discharge status: Home / Self Care | Payer: PPO | Source: Ambulatory Visit | Attending: Gastroenterology | Admitting: Gastroenterology

## 2022-03-03 DIAGNOSIS — C19 Malignant neoplasm of rectosigmoid junction: Secondary | ICD-10-CM

## 2022-03-03 MED ORDER — IOPAMIDOL (ISOVUE-300) INJECTION 61%
100.0000 mL | Freq: Once | INTRAVENOUS | Status: AC | PRN
  Administered 2022-03-03: 100 mL via INTRAVENOUS

## 2022-05-05 ENCOUNTER — Encounter (HOSPITAL_COMMUNITY): Payer: Self-pay

## 2022-11-20 ENCOUNTER — Encounter: Payer: Self-pay | Admitting: Family Medicine

## 2022-11-20 ENCOUNTER — Non-Acute Institutional Stay: Payer: PPO | Admitting: Family Medicine

## 2022-11-20 VITALS — BP 118/68 | HR 89 | Temp 98.8°F | Resp 16

## 2022-11-20 DIAGNOSIS — G8929 Other chronic pain: Secondary | ICD-10-CM | POA: Insufficient documentation

## 2022-11-20 DIAGNOSIS — Z9889 Other specified postprocedural states: Secondary | ICD-10-CM

## 2022-11-20 DIAGNOSIS — E43 Unspecified severe protein-calorie malnutrition: Secondary | ICD-10-CM | POA: Insufficient documentation

## 2022-11-20 DIAGNOSIS — L89154 Pressure ulcer of sacral region, stage 4: Secondary | ICD-10-CM | POA: Insufficient documentation

## 2022-11-20 DIAGNOSIS — C7951 Secondary malignant neoplasm of bone: Secondary | ICD-10-CM

## 2022-11-20 NOTE — Progress Notes (Signed)
[facility] Pall[facility] Care Consult Note Telephone: [phone]  Fax: [phone]   Date of encounter: 11/20/22 12:33 PM PATIENT NAME: Marie Levy [address] [place] [id number]   [phone] (home)  DOB: [birth year] MRN: [id number] PRIMARY CARE PROVIDER:    [name], MD,  [place] [address] [phone]  REFERRING PROVIDER:   [name], MD [address] [place],  [place] [address] [phone]  RESPONSIBLE PARTY:    Contact Information     Name Relation Home Work [place] Son [phone]     [facility] Other [phone]     Marie Levy Daughter   [phone]        I met face to face with patient in [facility]. Palliative Care was asked to follow this patient by consultation request of  Marie Rasmussen, MD to address advance care planning and complex medical decision making. This is the initial visit.          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 Recommend to continue Morphine ER 30 mg TID. D/c Oxycodone 15 mg Q 3hrs prn breakthrough when new med available.   Dilaudid 4 mg po Q 3 hrs prn breakthrough pain. If pt gets better relief with Dilaudid than OxyCodone but needs additional coverage will consider Gabapentin 100 mg BID to help with pain relief in 3-4 days. Goal to improve ability to work with PT to improve mobility without excess sedation and with improved pain control  Sacral stage IV pressure ulcer Unstageable wound debrided today per Wound Care Physician with resulting Stage IV sacral wound. At risk for poor wound healing due to bony prominences, poor ability to shift and redistribute weight and severe protein calorie malnutrition. Wound care regimen changed per wound doc today to  [facility] for central eschar debridement, cleaning with Dakin's solution then covering with ABD . Agree with air mattress for bed which facility states is to be delivered tomorrow. Encouraged increased protein intake.  3.   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 Working to optimize pain control without sedation and improve mobility.  4.   Severe protein calorie malnutrition Noted severe hypoalbuminemia, last Albumin 1.5 on 11/14/22 Encouraged pt to increase protein supplementation and she states allergies to gluten and lactose. Recommend for dietitian to work with pt on acceptable protein source to help pt achieve sufficient protein intake to build muscle and allow healing. May need to involve family to help bring in acceptable supplement for pt that meets needs for which pt is accepting.       Follow up Palliative Care Visit: Palliative care will continue to follow for complex medical decision making, advance care planning, and clarification of goals. Return 1-2 weeks or prn.    This visit was coded based on medical decision making (MDM).  PPS: 30%  HOSPICE ELIGIBILITY/DIAGNOSIS: TBD  Chief Complaint:  Marie Levy received a referral to follow up with patient for chronic disease management in setting of rectal cancer with metastasis to bone and fracture with girdlestone repair.  Palliative is also followig to assist with advance directive planning and defining/refining goals of care.  HISTORY OF PRESENT ILLNESS:  Marie Levy is a 76 y.o. year old female with hx of colostomy that predates rectal cancer diagnosis. She has rectal cancer which had invaded her left hip bone causing a chronic fracture and displacing the femoral head.  She underwent a Girdlestone procedure to remove the  femoral head and stabilize the bone for protrusio acetabuli.  Subsequently pt has developed a pressure ulcer which had been unstageable and thought to possibly be a fistula until the wound physician came today and did a sharp debridement on the necrotic tissue and discovered that Sacral wound is a stage 4 pressure ulcer per facility nursing.  Pt had rectal cancer, had radiation and subsequently this pushed the femoral bone (invading and destroying some of the bone) leading to a perforated viscus.  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  She gets long acting Morphine 30 mg TID and OxyCodone 15 mg  Q 3 hours prn for breakthrough. Pain can decrease to a 5 but she states she had better pain relief post op at [facility] with Dilaudid PCA pump.  Movement is excruciating even with premedication and staff states it takes extra time for even small movements when she has been premedicated.  Can have sharp pains in hip and pressure ulcer.  Wound physician debrided her wound today.  She tries to increase protein but doesn't do gluten and has a lactose intolerance with a colostomy. Has stood some working with PT.  [place] room 116.  Has lactose and gluten allergies.  Pharmacy is [facility] out of [place], [place].  Son Marie Levy is who pt says she would like to speak for her if she can't speak for herself.  She does have a daughter as well but states she lives out of state. Nurse states wound doc ordered a low flow air mattress to be delivered to the facility tomorrow as pt is unable to offload her sacrum with her recent hip surgery.   New orders for wound care are to use Santyl in center to debride and Dakin's and then cover with ABD daily. Nursing states pt is getting prn meds Q 3 hours for breakthrough and still has to move very slowly even with premedication. 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 Pt has been being treated  for UTI with Augmentin which will go until 12/2022. She is also receiving Lovenox 40 mg [place] daily. Currently pt is incontinent of bladder due to inability to ambulate to the bathroom but is aware of when she needs to go.  She has colostomy.  Per nursing she requires assistance with bathing, dressing, toileting.  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    History obtained from review of EMR, discussion with primary team, and interview with facility staff and/or Ms. Zaragoza.   Records on Care Everywhere from [facility] labs and imaging: Component 11/14/22 11/13/22 11/12/22 11/11/22 11/10/22 11/09/22  Sodium 132 Low  134 Low  132 Low  132 Low  135 133 Low   Potassium 3.9 3.9 3.8 3.7 3.6 3.9  Chloride 102 103 102 102 107 102  Carbon Dioxide (CO2) _0 Urea Nitrogen (  BUN) 3 Low  _0 Creatinine 0.4 0.3 Low  0.3 Low  0.3 Low  0.3 Low  0.5  Glucose 97  104  86  110  115  86   Calcium 7.3 Low  7.5 Low  7.3 Low  7.0 Low  7.1 Low  7.0 Low   AST (Aspartate Aminotransferase) _1 ALT (Alanine Aminotransferase) _2 Low  10 9 Low  12  Bilirubin, Total 0.3 Low  0.2 Low  0.2 Low  0.6 0.4 0.6  Alk Phos (Alkaline Phosphatase) 75 79 80 84 76 71  Albumin 1.5 Low  1.3 Low  1.3 Low  1.4 Low  1.4 Low  1.5 Low   Protein, Total 5.1 Low  4.7 Low  4.7 Low  5.4 Low  4.9 Low  4.9 Low   Anion Gap _3 Low  9  BUN/CREA Ratio 8 33 High  30 High  27 30 High  22  Glomerular Filtration Rate (eGFR) 101  109  109  109  109     Component 11/14/22 11/13/22 11/12/22 11/11/22 11/10/22 11/09/22  Magnesium 1.8 1.8 1.9 1.9 2.0    Component 11/14/22 11/13/22 11/12/22 11/11/22 11/10/22 11/10/22  WBC (White Blood Cell Count) 9.5 11.4 High  13.5 High  11.5 High  10.6  High  --  Hemoglobin 8.9 Low  8.6 Low  8.4 Low  8.9 Low  7.4 Low  --  Hematocrit 29.3 Low  28.0 Low  28.1 Low  29.1 Low  24.6 Low  --  Platelets 421 354 334 312 308 --  MCV (Mean Corpuscular Volume) 81 81 81 80 79 Low  --  MCH (Mean Corpuscular Hemoglobin) 24.6 Low  24.8 Low  24.3 Low  24.6 Low  23.9 Low  --  MCHC (Mean Corpuscular Hemoglobin Concentration) 30.4 Low  30.7 Low  29.9 Low  30.6 Low  30.1 Low  --  RBC (Red Blood Cell Count) 3.62 Low  3.47 Low  3.46 Low  3.62 Low  3.10 Low  --  RDW-CV (Red Cell Distribution Width) 21.2 High  21.2 High  20.8 High  20.1 High  21.2 High  --  NRBC (Nucleated Red Blood Cell Count) 0.00 0.00 0.00 0.00 0.00 --  NRBC % (Nucleated Red Blood Cell %) 0.0 0.0 0.0 0.0 0.0 --  MPV (Mean Platelet Volume) 9.1 9.4 9.6 9.1 9.2 --  Neutrophil Count 6.3 8.2 10.0 High  7.7 -- --  Neutrophil % 66.6 71.6 74.4 66.9 -- --  Lymphocyte Count 1.8 1.8 1.7 2.0 -- --  Lymphocyte % 18.5 15.3 12.6 17.1 -- 9 Low   Monocyte Count 1.1 High  1.1 High  1.3 High  1.5 High  -- --  Monocyte % 11.1 9.3 9.7 13.0 High  -- 8  Eosinophil Count 0.09 0.15 0.12 0.08 -- --  Eosinophil % 0.9 1.3 0.9 0.7 -- --  Basophil Count 0.05 0.05 0.07 0.07 -- --  Basophil % 0.5 0.4 0.5 0.6 -- --  Immature Granulocyte Count 0.23 High  0.24 High  0.25 High  0.20 High  -- --  Immature Granulocyte % 2.4 High  2.1 High  1.9 High  1.7 High  --    Component 11/14/22 11/13/22 11/12/22 11/11/22 11/10/22 11/10/22  WBC (White Blood Cell Count) 9.5 11.4 High  13.5 High  11.5 High  10.6 High  --  Hemoglobin 8.9 Low  8.6 Low  8.4 Low  8.9 Low  7.4 Low  --  Hematocrit 29.3 Low  28.0 Low  28.1 Low  29.1 Low  24.6 Low  --  Platelets 421 354 334 312 308 --  MCV (Mean Corpuscular Volume) 81 81 81 80 79 Low  --  MCH (Mean Corpuscular Hemoglobin) 24.6 Low  24.8 Low  24.3 Low  24.6 Low  23.9 Low  --  MCHC (Mean Corpuscular Hemoglobin Concentration) 30.4 Low  30.7 Low  29.9 Low  30.6 Low  30.1 Low  --  RBC (Red Blood Cell  Count) 3.62 Low  3.47 Low  3.46 Low  3.62 Low  3.10 Low  --  RDW-CV (Red Cell Distribution Width) 21.2 High  21.2 High  20.8 High  20.1 High  21.2 High  --  NRBC (Nucleated Red Blood Cell Count) 0.00 0.00 0.00 0.00 0.00 --  NRBC % (Nucleated Red Blood Cell %) 0.0 0.0 0.0 0.0 0.0 --  MPV (Mean Platelet Volume) 9.1 9.4 9.6 9.1 9.2 --  Neutrophil Count 6.3 8.2 10.0 High  7.7 -- --  Neutrophil % 66.6 71.6 74.4 66.9 -- --  Lymphocyte Count 1.8 1.8 1.7 2.0 -- --  Lymphocyte % 18.5 15.3 12.6 17.1 -- 9 Low   Monocyte Count 1.1 High  1.1 High  1.3 High  1.5 High  -- --  Monocyte % 11.1 9.3 9.7 13.0 High  -- 8  Eosinophil Count 0.09 0.15 0.12 0.08 -- --  Eosinophil % 0.9 1.3 0.9 0.7 -- --  Basophil Count 0.05 0.05 0.07 0.07 -- --  Basophil % 0.5 0.4 0.5 0.6 -- --  Immature Granulocyte Count 0.23 High  0.24 High  0.25 High  0.20 High  -- --  Immature Granulocyte % 2.4 High  2.1 High  1.9 High  1.7 High  --    Xray left hip 2-3 views with or without pelvis 10/16/22: Findings/Impression:   Bones: Chronic fracture deformities of the left inferior pubic ramus. Known destructive lytic lesion and associated fractures involving the left acetabulum are poorly characterized due to underpenetration and overlying soft tissues.  Alignment: Proximal migration of the left femur relative to the expected region of the acetabulum with likely partial collapse of the superolateral weightbearing femoral head  Joint spaces: Lower lumbar spondylosis. Mild joint space narrowing the right hip.  Soft tissues: Visualized soft tissues are unremarkable.   10/23/22 CT occult GI bleed incl dual abd pelvis w/wo contrast: CT abdomen and pelvis without and with IV contrast   Comparison: CT 08/21/2022.   Indication: GI bleed, C20 Malignant neoplasm of rectum (CMS-HCC).   Technique: A bowel hemorrhage protocol CT of the abdomen and pelvis was  performed. CT imaging of the abdomen and pelvis was performed before the   administration of contrast, and in the arterial and venous phases after the  administration of IV contrast.  Iodinated contrast was used due to the  indications for the examination, to improve disease detection and to  further define anatomy. Coronal and sagittal images were produced from the  axial data set and reviewed as part of the interpretation.   Findings:  - Lower Thorax: Unchanged 5 mm solid nodule partially visualized in the  right lower lobe (11/1). Similar subcentimeter left lower lobe pulmonary  nodule. No pleural or pericardial effusions.   - Liver: Normal in morphology and enhancement.  Unchanged hepatic cysts. No  suspicious hepatic masses are identified.  The portal and hepatic veins are  patent.   - Biliary and Gallbladder:  No intrahepatic or extrahepatic bile duct  dilatation. The gallbladder is normal in appearance.   - Spleen: Normal in appearance.    - Pancreas: Normal in appearance.   - Adrenal Glands: Normal in appearance.   - Kidneys: No suspicious renal lesions. No hydronephrosis.   - Abdominal and Pelvic Vasculature: No abdominal aortic aneurysm.   - Gastrointestinal Tract: No evidence of bowel obstruction. Postsurgical  changes of LAR. Similar irregular rectal wall thickening with suggested gas  containing tract extending from the anterior rectal wall into the region of  the cervix, new from prior exam (14/70). Small gas-fluid collection in the  presacral space measuring 3.7 x 1.6 cm, new from prior exam (11/83). Small  amount of hyperdense material within this collection without definite  evidence of associated enhancement or active extravasation in this region  (5/86). Large colonic stool burden. Previously mentioned enhancing mass in  the pelvis is favored to represent retroflexed uterine body with irregular  appearance of the endometrium in the fundus. Irregularly enhancing soft  tissue extending along the vagina.   -  Peritoneum/Mesentery/Retroperitoneum: See gastrointestinal tract section.  No free intraperitoneal air.   - Lymph Nodes: Similar prominent left aortic lymph node measuring 1.0 cm  (11/29).   - Bladder: Normal in appearance.   - Pelvic Organs: Abnormal enhancement of the vagina.   - Body Wall: Ventral hernia containing unobstructed small and large bowel.  Left upper quadrant colostomy.   - Musculoskeletal:  Similar size of the soft tissue mass involving the left  superior pubic ramus measures approximately 7.6 cm (11/87). Increased  displacement of pathologic fractures involving the left acetabulum with  increased superior migration of the left femur with acetabular protrusio  and partial collapse of the superolateral left femoral head, new from prior  exam. Similar pathologic fracture through the left superior pubic ramus and  chronic fractures of the inferior left pubic ramus.   Impression:  1. Suspected contained perforation of the [facility] pouch with increased size of a presacral gas-fluid collection. Hyperdense material within the presacral collection may represent blood products without evidence of active extravasation. Similar irregular rectal wall thickening suspicious for malignancy.  2.  Apparent small communication between the aforementioned presacral fluid collection and the cervix concerning for possible developing fistula.  3.  Increased displacement of left acetabulum pathologic fractures with new acetabular protrusio and partial collapse of the left femoral head.   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 IV contrast was administered  to improve disease detection and further define anatomy.   Premedication/adverse events:  None.   Findings:   Postoperative changes of low anterior resection with Hartman pouch. Within  the [place] pouch, adjacent to and involving the internal sphincter  complex, there is abnormal T2 signal, concerning for viable neoplasm  (series 13, image 30). No mediastinal rectal or pelvic sidewall  lymphadenopathy.   The Hartman pouch apex is dehiscent and there is a 5.3 x 3.6  cm complex  fluid collection within the presacral space (series 13, image 17). This  collection communicates with the posterior vaginal fornix and there is  abnormal fluid signal extending into the vagina at this level (series 13,  image 20). There may be another smaller rectovaginal fistula (series 13,  image 33).   The uterus is retroflexed and there is fluid within the endometrial canal  which measures up to 0.5 cm (series 8, image 27). Junctional zone is  normal. No adnexal masses.   Large destructive osseous lesion involving the left superior pubic ramus  with pathologic fractures of the left acetabulum and left femur again  identified.   Impression:  1. 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  2. Osseous metastatic disease with pathologic fractures of the left pelvis  and hip, similar to prior.  3.  Residual viable rectal neoplasm, decreased from prior.   I reviewed EMR for available labs, medications, imaging, studies and related documents.  No new records since last visit/Records reviewed and summarized above.   ROS General: NAD Cardiovascular: denies chest pain, denies DOE Pulmonary: denies cough,  denies increased SOB Abdomen: endorses good appetite with attention to protein and use  denies constipation notes colostomy placed in 2020 GU: denies dysuria, endorses incontinence of urine due to inability to get to the bathroom in time MSK:  endorses increased weakness and pain not well controlled, no falls reported Skin: endorses wound on sacram with sharp debiridement today by wound care physician Neurological: endorse pain at best a 5, denies insomnia Psych: Endorses positive mood Heme/lymph/immuno: denies bruises, abnormal bleeding  Physical Exam: Current and past weights: 96.5 lbs last week, 122 lbs on 10/23/22 Constitutional: NAD, non-toxic General: frail appearing, thin  ENMT: intact hearing, oral mucous membranes moist, dentition intact CV: S1S2, RRR, no LE edema, wearing bunny boot on left foot has some Mepilex to heels and lateral right foot Pulmonary: CTAB, no increased work of breathing, no cough, room air Abdomen: intake 50% of lunch today, normo-active BS + 4 quadrants, noted left side abdominal mass inferior to ostomy and unclear if this is a hernia or mass or both GU: deferred MSK: noted sarcopenia affecting upper limbs more, moves all extremities, stand assist with routine Skin: warm and dry, description of wound by wound physician post debridement today is a stage 4 Neuro:  noted generalized weakness, no cognitive impairment but some mild sedation Psych: non-anxious affect, Drowsy but O x 3 Hem/lymph/immuno: no widespread bruising  CURRENT PROBLEM LIST:  Patient Active Problem List   Diagnosis Date Noted   Genetic testing 12/06/2021   Family history of colon cancer 11/14/2021   Family history of melanoma 11/14/2021   Family history of lung cancer 11/14/2021   Iron deficiency anemia due to chronic blood loss 04/19/2019   Rectal cancer ([place]) 02/24/2019   Perforated viscus 02/20/2019   PAST MEDICAL HISTORY:  Active Ambulatory Problems    Diagnosis Date Noted    Perforated viscus 02/20/2019   Rectal cancer ([place]) 02/24/2019   Iron deficiency anemia due to chronic blood loss 04/19/2019   Family history of colon cancer 11/14/2021   Family history of melanoma 11/14/2021   Family history of lung cancer 11/14/2021   Genetic testing 12/06/2021   Resolved Ambulatory Problems    Diagnosis Date Noted   No Resolved Ambulatory Problems   Past Medical History:  Diagnosis Date   Thyroid disease    SOCIAL HX:  Social History   Tobacco Use  Smoking status: Never   Smokeless tobacco: Never  Substance Use Topics   Alcohol use: Not Currently   FAMILY HX:  Family History  Problem Relation Age of Onset   Colon cancer Mother 18   Melanoma Father        dx 15s   Cancer Brother        lung cancer    Cancer Maternal Uncle        unknown type cancer    Cancer Maternal Uncle        unknown type cancer        Preferred Pharmacy: ALLERGIES: No Known Allergies   PERTINENT MEDICATIONS:  Outpatient Encounter Medications as of 11/20/2022  Medication Sig   Ascorbic Acid (VITAMIN C) 100 MG tablet Take 100 mg by mouth daily.   Beta Carotene (VITAMIN A) 25000 UNIT capsule Take 25,000 Units by mouth daily.   Digestive Enzyme CAPS Take 1 capsule by mouth daily.   ELDERBERRY PO Take by mouth.   ferrous sulfate 325 (65 FE) MG tablet Take 27 mg by mouth daily with breakfast. From [employer]   Flaxseed, Linseed, (FLAXSEED OIL PO) Take by mouth.   Garlic 10 MG CAPS Take by mouth.   magnesium 30 MG tablet Take 500 mg by mouth daily.    NP THYROID 90 MG tablet Take 90 mg by mouth daily. Mon-Fri   Selenium 100 MCG CAPS Take 1 capsule by mouth daily.   UNABLE TO FIND daily. Med Name: Tumero   UNABLE TO FIND Take by mouth daily. Med Name: Aseo   UNABLE TO FIND Take by mouth daily. Med Name: HCL   UNABLE TO FIND Med Name: Quercidin   VITAMIN D PO Take 30 mLs by mouth daily.   zinc gluconate 50 MG tablet Take 50 mg by mouth daily.   No facility-administered  encounter medications on file as of 11/20/2022.     ------------------------------------------------------------------------------------------ Advance Care Planning/Goals of Care: Goals include to maximize quality of life and symptom management so patient can return home independently and recover sufficiently to continue chemotherapy.     Identification  of a healthcare agent -Ahrianna Siglin, son,  [phone], has daughter but she lives out of town.  Indicated she may be looking to have son made [facility] POA. Review of an advance directive document-DNR . Decision not to resuscitate or to de-escalate disease focused treatments due to poor prognosis. CODE STATUS:  DNR   Thank you for the opportunity to participate in the care of Ms. Bierly.  The palliative care team will continue to follow. Please call our office at [phone] if we can be of additional assistance.   Marijo Conception, FNP-C  COVID-19 PATIENT SCREENING TOOL Asked and negative response unless otherwise noted:  Have you had symptoms of covid, tested positive or been in contact with someone with symptoms/positive test in the past 5-10 days? unknown

## 2022-11-21 ENCOUNTER — Telehealth: Payer: Self-pay | Admitting: Family Medicine

## 2022-11-22 ENCOUNTER — Telehealth: Payer: Self-pay | Admitting: Family Medicine

## 2022-11-22 NOTE — Telephone Encounter (Signed)
Nurse at rehab facility [name] had called yesterday and said that pt was hurting despite taking the Dilaudid Q 2 hours and was rating her pain an 8 on a 10 scale and wasn't getting to a 5 level as before. Adjustments made yesterday to give 8 mg po Dilaudid loading dose then decrease interval frequency to 4 mg po Q 2 hrs prn for break through pain.  TCT [name], nurse at [facility] rehab today to check on medication effectiveness.  Elmyra Ricks states that the 8 mg load got pt comfortable and she was a little too sleepy at the next opportunity to take Dilaudid so a prn dose was not given around 6 pm and pt tolerated well with improved pain relief.  The day prior to yesterday pt had sharp debridement of sacral decub and was noted to be stage IV.  Georges Lynch that I would send prescription for Gabapentin 100 mg po BID to [facility] to be started on receipt.  [name] FNP-C

## 2022-11-22 NOTE — Telephone Encounter (Signed)
Returning voice mail to [name], nurse caring for Marie Levy.  Pt was changed in the am today to Dilaudid 4 mg po Q 3 hrs prn for breakthrough pain from OxyCodone 10 mg Q 3 hrs prn that she has been taking Q 3 hrs. Pt had indicated yesterday she thought she had better relief from Dilaudid than OxyCodone. Marie Levy is stting that pain is sitting at an 8/10 scale where it had been going down to 5.  Of note, pt had sharp debridement of sacral wound yesterday by wound care physician from unstageable to stage IV sacral decubitus.  Pt gets schedule Morphine long acting 30 mg TID.  Orders given to give an 8 mg Dilaudid loading dose now then decrease to 4 mg Q 2 hrs prn for breakthrough pain.  Will follow up in am to see how pain is and likely add Gabapentin 100 mg TID for additional pain relief.  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  She is actively attempting to rehab following her Girdlestone procedure in late October 23, 2022.  [name] FNP-C

## 2022-12-04 ENCOUNTER — Encounter (HOSPITAL_COMMUNITY): Payer: Self-pay

## 2022-12-04 ENCOUNTER — Emergency Department (HOSPITAL_COMMUNITY)
Admission: EM | Admit: 2022-12-04 | Discharge: 2022-12-04 | Disposition: A | Discharge status: Other Acute Inpatient Hospital | Payer: PPO | Attending: Emergency Medicine | Admitting: Emergency Medicine

## 2022-12-04 ENCOUNTER — Emergency Department (HOSPITAL_COMMUNITY): Payer: PPO

## 2022-12-04 DIAGNOSIS — L0231 Cutaneous abscess of buttock: Secondary | ICD-10-CM | POA: Diagnosis present

## 2022-12-04 DIAGNOSIS — D72829 Elevated white blood cell count, unspecified: Secondary | ICD-10-CM | POA: Insufficient documentation

## 2022-12-04 DIAGNOSIS — M869 Osteomyelitis, unspecified: Secondary | ICD-10-CM | POA: Diagnosis not present

## 2022-12-04 DIAGNOSIS — Z85048 Personal history of other malignant neoplasm of rectum, rectosigmoid junction, and anus: Secondary | ICD-10-CM | POA: Diagnosis not present

## 2022-12-04 DIAGNOSIS — L98429 Non-pressure chronic ulcer of back with unspecified severity: Secondary | ICD-10-CM | POA: Diagnosis not present

## 2022-12-04 DIAGNOSIS — D649 Anemia, unspecified: Secondary | ICD-10-CM | POA: Insufficient documentation

## 2022-12-04 DIAGNOSIS — M4628 Osteomyelitis of vertebra, sacral and sacrococcygeal region: Secondary | ICD-10-CM

## 2022-12-04 DIAGNOSIS — R000 Tachycardia, unspecified: Secondary | ICD-10-CM | POA: Insufficient documentation

## 2022-12-04 LAB — CBC WITH DIFFERENTIAL/PLATELET
Abs Immature Granulocytes: 0.39 10*3/uL — ABNORMAL HIGH (ref 0.00–0.07)
Basophils Absolute: 0.1 10*3/uL (ref 0.0–0.1)
Basophils Relative: 0 %
Eosinophils Absolute: 0 10*3/uL (ref 0.0–0.5)
Eosinophils Relative: 0 %
HCT: 25.5 % — ABNORMAL LOW (ref 36.0–46.0)
Hemoglobin: 7.6 g/dL — ABNORMAL LOW (ref 12.0–15.0)
Immature Granulocytes: 2 %
Lymphocytes Relative: 12 %
Lymphs Abs: 2.9 10*3/uL (ref 0.7–4.0)
MCH: 24.4 pg — ABNORMAL LOW (ref 26.0–34.0)
MCHC: 29.8 g/dL — ABNORMAL LOW (ref 30.0–36.0)
MCV: 81.7 fL (ref 80.0–100.0)
Monocytes Absolute: 2.1 10*3/uL — ABNORMAL HIGH (ref 0.1–1.0)
Monocytes Relative: 8 %
Neutro Abs: 19.1 10*3/uL — ABNORMAL HIGH (ref 1.7–7.7)
Neutrophils Relative %: 78 %
Platelets: 702 10*3/uL — ABNORMAL HIGH (ref 150–400)
RBC: 3.12 MIL/uL — ABNORMAL LOW (ref 3.87–5.11)
RDW: 20.3 % — ABNORMAL HIGH (ref 11.5–15.5)
WBC: 24.5 10*3/uL — ABNORMAL HIGH (ref 4.0–10.5)
nRBC: 0 % (ref 0.0–0.2)

## 2022-12-04 LAB — COMPREHENSIVE METABOLIC PANEL
ALT: 12 U/L (ref 0–44)
AST: 22 U/L (ref 15–41)
Albumin: 1.9 g/dL — ABNORMAL LOW (ref 3.5–5.0)
Alkaline Phosphatase: 98 U/L (ref 38–126)
Anion gap: 8 (ref 5–15)
BUN: 15 mg/dL (ref 8–23)
CO2: 25 mmol/L (ref 22–32)
Calcium: 7.8 mg/dL — ABNORMAL LOW (ref 8.9–10.3)
Chloride: 96 mmol/L — ABNORMAL LOW (ref 98–111)
Creatinine, Ser: 0.32 mg/dL — ABNORMAL LOW (ref 0.44–1.00)
GFR, Estimated: >60 mL/min (ref >60)
Glucose, Bld: 98 mg/dL (ref 70–99)
Potassium: 4.3 mmol/L (ref 3.5–5.1)
Sodium: 129 mmol/L — ABNORMAL LOW (ref 135–145)
Total Bilirubin: 0.4 mg/dL (ref 0.3–1.2)
Total Protein: 6.4 g/dL — ABNORMAL LOW (ref 6.5–8.1)

## 2022-12-04 LAB — LACTIC ACID, PLASMA
Lactic Acid, Venous: 2 mmol/L (ref 0.5–1.9)
Lactic Acid, Venous: 1.1 mmol/L (ref 0.5–1.9)

## 2022-12-04 MED ORDER — LACTATED RINGERS IV BOLUS
1000.0000 mL | Freq: Once | INTRAVENOUS | Status: AC
  Administered 2022-12-04: 1000 mL via INTRAVENOUS

## 2022-12-04 MED ORDER — HYDROMORPHONE HCL 1 MG/ML IJ SOLN
1.0000 mg | INTRAMUSCULAR | Status: DC | PRN
  Administered 2022-12-04: 1 mg via INTRAVENOUS
  Filled 2022-12-04: qty 1

## 2022-12-04 MED ORDER — IOHEXOL 300 MG/ML  SOLN
100.0000 mL | Freq: Once | INTRAMUSCULAR | Status: AC | PRN
  Administered 2022-12-04: 100 mL via INTRAVENOUS

## 2022-12-04 MED ORDER — HYDROMORPHONE HCL 1 MG/ML IJ SOLN
1.0000 mg | Freq: Once | INTRAMUSCULAR | Status: AC
  Administered 2022-12-04: 1 mg via INTRAVENOUS
  Filled 2022-12-04: qty 1

## 2022-12-04 MED ORDER — VANCOMYCIN HCL IN DEXTROSE 1-5 GM/200ML-% IV SOLN
1000.0000 mg | Freq: Once | INTRAVENOUS | Status: AC
  Administered 2022-12-04: 1000 mg via INTRAVENOUS
  Filled 2022-12-04: qty 200

## 2022-12-04 MED ORDER — PIPERACILLIN-TAZOBACTAM 3.375 G IVPB 30 MIN
3.3750 g | Freq: Once | INTRAVENOUS | Status: AC
  Administered 2022-12-04: 3.375 g via INTRAVENOUS
  Filled 2022-12-04: qty 50

## 2022-12-04 MED ORDER — SODIUM CHLORIDE 0.9 % IV BOLUS
1000.0000 mL | Freq: Once | INTRAVENOUS | Status: AC
  Administered 2022-12-04: 1000 mL via INTRAVENOUS

## 2022-12-04 MED ORDER — SODIUM CHLORIDE (PF) 0.9 % IJ SOLN
INTRAMUSCULAR | Status: AC
  Filled 2022-12-04: qty 50

## 2022-12-04 NOTE — ED Notes (Addendum)
Report given to Romero Belling, RN at [facility] ED. Per Network engineer, [facility] manager with [facility] has been contacted for transport, will contact [facility] as well and use earliest available.

## 2022-12-04 NOTE — ED Triage Notes (Signed)
Pt arrives via [facility] from [facility] facility for an abscess to her L thigh. Pt has hx of rectal cancer and large pressure wound to sacrum.

## 2022-12-04 NOTE — ED Provider Notes (Signed)
[facility] DEPT Provider Note   CSN: [id number] Arrival date & time: 12/04/22  1418     History {Add pertinent medical, surgical, social history, OB history to HPI:1} Chief Complaint  Patient presents with   Abscess    Marie Levy is a 77 y.o. female.  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  Patient only able to give limited history.  Reports that she has a sacral wound that has been chronic and that recently she started developing pain and redness of her left proximal thigh.  Denies any fevers.  No chest pain, cough, shortness of breath, runny nose or sore throat.  No dysuria or frequency.  Denies any nausea, vomiting, or diarrhea. Stays at [facility]. Has had some decreased ostomy output but no blood.   Additional hx obtained via son [name]. Has a wound care doctor at [facility] that has been caring for her stage 4 pressure ulcer. Today when she was checked they noticed that she has an abscess in her L groin. Says that she had a uti recently.       Home Medications Prior to Admission medications   Medication Sig Start Date End Date Taking? Authorizing Provider  amoxicillin-clavulanate (AUGMENTIN) 875-125 MG tablet Take 1 tablet by mouth 2 (two) times daily.  12/09/22  [provider]  Ascorbic Acid (VITAMIN C) 100 MG tablet Take 100 mg by mouth daily.    [provider]  Beta Carotene (VITAMIN A) 25000 UNIT capsule Take 25,000 Units by mouth daily.    [provider]  Cholecalciferol (VITAMIN D3) 1.25 MG (50000 UT) CAPS Take 1 tablet by mouth once a week.    [provider]  Digestive Enzyme CAPS Take 1 capsule by mouth daily.    [provider]  docusate sodium (COLACE) 100 MG capsule  Take 100 mg by mouth 2 (two) times daily.    [provider]  ELDERBERRY PO Take by mouth.    [provider]  enoxaparin (LOVENOX) 40 MG/0.4ML injection Inject 40 mg into the skin daily.  12/13/22  [provider]  ferrous sulfate 325 (65 FE) MG tablet Take 27 mg by mouth daily with breakfast. From [facility]    [provider]  Flaxseed, Linseed, (FLAXSEED OIL PO) Take by mouth.    [provider]  Garlic 10 MG CAPS Take by mouth.    [provider]  leptospermum manuka honey (MEDIHONEY) PSTE paste Apply 1 Application topically every other day. And prn    [provider]  magnesium 30 MG tablet Take 500 mg by mouth daily.     [provider]  melatonin 3 MG TABS tablet Take 3 mg by mouth at bedtime.    [provider]  morphine (MSIR) 30 MG tablet Take 30 mg by mouth 3 (three) times daily.    [provider]  NP THYROID 90 MG tablet Take 60 mg by mouth 2 (two) times daily. Mon-Fri 01/17/19   [provider]  ondansetron (ZOFRAN-ODT) 8 MG disintegrating tablet Take 8 mg by mouth 2 (two) times daily as needed for nausea or vomiting.    [provider]  Oxycodone HCl 10 MG TABS Take 1-1.5 tablets by mouth every 3 (three) hours as needed (chronic pain).    [provider]  polyethylene glycol (MIRALAX / GLYCOLAX) 17 g packet Take 17 g by mouth daily as needed for moderate constipation.    [provider]  prochlorperazine (COMPAZINE) 10 MG tablet Take 10 mg by mouth every 6 (six) hours as needed for nausea or vomiting.    [provider]  Selenium 100 MCG CAPS Take 1 capsule by mouth daily. Patient not taking: Reported on 11/20/2022    [provider]  senna-docusate (SENOKOT-S) 8.6-50 MG tablet Take 2 tablets by mouth 2 (two) times daily as needed for mild constipation.    [provider]  sodium chloride 1 g tablet Take 2 g by mouth 3 (three) times  daily with meals.    [provider]  UNABLE TO FIND daily. Med Name: Norville Haggard Patient not taking: Reported on 11/20/2022    [provider]  UNABLE TO FIND Take by mouth daily. Med Name: Diller Patient not taking: Reported on 11/20/2022    [provider]  UNABLE TO FIND Take by mouth daily. Med Name: HCL Patient not taking: Reported on 11/20/2022    [provider]  UNABLE TO FIND Med Name: Quercidin Patient not taking: Reported on 11/20/2022    [provider]  Urea Consuela Mimes) POWD Take 30 g by mouth daily. For hyponatremia    [provider]  VITAMIN D PO Take 30 mLs by mouth daily. Patient not taking: Reported on 11/20/2022    [provider]  zinc gluconate 50 MG tablet Take 50 mg by mouth daily. Patient not taking: Reported on 11/20/2022    [provider]      Allergies    Patient has no known allergies.    Review of Systems   Review of Systems  Physical Exam Updated Vital Signs BP 99/75   Pulse (!) 122   Temp 98.6 F (37 C) (Oral)   Resp 12   SpO2 99%  Physical Exam Vitals and nursing note reviewed.  Constitutional:      General: She is not in acute distress.    Appearance: She is well-developed.  HENT:     Head: Normocephalic and atraumatic.     Right Ear: External ear normal.     Left Ear: External ear normal.     Nose: Nose normal.  Eyes:     Extraocular Movements: Extraocular movements intact.     Conjunctiva/sclera: Conjunctivae normal.     Pupils: Pupils are equal, round, and reactive to light.  Cardiovascular:     Rate and Rhythm: Normal rate and regular rhythm.     Heart sounds: No murmur heard. Pulmonary:     Effort: Pulmonary effort is normal. No respiratory distress.     Breath sounds: Normal breath sounds.  Abdominal:     General: Abdomen is flat. There is no distension.     Palpations: Abdomen is soft. There is no mass.     Tenderness: There is no abdominal tenderness. There  is no guarding.     Comments: Ostomy with brown stool  Genitourinary:    Comments: Chaperoned by tech.  Stage IV sacral ulcer.  Surrounding erythema.  Minimal amount of drainage.  Induration and erythema along proximal L thigh and labia.  No crepitance. Musculoskeletal:     Cervical back: Normal range of motion and neck supple.     Right lower leg: No edema.     Left lower leg: No edema.  Skin:    General: Skin is warm and dry.  Neurological:     Mental Status: She is alert. Mental status is at baseline.     Comments: Alert and oriented to self and place.  Thought it was January 2023.  Psychiatric:        Mood and Affect: Mood normal.     ED Results / Procedures / Treatments   Labs (all labs ordered are listed, but only abnormal results are displayed) Labs Reviewed  CULTURE, BLOOD (ROUTINE X 2)  CULTURE, BLOOD (ROUTINE X 2)  LACTIC ACID, PLASMA  LACTIC ACID, PLASMA  COMPREHENSIVE METABOLIC PANEL  CBC WITH DIFFERENTIAL/PLATELET  URINALYSIS, ROUTINE W REFLEX MICROSCOPIC    EKG None  Radiology No results found.  Procedures Procedures  {Document cardiac monitor, telemetry assessment procedure when appropriate:1}  Medications Ordered in ED Medications  lactated ringers bolus 1,000 mL (has no administration in time range)  vancomycin (VANCOCIN) IVPB 1000 mg/200 mL premix (has no administration in time range)  piperacillin-tazobactam (ZOSYN) IVPB 3.375 g (has no administration in time range)    ED Course/ Medical Decision Making/ A&P Clinical Course as of 12/04/22 1620  Mon Dec 04, 2022  1620 Hemoglobin(!): 7.6 8.9 on 11/14/22 [RP]    Clinical Course User Index [RP] Fransico Meadow, MD                           Medical Decision Making Amount and/or Complexity of Data Reviewed Labs: ordered. Decision-making details documented in ED Course. Radiology: ordered.  Risk Prescription drug management. Decision regarding hospitalization.   ***  {Document  critical care time when appropriate:1} {Document review of labs and clinical decision tools ie heart score, Chads2Vasc2 etc:1}  {Document your independent review of radiology images, and any outside records:1} {Document your discussion with family members, caretakers, and with consultants:1} {Document social determinants of health affecting pt's care:1} {Document your decision making why or why not admission, treatments were needed:1} Final Clinical Impression(s) / ED Diagnoses Final diagnoses:  None    Rx / DC Orders ED Discharge Orders     None

## 2022-12-04 NOTE — Progress Notes (Signed)
A consult was received from an ED physician for vancomycin per pharmacy dosing.  The patient's profile has been reviewed for ht/wt/allergies/indication/available labs.   A one time order has been placed for vancomycin 1g.  Further antibiotics/pharmacy consults should be ordered by admitting physician if indicated.                       Thank you, Peggyann Juba, PharmD, BCPS 12/04/2022  3:46 PM

## 2022-12-04 NOTE — ED Provider Notes (Signed)
  Physical Exam  BP 104/65   Pulse (!) 110   Temp 98.4 F (36.9 C) (Oral)   Resp 12   SpO2 98%   Physical Exam  Procedures  Procedures  ED Course / MDM   Clinical Course as of 12/04/22 1921  Mon Dec 04, 2022  1620 Hemoglobin(!): 7.6 8.9 on 11/14/22 [RP]  1810 Signed out to Dr Darl Householder.  [RP]    Clinical Course User Index [RP] Fransico Meadow, MD   Medical Decision Making Care assumed at 6 PM.  Patient has metastatic rectal cancer with rectovaginal fistula with recurrent abscesses.  Patient recently had left hip surgery and has been managed with wound care.  Patient is here with left inguinal swelling and pain.  White blood cell count is 24,000.  Patient is also tachycardic and borderline hypotensive.  Signed out pending CT abdomen pelvis  6:15 pm CT showed extensive left gluteal fluid measuring 5 x 2 cm.  Patient also has sacral decub ulcer with surrounding osteomyelitis.  Patient also has foci of gas of the left hip joint.  Patient was recently managed at [facility] and all her specialists are at [facility].  I reached out to the [facility] transfer center  7:24 PM I discussed case with Dr. Thurnell Garbe, oncologist at [facility].  She states that since patient has been followed there, she will accept the patient upon transfer.  Patient will be transferred ED to ED to the main [facility].  Stable for transfer  CRITICAL CARE Performed by: Wandra Arthurs   Total critical care time: 30 minutes  Critical care time was exclusive of separately billable procedures and treating other patients.  Critical care was necessary to treat or prevent imminent or life-threatening deterioration.  Critical care was time spent personally by me on the following activities: development of treatment plan with patient and/or surrogate as well as nursing, discussions with consultants, evaluation of patient's response to treatment, examination of patient, obtaining history from patient or surrogate, ordering and performing treatments and  interventions, ordering and review of laboratory studies, ordering and review of radiographic studies, pulse oximetry and re-evaluation of patient's condition.   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 Decision-making details documented in ED Course. Radiology: ordered and independent interpretation performed. Decision-making details documented in ED Course.  Risk Prescription drug management. Decision regarding hospitalization.          Drenda Freeze, MD 12/04/22 [phone]

## 2022-12-10 LAB — CULTURE, BLOOD (ROUTINE X 2)
Culture: NO GROWTH
Culture: NO GROWTH

## 2023-01-04 DEATH — deceased
